# Patient Record
Sex: Female | Born: 1990 | Hispanic: No | Marital: Single | Smoking: Never smoker
Health system: Southern US, Community
[De-identification: ages and names within clinical notes are randomized; demographics above are authoritative.]

## PROBLEM LIST (undated history)

## (undated) ENCOUNTER — Inpatient Hospital Stay (HOSPITAL_COMMUNITY): Payer: Self-pay

## (undated) DIAGNOSIS — R112 Nausea with vomiting, unspecified: Secondary | ICD-10-CM

## (undated) DIAGNOSIS — J45909 Unspecified asthma, uncomplicated: Secondary | ICD-10-CM

## (undated) DIAGNOSIS — D649 Anemia, unspecified: Secondary | ICD-10-CM

## (undated) DIAGNOSIS — D6800 Von Willebrand disease, unspecified: Secondary | ICD-10-CM

## (undated) DIAGNOSIS — T8859XA Other complications of anesthesia, initial encounter: Secondary | ICD-10-CM

## (undated) DIAGNOSIS — F419 Anxiety disorder, unspecified: Secondary | ICD-10-CM

## (undated) DIAGNOSIS — S0990XA Unspecified injury of head, initial encounter: Secondary | ICD-10-CM

## (undated) DIAGNOSIS — Z9889 Other specified postprocedural states: Secondary | ICD-10-CM

## (undated) DIAGNOSIS — T4145XA Adverse effect of unspecified anesthetic, initial encounter: Secondary | ICD-10-CM

## (undated) DIAGNOSIS — Z8619 Personal history of other infectious and parasitic diseases: Secondary | ICD-10-CM

## (undated) HISTORY — DX: Von Willebrand's disease: D68.0

## (undated) HISTORY — DX: Von Willebrand disease, unspecified: D68.00

## (undated) HISTORY — DX: Personal history of other infectious and parasitic diseases: Z86.19

---
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
## 2013-11-12 ENCOUNTER — Emergency Department (HOSPITAL_COMMUNITY)
Admission: EM | Admit: 2013-11-12 | Discharge: 2013-11-13 | Disposition: A | Discharge status: Home / Self Care | Payer: BC Managed Care – PPO | Attending: Emergency Medicine | Admitting: Emergency Medicine

## 2013-11-12 ENCOUNTER — Encounter (HOSPITAL_COMMUNITY): Payer: Self-pay | Admitting: Emergency Medicine

## 2013-11-12 ENCOUNTER — Emergency Department (HOSPITAL_COMMUNITY): Payer: BC Managed Care – PPO

## 2013-11-12 DIAGNOSIS — J069 Acute upper respiratory infection, unspecified: Secondary | ICD-10-CM | POA: Insufficient documentation

## 2013-11-12 DIAGNOSIS — R5383 Other fatigue: Secondary | ICD-10-CM

## 2013-11-12 DIAGNOSIS — Z3202 Encounter for pregnancy test, result negative: Secondary | ICD-10-CM | POA: Insufficient documentation

## 2013-11-12 DIAGNOSIS — R000 Tachycardia, unspecified: Secondary | ICD-10-CM | POA: Insufficient documentation

## 2013-11-12 DIAGNOSIS — J45909 Unspecified asthma, uncomplicated: Secondary | ICD-10-CM | POA: Insufficient documentation

## 2013-11-12 DIAGNOSIS — Z79899 Other long term (current) drug therapy: Secondary | ICD-10-CM | POA: Insufficient documentation

## 2013-11-12 DIAGNOSIS — R0789 Other chest pain: Secondary | ICD-10-CM

## 2013-11-12 DIAGNOSIS — R071 Chest pain on breathing: Secondary | ICD-10-CM | POA: Insufficient documentation

## 2013-11-12 DIAGNOSIS — R5381 Other malaise: Secondary | ICD-10-CM | POA: Insufficient documentation

## 2013-11-12 HISTORY — DX: Unspecified asthma, uncomplicated: J45.909

## 2013-11-12 LAB — CBC
HEMATOCRIT: 35 % — AB (ref 36.0–46.0)
HEMOGLOBIN: 11.9 g/dL — AB (ref 12.0–15.0)
MCH: 29.8 pg (ref 26.0–34.0)
MCHC: 34 g/dL (ref 30.0–36.0)
MCV: 87.5 fL (ref 78.0–100.0)
Platelets: 297 10*3/uL (ref 150–400)
RBC: 4 MIL/uL (ref 3.87–5.11)
RDW: 12.3 % (ref 11.5–15.5)
WBC: 14.4 10*3/uL — ABNORMAL HIGH (ref 4.0–10.5)

## 2013-11-12 LAB — BASIC METABOLIC PANEL
BUN: 11 mg/dL (ref 6–23)
CO2: 19 meq/L (ref 19–32)
Calcium: 8.6 mg/dL (ref 8.4–10.5)
Chloride: 103 mEq/L (ref 96–112)
Creatinine, Ser: 0.66 mg/dL (ref 0.50–1.10)
GFR calc Af Amer: >90 mL/min (ref >90)
GLUCOSE: 97 mg/dL (ref 70–99)
POTASSIUM: 3.3 meq/L — AB (ref 3.7–5.3)
Sodium: 137 mEq/L (ref 137–147)

## 2013-11-12 LAB — POCT I-STAT TROPONIN I: TROPONIN I, POC: 0.01 ng/mL (ref 0.00–0.08)

## 2013-11-12 LAB — D-DIMER, QUANTITATIVE: D-Dimer, Quant: 0.43 ug/mL-FEU (ref 0.00–0.48)

## 2013-11-12 LAB — POCT PREGNANCY, URINE: Preg Test, Ur: NEGATIVE

## 2013-11-12 MED ORDER — IBUPROFEN 400 MG PO TABS
600.0000 mg | ORAL_TABLET | Freq: Once | ORAL | Status: AC
  Administered 2013-11-12: 600 mg via ORAL
  Filled 2013-11-12 (×2): qty 1

## 2013-11-12 MED ORDER — DEXAMETHASONE 6 MG PO TABS
10.0000 mg | ORAL_TABLET | ORAL | Status: AC
  Administered 2013-11-13: 10 mg via ORAL
  Filled 2013-11-12: qty 1

## 2013-11-12 NOTE — ED Notes (Signed)
Patient returned from XRay

## 2013-11-12 NOTE — ED Notes (Signed)
Pt. reports mid chest pain onset today worse with deep inspiration , denies SOB /nausea or diaphoresis .

## 2013-11-12 NOTE — ED Notes (Signed)
MD at bedside. (ER Physician).  MD updated the patient on the plan of care.

## 2013-11-12 NOTE — Discharge Instructions (Signed)
Take 4 puffs of albuterol every 4 hours for the next 24 hours. If you have worsening symptoms return to the ED. You can take Motrin for pain.  Asthma, Adult Asthma is a recurring condition in which the airways tighten and narrow. Asthma can make it difficult to breathe. It can cause coughing, wheezing, and shortness of breath. Asthma episodes (also called asthma attacks) range from minor to life-threatening. Asthma cannot be cured, but medicines and lifestyle changes can help control it. CAUSES Asthma is believed to be caused by inherited (genetic) and environmental factors, but its exact cause is unknown. Asthma may be triggered by allergens, lung infections, or irritants in the air. Asthma triggers are different for each person. Common triggers include:   Animal dander.  Dust mites.  Cockroaches.  Pollen from trees or grass.  Mold.  Smoke.  Air pollutants such as dust, household cleaners, hair sprays, aerosol sprays, paint fumes, strong chemicals, or strong odors.  Cold air, weather changes, and winds (which increase molds and pollens in the air).  Strong emotional expressions such as crying or laughing hard.  Stress.  Certain medicines (such as aspirin) or types of drugs (such as beta-blockers).  Sulfites in foods and drinks. Foods and drinks that may contain sulfites include dried fruit, potato chips, and sparkling grape juice.  Infections or inflammatory conditions such as the flu, a cold, or an inflammation of the nasal membranes (rhinitis).  Gastroesophageal reflux disease (GERD).  Exercise or strenuous activity. SYMPTOMS Symptoms may occur immediately after asthma is triggered or many hours later. Symptoms include:  Wheezing.  Excessive nighttime or early morning coughing.  Frequent or severe coughing with a common cold.  Chest tightness.  Shortness of breath. DIAGNOSIS  The diagnosis of asthma is made by a review of your medical history and a physical exam.  Tests may also be performed. These may include:  Lung function studies. These tests show how much air you breath in and out.  Allergy tests.  Imaging tests such as X-rays. TREATMENT  Asthma cannot be cured, but it can usually be controlled. Treatment involves identifying and avoiding your asthma triggers. It also involves medicines. There are 2 classes of medicine used for asthma treatment:   Controller medicines. These prevent asthma symptoms from occurring. They are usually taken every day.  Reliever or rescue medicines. These quickly relieve asthma symptoms. They are used as needed and provide short-term relief. Your health care provider will help you create an asthma action plan. An asthma action plan is a written plan for managing and treating your asthma attacks. It includes a list of your asthma triggers and how they may be avoided. It also includes information on when medicines should be taken and when their dosage should be changed. An action plan may also involve the use of a device called a peak flow meter. A peak flow meter measures how well the lungs are working. It helps you monitor your condition. HOME CARE INSTRUCTIONS   Take medicine as directed by your health care provider. Speak with your health care provider if you have questions about how or when to take the medicines.  Use a peak flow meter as directed by your health care provider. Record and keep track of readings.  Understand and use the action plan to help minimize or stop an asthma attack without needing to seek medical care.  Control your home environment in the following ways to help prevent asthma attacks:  Do not smoke. Avoid being exposed to  secondhand smoke.  Change your heating and air conditioning filter regularly.  Limit your use of fireplaces and wood stoves.  Get rid of pests (such as roaches and mice) and their droppings.  Throw away plants if you see mold on them.  Clean your floors and dust  regularly. Use unscented cleaning products.  Try to have someone else vacuum for you regularly. Stay out of rooms while they are being vacuumed and for a short while afterward. If you vacuum, use a dust mask from a hardware store, a double-layered or microfilter vacuum cleaner bag, or a vacuum cleaner with a HEPA filter.  Replace carpet with wood, tile, or vinyl flooring. Carpet can trap dander and dust.  Use allergy-proof pillows, mattress covers, and box spring covers.  Wash bed sheets and blankets every week in hot water and dry them in a dryer.  Use blankets that are made of polyester or cotton.  Clean bathrooms and kitchens with bleach. If possible, have someone repaint the walls in these rooms with mold-resistant paint. Keep out of the rooms that are being cleaned and painted.  Wash hands frequently. SEEK MEDICAL CARE IF:   You have wheezing, shortness of breath, or a cough even if taking medicine to prevent attacks.  The colored mucus you cough up (sputum) is thicker than usual.  Your sputum changes from clear or white to yellow, green, gray, or bloody.  You have any problems that may be related to the medicines you are taking (such as a rash, itching, swelling, or trouble breathing).  You are using a reliever medicine more than 2 3 times per week.  Your peak flow is still at 50 79% of you personal best after following your action plan for 1 hour. SEEK IMMEDIATE MEDICAL CARE IF:   You seem to be getting worse and are unresponsive to treatment during an asthma attack.  You are short of breath even at rest.  You get short of breath when doing very little physical activity.  You have difficulty eating, drinking, or talking due to asthma symptoms.  You develop chest pain.  You develop a fast heartbeat.  You have a bluish color to your lips or fingernails.  You are lightheaded, dizzy, or faint.  Your peak flow is less than 50% of your personal best.  You have a fever  or persistent symptoms for more than 2 3 days.  You have a fever and symptoms suddenly get worse. MAKE SURE YOU:   Understand these instructions.  Will watch your condition.  Will get help right away if you are not doing well or get worse. Document Released: 09/26/2005 Document Revised: 05/29/2013 Document Reviewed: 04/25/2013 [facility] Patient Information 2014 [place], [place].

## 2013-11-12 NOTE — ED Notes (Signed)
Patient transported to X-ray 

## 2013-11-12 NOTE — ED Provider Notes (Signed)
CSN: [id number]     Arrival date & time 11/12/13  2054 History   First MD Initiated Contact with Patient 11/12/13 2107     Chief Complaint  Patient presents with  . Chest Pain    HPI: Ms. Weston SettleWise is a 23 yo F with history of asthma who presents with cough, chest pain and sore throat. Symptoms started this morning when she woke up with a sore throat. She started to cough shortly after this. Her cough has been non-productive. After coughing for a few hours she began to have chest pain that she described as tightness, non-radiating, intermittent, worse with deep inspiration and coughing. No relieving factors. Pain is currently 4/10, she did not take anything at home for the pain. She has been eating, drinking fluids and urinating normally. She denies any SOB, her chest pain is not exertional in nature. No sick contacts. She has no history of VTE, does take OCP's but no leg swelling. No family history of early cardiac death.    Past Medical History  Diagnosis Date  . Asthma    Past Surgical History  Procedure Laterality Date  . Hand surgery     No family history on file. History  Substance Use Topics  . Smoking status: Never Smoker   . Smokeless tobacco: Not on file  . Alcohol Use: No   OB History   Grav Para Term Preterm Abortions TAB SAB Ect Mult Living                 Review of Systems  Constitutional: Positive for fatigue. Negative for fever, chills and appetite change.  HENT: Positive for congestion and sore throat.   Eyes: Negative for photophobia and visual disturbance.  Respiratory: Positive for cough. Negative for shortness of breath.   Cardiovascular: Positive for chest pain. Negative for leg swelling.  Gastrointestinal: Negative for nausea, vomiting, abdominal pain, diarrhea and constipation.  Genitourinary: Negative for dysuria, frequency and decreased urine volume.  Musculoskeletal: Negative for arthralgias, back pain, gait problem and myalgias.  Skin: Negative for color  change and wound.  Neurological: Negative for dizziness, syncope, light-headedness and headaches.  Psychiatric/Behavioral: Negative for confusion and agitation.  All other systems reviewed and are negative.    Allergies  Review of patient's allergies indicates no known allergies.  Home Medications   Current Outpatient Rx  Name  Route  Sig  Dispense  Refill  . albuterol (PROVENTIL HFA;VENTOLIN HFA) 108 (90 BASE) MCG/ACT inhaler   Inhalation   Inhale 2 puffs into the lungs every 6 (six) hours as needed for wheezing or shortness of breath.         . norgestimate-ethinyl estradiol (ORTHO-CYCLEN,SPRINTEC,PREVIFEM) 0.25-35 MG-MCG tablet   Oral   Take 1 tablet by mouth daily.          BP 116/68  Pulse 93  Temp(Src) 99.4 F (37.4 C) (Oral)  Resp 16  SpO2 100%  LMP 11/12/2013 Physical Exam  Nursing note and vitals reviewed. Constitutional: She is oriented to person, place, and time. She appears well-developed and well-nourished. No distress.  HENT:  Head: Normocephalic and atraumatic.  Mouth/Throat: Oropharynx is clear and moist.  Eyes: Conjunctivae and EOM are normal. Pupils are equal, round, and reactive to light.  Neck: Normal range of motion. Neck supple.  Cardiovascular: Regular rhythm, normal heart sounds and intact distal pulses.  Tachycardia present.   Pulmonary/Chest: Effort normal and breath sounds normal. No respiratory distress. She exhibits bony tenderness (over anterior chest).  Abdominal: Soft. Bowel  sounds are normal. There is no tenderness. There is no rebound and no guarding.  Musculoskeletal: Normal range of motion. She exhibits no edema and no tenderness.  Neurological: She is alert and oriented to person, place, and time. No cranial nerve deficit. Coordination normal.  Skin: Skin is warm and dry. No rash noted.  Psychiatric: She has a normal mood and affect. Her behavior is normal.    ED Course  Procedures (including critical care time) Labs  Review Labs Reviewed  CBC - Abnormal; Notable for the following:    WBC 14.4 (*)    Hemoglobin 11.9 (*)    HCT 35.0 (*)    All other components within normal limits  BASIC METABOLIC PANEL - Abnormal; Notable for the following:    Potassium 3.3 (*)    All other components within normal limits  D-DIMER, QUANTITATIVE  POCT PREGNANCY, URINE  POCT I-STAT TROPONIN I   Imaging Review Dg Chest 2 View  11/12/2013   CLINICAL DATA:  Chest pain.  EXAM: CHEST  2 VIEW  COMPARISON:  PA and lateral chest 08/18/2010.  FINDINGS: Heart size and mediastinal contours are within normal limits. Both lungs are clear. Visualized skeletal structures are unremarkable.  IMPRESSION: Negative exam.   Electronically Signed   By: Drusilla Kanner M.D.   On: 11/12/2013 22:00    EKG Interpretation   Date and Time: 10/12/2013 2136 Vent rate: 96 PRI: 140 QRS: 90 QT: 336 QTc: 425 R: 54 Interpretation: SR, no significant ST or T wave abnormalities.        MDM   23 yo F with history of asthma presents with URI symptoms and chest wall pain. Considered PE given chest pain and tachycardia on arrival. She is low risk by [employer] (1.5 points) so obtained d-dimer which was negative, no CTA indicated. CXR without consolidation, PTX or effusion. ECG without ischemic changes, doubt ACS. Mild leukocytosis, WBC 14.4, likely secondary to viral URI. Hgb 11.9, K 3.3, other lytes normal. UPT negative. Treated with Motrin, with improvement of her symptoms. Patient likely has a viral URI. She was given dose of decadron, given history of asthma. Advised to use albuterol 4 puffs every 4 hours for the next 24 hours. Advised PCP f/u in two days if not better. Return precautions given. She was in agreement with plan.   Reviewed imaging, ECG, labs and previous medical records, utilized in MDM  Discussed case with Dr. Patria Mane.   Clinical Impression 1. Upper respiratory infection 2. Chest wall pain    Margie Billet, MD 11/13/13 [phone]

## 2013-11-12 NOTE — ED Notes (Signed)
Repeat EKG shown to Dr. Freida BusmanAllen.

## 2013-11-14 NOTE — ED Provider Notes (Signed)
I saw and evaluated the patient, reviewed the resident's note and I agree with the findings and plan.  I personally evaluated the ECG and agree with the interpretation of the resident  Feeling better at this time. D dimer neg. Doubt ACS  Dg Chest 2 View  11/12/2013   CLINICAL DATA:  Chest pain.  EXAM: CHEST  2 VIEW  COMPARISON:  PA and lateral chest 08/18/2010.  FINDINGS: Heart size and mediastinal contours are within normal limits. Both lungs are clear. Visualized skeletal structures are unremarkable.  IMPRESSION: Negative exam.   Electronically Signed   By: Drusilla Kannerhomas  Dalessio M.D.   On: 11/12/2013 22:00  I personally reviewed the imaging tests through PACS system I reviewed available ER/hospitalization records through the EMR Results for orders placed during the hospital encounter of 11/12/13  CBC      Result Value Range   WBC 14.4 (*) 4.0 - 10.5 K/uL   RBC 4.00  3.87 - 5.11 MIL/uL   Hemoglobin 11.9 (*) 12.0 - 15.0 g/dL   HCT 40.935.0 (*) 81.136.0 - 91.446.0 %   MCV 87.5  78.0 - 100.0 fL   MCH 29.8  26.0 - 34.0 pg   MCHC 34.0  30.0 - 36.0 g/dL   RDW 78.212.3  95.611.5 - 21.315.5 %   Platelets 297  150 - 400 K/uL  BASIC METABOLIC PANEL      Result Value Range   Sodium 137  137 - 147 mEq/L   Potassium 3.3 (*) 3.7 - 5.3 mEq/L   Chloride 103  96 - 112 mEq/L   CO2 19  19 - 32 mEq/L   Glucose, Bld 97  70 - 99 mg/dL   BUN 11  6 - 23 mg/dL   Creatinine, Ser 0.860.66  0.50 - 1.10 mg/dL   Calcium 8.6  8.4 - 57.810.5 mg/dL   GFR calc non Af Amer >90  >90 mL/min   GFR calc Af Amer >90  >90 mL/min  D-DIMER, QUANTITATIVE      Result Value Range   D-Dimer, Quant 0.43  0.00 - 0.48 ug/mL-FEU  POCT PREGNANCY, URINE      Result Value Range   Preg Test, Ur NEGATIVE  NEGATIVE  POCT I-STAT TROPONIN I      Result Value Range   Troponin i, poc 0.01  0.00 - 0.08 ng/mL   Comment 3                    Lyanne CoKevin M Tymel Conely, MD 11/14/13 [phone]

## 2015-03-16 ENCOUNTER — Encounter (HOSPITAL_COMMUNITY): Payer: Self-pay | Admitting: Emergency Medicine

## 2015-03-16 ENCOUNTER — Emergency Department (INDEPENDENT_AMBULATORY_CARE_PROVIDER_SITE_OTHER)
Admission: EM | Admit: 2015-03-16 | Discharge: 2015-03-16 | Disposition: A | Discharge status: Home / Self Care | Payer: No Typology Code available for payment source | Source: Home / Self Care | Attending: Family Medicine | Admitting: Family Medicine

## 2015-03-16 DIAGNOSIS — L02416 Cutaneous abscess of left lower limb: Secondary | ICD-10-CM

## 2015-03-16 MED ORDER — SULFAMETHOXAZOLE-TRIMETHOPRIM 800-160 MG PO TABS: 1.0000 | ORAL_TABLET | Freq: Two times a day (BID) | ORAL | Status: AC

## 2015-03-16 NOTE — Discharge Instructions (Signed)
Abscess READ THIS Warm Compresses frequently Septra as directed  An abscess (boil or furuncle) is an infected area on or under the skin. This area is filled with yellowish-white fluid (pus) and other material (debris). HOME CARE   Only take medicines as told by your doctor.  If you were given antibiotic medicine, take it as directed. Finish the medicine even if you start to feel better.  If gauze is used, follow your doctor's directions for changing the gauze.  To avoid spreading the infection:  Keep your abscess covered with a bandage.  Wash your hands well.  Do not share personal care items, towels, or whirlpools with others.  Avoid skin contact with others.  Keep your skin and clothes clean around the abscess.  Keep all doctor visits as told. GET HELP RIGHT AWAY IF:   You have more pain, puffiness (swelling), or redness in the wound site.  You have more fluid or blood coming from the wound site.  You have muscle aches, chills, or you feel sick.  You have a fever. MAKE SURE YOU:   Understand these instructions.  Will watch your condition.  Will get help right away if you are not doing well or get worse. Document Released: 03/14/2008 Document Revised: 03/27/2012 Document Reviewed: 12/09/2011 [facility] Patient Information 2015 [place], [place]. This information is not intended to replace advice given to you by your health care provider. Make sure you discuss any questions you have with your health care provider.  MRSA Infection MRSA stands for methicillin-resistant Staphylococcus aureus. This type of infection is caused by Staphylococcus aureus bacteria that are no longer affected by the medicines used to kill them (drug resistant). Staphylococcus (staph) bacteria are normally found on the skin or in the nose of healthy people. In most cases, these bacteria do not cause infection. But if these resistant bacteria enter your body through a cut or sore, they can cause a  serious infection on your skin or in other parts of your body. There is a slight chance that the staph on your skin or in your nose is MRSA. There are two types of MRSA infections:  Hospital-acquired MRSA is bacteria that you get in the hospital.  Community-acquired MRSA is bacteria that you get somewhere other than in a hospital. RISK FACTORS Hospital-acquired MRSA is more common. You could be at risk for this infection if you are in the hospital and you:  Have surgery or a procedure.  Have an IV access or a catheter tube placed in your body.  Have weak resistance to germs (weakened immune system).  Are elderly.  Are on kidney dialysis. You could be at risk for community-acquired MRSA if you have a break in your skin and come into contact with MRSA. This may happen if you:  Play sports where there is skin-to-skin contact.  Live in a crowded setting, like a dormitory or a [employer] barracks.  Share towels, razors, or sports equipment with other people. SYMPTOMS  Symptoms of hospital-acquired MRSA depend on where MRSA has spread. Symptoms may include:  Wound infection.  Skin infection.  Rash.  Pneumonia.  Fever and chills.  Difficulty breathing.  Chest pain. Community-acquired MRSA is most likely to start as a scratch or cut that becomes infected. Symptoms may include:  A pus-filled pimple.  A boil on your skin.  Pus draining from your skin.  A sore (abscess) under your skin or somewhere in your body.  Fever with or without chills. DIAGNOSIS  The diagnosis of  MRSA is made by taking a sample from an infected area and sending it to a lab for testing. A lab technician can grow (culture) MRSA and check it under a microscope. The cultured MRSA can be tested to see which type of antibiotic medicine will work to treat it. Newer tests can identify MRSA more quickly by testing bacteria samples for MRSA genes. Your health care provider can diagnose MRSA using samples from:     Cuts or wounds in infected areas.  Nasal swabs.  Saliva or cough specimens from deep in the lungs (sputum).  Urine.  Blood. You may also have:  Imaging studies (such as X-ray or MRI) to check if the infection has spread to the lungs, bones, or joints.  A culture and sensitivity test of blood or fluids from inside the joints. TREATMENT  Treatment depends on how severe, deep, or extensive the infection is. Very bad infections may require a hospital stay.  Some skin infections, such as a small boil or sore (abscess), may be treated by draining pus from the site of the infection.  More extensive surgery to drain pus may be necessary for deeper or more widespread soft tissue infections.  You may then have to take antibiotic medicine given by mouth or through a vein. You may start antibiotic treatment right away or after testing can be done to see what antibiotic medicine should be used. HOME CARE INSTRUCTIONS   Take your antibiotics as directed by your health care provider. Take the medicine as prescribed until it is finished.  Avoid close contact with those around you as much as possible. Do not use towels, razors, toothbrushes, bedding, or other items that will be used by others.  Wash your hands frequently for 15 seconds with soap and water. Dry your hands with a clean or disposable towel.  When you are not able to wash your hands, use hand sanitizer that is more than 60 percent alcohol.  Wash towels, sheets, or clothes in the washing machine with detergent and hot water. Dry them in a hot dryer.  Follow your health care provider's instructions for wound care. Wash your hands before and after changing your bandages.  Always shower after exercising.  Keep all cuts and scrapes clean and covered with a bandage.  Be sure to tell all your health care providers that you have MRSA so they are aware of your infection. SEEK MEDICAL CARE IF:  You have a cut, scrape, pimple, or boil  that becomes red, swollen, or painful or has pus in it.  You have pus draining from your skin.  You have an abscess under your skin or somewhere in your body. SEEK IMMEDIATE MEDICAL CARE IF:   You have symptoms of a skin infection with a fever or chills.  You have trouble breathing.  You have chest pain.  You have a skin wound and you become nauseous or start vomiting. MAKE SURE YOU:  Understand these instructions.  Will watch your condition.  Will get help right away if you are not doing well or get worse. Document Released: 09/26/2005 Document Revised: 10/01/2013 Document Reviewed: 07/19/2013 [facility] Patient Information 2015 [employer], [place]. This information is not intended to replace advice given to you by your health care provider. Make sure you discuss any questions you have with your health care provider.  Incision and Drainage Incision and drainage is a procedure in which a sac-like structure (cystic structure) is opened and drained. The area to be drained usually contains material  such as pus, fluid, or blood.  LET YOUR CAREGIVER KNOW ABOUT:   Allergies to medicine.  Medicines taken, including vitamins, herbs, eyedrops, over-the-counter medicines, and creams.  Use of steroids (by mouth or creams).  Previous problems with anesthetics or numbing medicines.  History of bleeding problems or blood clots.  Previous surgery.  Other health problems, including diabetes and kidney problems.  Possibility of pregnancy, if this applies. RISKS AND COMPLICATIONS  Pain.  Bleeding.  Scarring.  Infection. BEFORE THE PROCEDURE  You may need to have an ultrasound or other imaging tests to see how large or deep your cystic structure is. Blood tests may also be used to determine if you have an infection or how severe the infection is. You may need to have a tetanus shot. PROCEDURE  The affected area is cleaned with a cleaning fluid. The cyst area will then be numbed with a  medicine (local anesthetic). A small incision will be made in the cystic structure. A syringe or catheter may be used to drain the contents of the cystic structure, or the contents may be squeezed out. The area will then be flushed with a cleansing solution. After cleansing the area, it is often gently packed with a gauze or another wound dressing. Once it is packed, it will be covered with gauze and tape or some other type of wound dressing. AFTER THE PROCEDURE   Often, you will be allowed to go home right after the procedure.  You may be given antibiotic medicine to prevent or heal an infection.  If the area was packed with gauze or some other wound dressing, you will likely need to come back in 1 to 2 days to get it removed.  The area should heal in about 14 days. Document Released: 03/22/2001 Document Revised: 03/27/2012 Document Reviewed: 11/21/2011 [facility] Patient Information 2015 [place], [place]. This information is not intended to replace advice given to you by your health care provider. Make sure you discuss any questions you have with your health care provider.

## 2015-03-16 NOTE — ED Notes (Signed)
C/o abscess for two weeks on left thigh States area opened on Friday and drained Area is still sore and red Home care and warm compresses done as tx

## 2015-03-16 NOTE — ED Provider Notes (Signed)
CSN: [id number]     Arrival date & time 03/16/15  1700 History   First MD Initiated Contact with Patient 03/16/15 1801     Chief Complaint  Patient presents with  . Abscess   (Consider location/radiation/quality/duration/timing/severity/associated sxs/prior Treatment) HPI Comments: 24 year old female presents with an open sore to the left thigh. She states it began as small pimple then developed into a larger red painful area. She says that became much larger within the past 3-4 days. 2 days ago it spontaneously ruptured and drained pus. The area of redness is much smaller now and the drainage has slowed down but continues. She states this is the fifth occurrence of abscess-like structures in the past few months.   Past Medical History  Diagnosis Date  . Asthma    Past Surgical History  Procedure Laterality Date  . Hand surgery     History reviewed. No pertinent family history. History  Substance Use Topics  . Smoking status: Never Smoker   . Smokeless tobacco: Not on file  . Alcohol Use: No   OB History    No data available     Review of Systems  Constitutional: Negative.   Musculoskeletal: Negative.   Skin:       As per history of present illness  All other systems reviewed and are negative.   Allergies  Review of patient's allergies indicates no known allergies.  Home Medications   Prior to Admission medications   Medication Sig Start Date End Date Taking? Authorizing Provider  albuterol (PROVENTIL HFA;VENTOLIN HFA) 108 (90 BASE) MCG/ACT inhaler Inhale 2 puffs into the lungs every 6 (six) hours as needed for wheezing or shortness of breath.    Historical Provider, MD  norgestimate-ethinyl estradiol (ORTHO-CYCLEN,SPRINTEC,PREVIFEM) 0.25-35 MG-MCG tablet Take 1 tablet by mouth daily.    Historical Provider, MD  sulfamethoxazole-trimethoprim (BACTRIM DS,SEPTRA DS) 800-160 MG per tablet Take 1 tablet by mouth 2 (two) times daily. 03/16/15 03/23/15  Hayden Rasmussenavid Shail Urbas, NP   BP  124/82 mmHg  Pulse 69  Temp(Src) 98 F (36.7 C) (Oral)  Resp 16  SpO2 99%  LMP 03/03/2015 Physical Exam  Constitutional: She is oriented to person, place, and time. She appears well-developed and well-nourished.  Neck: Normal range of motion. Neck supple.  Pulmonary/Chest: No respiratory distress.  Musculoskeletal: Normal range of motion. She exhibits no edema.  Neurological: She is alert and oriented to person, place, and time.  Skin: Skin is warm and dry.  Proximate 2 .5 cm open lesion to the left medial thigh. It is currently draining a small amount of thick pus. There is approximately 4 cm of mild erythema and superficial induration surrounding this wound. No lymphangitis.  Psychiatric: She has a normal mood and affect.  Nursing note and vitals reviewed.   ED Course  Procedures (including critical care time) Labs Review Labs Reviewed - No data to display  Imaging Review No results found.   MDM   1. Abscess of left thigh    Septra ds as directed Warm compreses Culture pending MRSA info     Hayden Rasmussenavid Tanika Bracco, NP 03/16/15 1900

## 2015-03-19 LAB — CULTURE, ROUTINE-ABSCESS

## 2015-03-19 NOTE — ED Notes (Signed)
Patient called to inquire about her lab findings, and after verifying ID, discussed positive staph findings. Advised this Rx should work well with the organism isolated on culture, and she should continue the Rx until completed, and continue the warm compresses

## 2015-08-10 ENCOUNTER — Emergency Department (INDEPENDENT_AMBULATORY_CARE_PROVIDER_SITE_OTHER)
Admission: EM | Admit: 2015-08-10 | Discharge: 2015-08-10 | Disposition: A | Discharge status: Home / Self Care | Payer: No Typology Code available for payment source | Source: Home / Self Care | Attending: Family Medicine | Admitting: Family Medicine

## 2015-08-10 ENCOUNTER — Encounter (HOSPITAL_COMMUNITY): Payer: Self-pay | Admitting: Emergency Medicine

## 2015-08-10 ENCOUNTER — Emergency Department (INDEPENDENT_AMBULATORY_CARE_PROVIDER_SITE_OTHER): Payer: No Typology Code available for payment source

## 2015-08-10 DIAGNOSIS — S96912A Strain of unspecified muscle and tendon at ankle and foot level, left foot, initial encounter: Secondary | ICD-10-CM

## 2015-08-10 DIAGNOSIS — S63602A Unspecified sprain of left thumb, initial encounter: Secondary | ICD-10-CM

## 2015-08-10 DIAGNOSIS — S96911A Strain of unspecified muscle and tendon at ankle and foot level, right foot, initial encounter: Secondary | ICD-10-CM

## 2015-08-10 NOTE — ED Provider Notes (Signed)
CSN: [id number]     Arrival date & time 08/10/15  1303 History   First MD Initiated Contact with Patient 08/10/15 1332     Chief Complaint  Patient presents with  . Hand Pain  . Foot Pain   (Consider location/radiation/quality/duration/timing/severity/associated sxs/prior Treatment) HPI Comments: 24 year old female was playing football yesterday and somehow injured her left thumb. She is on aware of the exact mechanism of injury. She is complaining of pain at the base of thumb, over the MCP and thenar eminence. She demonstrates good range of motion. There is no deformity.  Second complaint is that of bilateral feet pain. This is been going on for at least 2 weeks. A couple weeks prior to the onset of pain she started a new job where she is on her feet 8 hours a day. The pain is primarily to the dorsum of the feet just distal to the malleolus. Denies any known injury. Denies pain to the plantar aspect. Denies pain to the ankles. No known injury.   Past Medical History  Diagnosis Date  . Asthma    Past Surgical History  Procedure Laterality Date  . Hand surgery     No family history on file. Social History  Substance Use Topics  . Smoking status: Never Smoker   . Smokeless tobacco: None  . Alcohol Use: No   OB History    No data available     Review of Systems  Constitutional: Negative.  Negative for fever, chills and activity change.  HENT: Negative.   Musculoskeletal: Negative for joint swelling.       As per HPI  Skin: Negative for color change, pallor and rash.  Neurological: Negative.     Allergies  Review of patient's allergies indicates no known allergies.  Home Medications   Prior to Admission medications   Medication Sig Start Date End Date Taking? Authorizing Provider  albuterol (PROVENTIL HFA;VENTOLIN HFA) 108 (90 BASE) MCG/ACT inhaler Inhale 2 puffs into the lungs every 6 (six) hours as needed for wheezing or shortness of breath.    Historical Provider, MD   norgestimate-ethinyl estradiol (ORTHO-CYCLEN,SPRINTEC,PREVIFEM) 0.25-35 MG-MCG tablet Take 1 tablet by mouth daily.    Historical Provider, MD   Meds Ordered and Administered this Visit  Medications - No data to display  BP 92/61 mmHg  Pulse 84  Temp(Src) 98.4 F (36.9 C) (Oral)  Resp 16  SpO2 100%  LMP 07/19/2015 No data found.   Physical Exam  Constitutional: She is oriented to person, place, and time. She appears well-developed and well-nourished. No distress.  HENT:  Head: Normocephalic and atraumatic.  Eyes: EOM are normal.  Neck: Normal range of motion. Neck supple.  Cardiovascular: Normal rate.   Pulmonary/Chest: Effort normal. No respiratory distress.  Musculoskeletal:  Left hand with minimal swelling to the MCP P joint of the thumb. Opposition is complete. Full range of motion. Tenderness directly over the MCP. There is a scar overlying this area for which she had previous surgery. Tenderness over the thenar eminence. Distal neurovascular and motor sensory is intact.  Bilateral feet with minor puffiness to the dorsum of the feet just distal to the lateral malleolus. No deformity. Positive tenderness with compression over the metatarsals and tarsals. No deformity. No discoloration.   Neurological: She is alert and oriented to person, place, and time. No cranial nerve deficit.  Skin: Skin is warm and dry.  Nursing note and vitals reviewed.   ED Course  Procedures (including critical care time)  Labs  Review Labs Reviewed - No data to display  Imaging Review Dg Hand Complete Left  08/10/2015  CLINICAL DATA:  Injury. EXAM: LEFT HAND - COMPLETE 3+ VIEW COMPARISON:  None. FINDINGS: There is no evidence of fracture or dislocation. There is no evidence of arthropathy or other focal bone abnormality. Soft tissues are unremarkable. IMPRESSION: No acute abnormality identified. No evidence of fracture or dislocation. Electronically Signed   By: Maisie Fus  Register   On: 08/10/2015  13:58     Visual Acuity Review  Right Eye Distance:   Left Eye Distance:   Bilateral Distance:    Right Eye Near:   Left Eye Near:    Bilateral Near:         MDM   1. Thumb sprain, left, initial encounter   2. Right foot strain, initial encounter   3. Strain of foot, left, initial encounter    splint left thumb and position of function, ice to the thumb. Ice to feet as needed. Wear comfortable fitting shoes. Where an elastic foot brace to both feet for the next several days as needed.   It is noted that despite having pain in both feet for over 2 weeks that she was able to run and play football yesterday with minimal discomfort.    Hayden Rasmussen, NP 08/10/15 1425

## 2015-08-10 NOTE — Discharge Instructions (Signed)
Thumb Sprain A thumb sprain is an injury to one of the strong bands of tissue (ligaments) that connect the bones in your thumb. The ligament can be stretched too much or it can tear. A tear can be either partial or complete. The severity of the sprain depends on how much of the ligament was damaged or torn. CAUSES A thumb sprain is often caused by a fall or an accident. If you extend your hands to catch an object or to protect yourself, the force of the impact can cause your ligament to stretch too much. This excess tension can also cause your ligament to tear. RISK FACTORS This injury is more likely to occur in people who play:  Sports that involve a greater risk of falling, such as skiing.  Sports that involve catching an object, such as basketball. SYMPTOMS Symptoms of this condition include:  Loss of motion in your thumb.  Bruising.  Tenderness.  Swelling. DIAGNOSIS This condition is diagnosed with a medical history and physical exam. You may also have an X-ray of your thumb. TREATMENT Treatment varies depending on the severity of your sprain. If your ligament is overstretched or partially torn, treatment usually involves keeping your thumb in a fixed position (immobilization) for a period of time. To help you do this, your health care provider will apply a bandage, cast, or splint to keep your thumb from moving until it heals. If your ligament is fully torn, you may need surgery to reconnect the ligament to the bone. After surgery, a cast or splint will be applied and will need to stay on your thumb while it heals. Your health care provider may also suggest exercises or physical therapy to strengthen your thumb. HOME CARE INSTRUCTIONS If You Have a Cast:  Do not stick anything inside the cast to scratch your skin. Doing that increases your risk of infection.  Check the skin around the cast every day. Report any concerns to your health care provider. You may put lotion on dry  skin around the edges of the cast. Do not apply lotion to the skin underneath the cast.  Keep the cast clean and dry. If You Have a Splint:  Wear it as directed by your health care provider. Remove it only as directed by your health care provider.  Loosen the splint if your fingers become numb and tingle, or if they turn cold and blue.  Keep the splint clean and dry. Bathing  Cover the bandage, cast, or splint with a watertight plastic bag to protect it from water while you take a bath or a shower. Do not let the bandage, cast, or splint get wet. Managing Pain, Stiffness, and Swelling   If directed, apply ice to the injured area (unless you have a cast):  Put ice in a plastic bag.  Place a towel between your skin and the bag.  Leave the ice on for 20 minutes, 2-3 times per day.  Move your fingers often to avoid stiffness and to lessen swelling.  Raise (elevate) the injured area above the level of your heart while you are sitting or lying down. Driving  Do not drive or operate heavy machinery while taking pain medicine.  Do not drive while wearing a cast or splint on a hand that you use for driving. General Instructions  Do not put pressure on any part of your cast or splint until it is fully hardened. This may take several hours.  Take medicines only as directed by your health  care provider. These include over-the-counter medicines and prescription medicines.  Keep all follow-up visits as directed by your health care provider. This is important.  Do any exercise or physical therapy as directed by your health care provider.  Do not wear rings on your injured thumb. SEEK MEDICAL CARE IF:  Your pain is not controlled with medicine.  Your bruising or swelling gets worse.  Your cast or splint is damaged. SEEK IMMEDIATE MEDICAL CARE IF:  Your thumb is numb or blue.  Your thumb feels colder than normal.   This information is not intended to replace advice given to you  by your health care provider. Make sure you discuss any questions you have with your health care provider.   Document Released: 11/03/2004 Document Revised: 02/10/2015 Document Reviewed: 07/08/2014 Elsevier Interactive Patient Education 2016 [employer].  For foot pain recommend using ice off and particularly after work. Wear comfortable well fitting shoes. Decrease amount of standing time if possible. Advil or Aleve for pain.

## 2015-08-10 NOTE — ED Notes (Signed)
Left thumb pain that occurred sometime yesterday while playing flag football.  Patient has injured this area of her hand in the past.  Patient reports she had surgery on this hand.   Bilateral foot soreness.  The foot soreness has been going on for 2 months.  No specific injury.

## 2015-12-06 ENCOUNTER — Encounter (HOSPITAL_COMMUNITY): Payer: Self-pay | Admitting: Emergency Medicine

## 2015-12-06 ENCOUNTER — Emergency Department (INDEPENDENT_AMBULATORY_CARE_PROVIDER_SITE_OTHER)
Admission: EM | Admit: 2015-12-06 | Discharge: 2015-12-06 | Disposition: A | Discharge status: Home / Self Care | Payer: Self-pay | Source: Home / Self Care | Attending: Emergency Medicine | Admitting: Emergency Medicine

## 2015-12-06 DIAGNOSIS — S61309A Unspecified open wound of unspecified finger with damage to nail, initial encounter: Secondary | ICD-10-CM

## 2015-12-06 MED ORDER — ALBUTEROL SULFATE HFA 108 (90 BASE) MCG/ACT IN AERS: 2.0000 | INHALATION_SPRAY | RESPIRATORY_TRACT | Status: DC | PRN

## 2015-12-06 MED ORDER — TRAMADOL HCL 50 MG PO TABS: 50.0000 mg | ORAL_TABLET | Freq: Four times a day (QID) | ORAL | Status: DC | PRN

## 2015-12-06 MED ORDER — LIDOCAINE HCL (PF) 2 % IJ SOLN
INTRAMUSCULAR | Status: AC
  Filled 2015-12-06: qty 2

## 2015-12-06 NOTE — ED Notes (Signed)
Applied 2x2 and 1 in coban to 5th digit

## 2015-12-06 NOTE — ED Notes (Signed)
Pt reports she inj her left 5th digit/fingernail today while playing flag football States Acrylic fingernail was pulled back... Bleeding around nailbed A&O x4... No acute distress.

## 2015-12-06 NOTE — Discharge Instructions (Signed)
The outside part of the nail has been loosened from the nail bed. Most of the nail is still intact. Please cut the nail short so it doesn't get caught on anything else. Do Epsom salts soaks twice a day and apply a small amount of Neosporin to prevent infection. Take Tylenol or ibuprofen as needed for pain. Use the tramadol as needed for severe pain. Over the next 6 weeks, the nail will grow out, just keep it trimmed short.

## 2015-12-06 NOTE — ED Provider Notes (Signed)
CSN: [id number]     Arrival date & time 12/06/15  1846 History   First MD Initiated Contact with Patient 12/06/15 2009     Chief Complaint  Patient presents with  . Finger Injury   (Consider location/radiation/quality/duration/timing/severity/associated sxs/prior Treatment) HPI She is a 25 year old woman here for evaluation of left little finger nail injury. She is playing flag football this afternoon when her fingernail got caught on something. She states she feels like the nail is loose. It is quite painful. She is wearing acrylic nails.  Past Medical History  Diagnosis Date  . Asthma    Past Surgical History  Procedure Laterality Date  . Hand surgery     No family history on file. Social History  Substance Use Topics  . Smoking status: Never Smoker   . Smokeless tobacco: None  . Alcohol Use: No   OB History    No data available     Review of Systems As in history of present illness Allergies  Review of patient's allergies indicates no known allergies.  Home Medications   Prior to Admission medications   Medication Sig Start Date End Date Taking? Authorizing Provider  albuterol (PROVENTIL HFA;VENTOLIN HFA) 108 (90 Base) MCG/ACT inhaler Inhale 2 puffs into the lungs every 4 (four) hours as needed for wheezing or shortness of breath. 12/06/15   Charm Rings, MD  norgestimate-ethinyl estradiol (ORTHO-CYCLEN,SPRINTEC,PREVIFEM) 0.25-35 MG-MCG tablet Take 1 tablet by mouth daily.    Historical Provider, MD  traMADol (ULTRAM) 50 MG tablet Take 1 tablet (50 mg total) by mouth every 6 (six) hours as needed. 12/06/15   Charm Rings, MD   Meds Ordered and Administered this Visit  Medications - No data to display  BP 118/77 mmHg  Pulse 77  Temp(Src) 98 F (36.7 C) (Oral)  Resp 17  SpO2 100%  LMP 11/26/2015 No data found.   Physical Exam  Constitutional: She is oriented to person, place, and time. She appears well-developed and well-nourished. No distress.   Cardiovascular: Normal rate.   Pulmonary/Chest: Effort normal.  Musculoskeletal:  Left little finger: No erythema or edema. There is some dried blood along the lateral nail edge. Unable to visualize nail itself given acrylic nails. After doing a digital block with 2% lidocaine, the lateral nail edge is loose, but the nail seems to be firmly attached.  Neurological: She is alert and oriented to person, place, and time.    ED Course  Procedures (including critical care time)  Labs Review Labs Reviewed - No data to display  Imaging Review No results found.    MDM   1. Nail avulsion, finger, initial encounter    Partial nail avulsion. Recommended letting this grow out on its own. Recommended keeping nail trimmed short and minimize traction on the nail. Recommended Epsom salts soaks and Neosporin to prevent infection. Tylenol or ibuprofen for pain. Perception for tramadol given to use for severe pain. Follow-up as needed.    Charm Rings, MD 12/06/15 2045

## 2016-01-03 ENCOUNTER — Emergency Department (HOSPITAL_COMMUNITY): Payer: Self-pay

## 2016-01-03 ENCOUNTER — Emergency Department (HOSPITAL_COMMUNITY)
Admission: EM | Admit: 2016-01-03 | Discharge: 2016-01-03 | Disposition: A | Discharge status: Home / Self Care | Payer: Self-pay | Attending: Emergency Medicine | Admitting: Emergency Medicine

## 2016-01-03 ENCOUNTER — Encounter (HOSPITAL_COMMUNITY): Payer: Self-pay

## 2016-01-03 DIAGNOSIS — Z793 Long term (current) use of hormonal contraceptives: Secondary | ICD-10-CM | POA: Insufficient documentation

## 2016-01-03 DIAGNOSIS — Z79899 Other long term (current) drug therapy: Secondary | ICD-10-CM | POA: Insufficient documentation

## 2016-01-03 DIAGNOSIS — X58XXXA Exposure to other specified factors, initial encounter: Secondary | ICD-10-CM | POA: Insufficient documentation

## 2016-01-03 DIAGNOSIS — M79645 Pain in left finger(s): Secondary | ICD-10-CM

## 2016-01-03 DIAGNOSIS — S62617A Displaced fracture of proximal phalanx of left little finger, initial encounter for closed fracture: Secondary | ICD-10-CM | POA: Insufficient documentation

## 2016-01-03 DIAGNOSIS — J45909 Unspecified asthma, uncomplicated: Secondary | ICD-10-CM | POA: Insufficient documentation

## 2016-01-03 DIAGNOSIS — Z8659 Personal history of other mental and behavioral disorders: Secondary | ICD-10-CM | POA: Insufficient documentation

## 2016-01-03 DIAGNOSIS — Y92321 Football field as the place of occurrence of the external cause: Secondary | ICD-10-CM | POA: Insufficient documentation

## 2016-01-03 DIAGNOSIS — Y998 Other external cause status: Secondary | ICD-10-CM | POA: Insufficient documentation

## 2016-01-03 DIAGNOSIS — Y9361 Activity, american tackle football: Secondary | ICD-10-CM | POA: Insufficient documentation

## 2016-01-03 MED ORDER — HYDROCODONE-ACETAMINOPHEN 5-325 MG PO TABS
2.0000 | ORAL_TABLET | Freq: Once | ORAL | Status: AC
  Administered 2016-01-03: 2 via ORAL
  Filled 2016-01-03: qty 2

## 2016-01-03 MED ORDER — IBUPROFEN 800 MG PO TABS: 800.0000 mg | ORAL_TABLET | Freq: Three times a day (TID) | ORAL | Status: DC

## 2016-01-03 MED ORDER — HYDROCODONE-ACETAMINOPHEN 5-325 MG PO TABS: 1.0000 | ORAL_TABLET | Freq: Four times a day (QID) | ORAL | Status: DC | PRN

## 2016-01-03 NOTE — ED Provider Notes (Signed)
CSN: [id number]     Arrival date & time 01/03/16  2122 History   By signing my name below, I, Evon Slack, attest that this documentation has been prepared under the direction and in the presence of Danelle Berry, PA-C. Electronically Signed: Evon Slack, ED Scribe. 01/03/2016. 10:54 PM.      Chief Complaint  Patient presents with  . Hand Injury   Patient is a 25 y.o. female presenting with hand injury. The history is provided by the patient. No language interpreter was used.  Hand Injury  HPI Comments: [name] is a 25 y.o. female who presents to the Emergency Department complaining of left pinky finger injury onset tonight PTA. Pt rates the severity of her pain 7/10. Pt states she is unsure how she injured the finger, but it happened while she was playing flag football. Pt states she has tried ibuprofen with some relief of pan. Pt denies numbness or tingling. Pt does report Hx of nail injury to the same finger several weeks prior. She rates pain  8/10, described as constant and throbbing.  Past Medical History  Diagnosis Date  . Asthma   . Anxiety   . Head injury, closed     dizzy for a few minutes  . Headache 2012    - ? 1 TIME WAS EVALUTED by an MD,  HAD NUMBNESS LEFT SIDE , VISIUAL AND SPEECH DISTRUBANCE   Past Surgical History  Procedure Laterality Date  . Hand surgery Left   . Open reduction internal fixation (orif) proximal phalanx Left 01/06/2016    Procedure: OPEN REDUCTION INTERNAL FIXATION (ORIF) LEFT SMALL FINGER;  Surgeon: Bradly Bienenstock, MD;  Location: [facility] OR;  Service: Orthopedics;  Laterality: Left;   History reviewed. No pertinent family history. Social History  Substance Use Topics  . Smoking status: Never Smoker   . Smokeless tobacco: Never Used  . Alcohol Use: Yes     Comment: on special ocassions, could be a lot but not always   OB History    No data available      Review of Systems  Musculoskeletal: Positive for arthralgias.  Neurological:  Negative for numbness.  All other systems reviewed and are negative.    Allergies  Bee venom and Adhesive  Home Medications   Prior to Admission medications   Medication Sig Start Date End Date Taking? Authorizing Provider  acetaminophen (TYLENOL) 500 MG tablet Take 500 mg by mouth every 6 (six) hours as needed for moderate pain.    Historical Provider, MD  albuterol (PROVENTIL HFA;VENTOLIN HFA) 108 (90 Base) MCG/ACT inhaler Inhale 2 puffs into the lungs every 4 (four) hours as needed for wheezing or shortness of breath. 12/06/15   Charm Rings, MD  HYDROcodone-acetaminophen (NORCO/VICODIN) 5-325 MG tablet Take 1-2 tablets by mouth every 6 (six) hours as needed for severe pain. 01/03/16   Danelle Berry, PA-C  ibuprofen (ADVIL,MOTRIN) 800 MG tablet Take 1 tablet (800 mg total) by mouth 3 (three) times daily. 01/03/16   Danelle Berry, PA-C  norgestimate-ethinyl estradiol (ORTHO-CYCLEN,SPRINTEC,PREVIFEM) 0.25-35 MG-MCG tablet Take 1 tablet by mouth daily.    Historical Provider, MD  oxyCODONE-acetaminophen (ROXICET) 5-325 MG tablet Take 1 tablet by mouth every 4 (four) hours as needed for severe pain. 01/06/16   Bradly Bienenstock, MD   BP 101/67 mmHg  Pulse 67  Temp(Src) 98 F (36.7 C) (Oral)  Resp 20  Ht  (1.6 m)  Wt 74.526 kg  BMI 29.11 kg/m2  SpO2 95%  LMP 12/24/2015  Physical Exam  Constitutional: She is oriented to person, place, and time. She appears well-developed and well-nourished. No distress.  HENT:  Head: Normocephalic and atraumatic.  Right Ear: External ear normal.  Left Ear: External ear normal.  Nose: Nose normal.  Mouth/Throat: Oropharynx is clear and moist. No oropharyngeal exudate.  Eyes: Conjunctivae and EOM are normal. Pupils are equal, round, and reactive to light. Right eye exhibits no discharge. Left eye exhibits no discharge. No scleral icterus.  Neck: Normal range of motion. Neck supple. No JVD present. No tracheal deviation present.  Cardiovascular: Normal  rate and regular rhythm.   Pulmonary/Chest: Effort normal and breath sounds normal. No stridor. No respiratory distress.  Musculoskeletal: She exhibits edema and tenderness.       Left hand: She exhibits decreased range of motion, tenderness, bony tenderness and swelling. She exhibits normal two-point discrimination, normal capillary refill and no laceration.       Hands: Left 5th finger, nailbed pink, normal sensation to light touch, good perfusion, moderate diffuse swelling to 5th finger, with ttp and limited ROM.  Lymphadenopathy:    She has no cervical adenopathy.  Neurological: She is alert and oriented to person, place, and time. She exhibits normal muscle tone. Coordination normal.  Skin: Skin is warm and dry. No rash noted. She is not diaphoretic. No erythema. No pallor.  Psychiatric: She has a normal mood and affect. Her behavior is normal. Judgment and thought content normal.  Nursing note and vitals reviewed.   ED Course  Procedures (including critical care time) DIAGNOSTIC STUDIES: Oxygen Saturation is 95% on RA, adequate by my interpretation.    COORDINATION OF CARE: 10:52 PM-Discussed treatment plan with pt at bedside and pt agreed to plan.     Labs Review Labs Reviewed - No data to display  Imaging Review Dg Finger Little Left  01/03/2016  CLINICAL DATA:  Injury to the left fifth finger while playing flag football, with pain and limited range of motion. Initial encounter. EXAM: LEFT LITTLE FINGER 2+V COMPARISON:  Left hand radiographs performed 08/10/2015 FINDINGS: There is a comminuted fracture involving the distal aspect of the fifth proximal phalanx, with ulnar and dorsal displacement of the largest fragment, and associated ulnar and dorsal displacement of the middle phalanx. Surrounding soft tissue swelling is noted. The fracture extends to the joint space. No additional fractures are seen. IMPRESSION: Comminuted fracture of the distal aspect of the fifth proximal  phalanx, with ulnar and dorsal displacement of the largest fragment, and associated ulnar and dorsal displacement of the middle phalanx. Intra-articular extension noted. Electronically Signed   By: Roanna Raider M.D.   On: 01/03/2016 22:31   I have personally reviewed and evaluated these images as part of my medical decision-making.   EKG Interpretation None      MDM   Pt with left pinky finger injury, Xray significant for comminuted fracture of distal aspect of proximal phalanx.  Pt nailbed pink, normal sensation, good perfusion, moderate swelling, with ttp and limited ROM.  Hand Surgery was consulted, I spoke with Dr. Melvyn Novas who reviewed xrays and instructed pt should be placed in finger splint and f/up in his office on Tuesday.  Pt was placed in static finger splint, given pain medication was advised about follow up with Dr. Melvyn Novas.    Final diagnoses:  Closed fracture of proximal phalanx of fifth finger of left hand, initial encounter    I personally performed the services described in this documentation, which was scribed in my presence.  The recorded information has been reviewed and is accurate.       Danelle BerryLeisa Anetta Olvera, PA-C 01/07/16 1946  Rolland PorterMark James, MD 01/14/16 2300

## 2016-01-03 NOTE — Progress Notes (Signed)
Orthopedic Tech Progress Note Patient Details:  Victoria CulverRachael Nguyen 11/29/1990 [id number]  Ortho Devices Type of Ortho Device: Finger splint Ortho Device/Splint Location: static finger splint lue Ortho Device/Splint Interventions: Ordered, Application   Trinna PostMartinez, Asir Bingley J 01/03/2016, 11:20 PM

## 2016-01-03 NOTE — ED Notes (Signed)
Pt playing flag football, looked down and left little finger was bent out to side, pt wrapped and splinted finger.

## 2016-01-03 NOTE — Discharge Instructions (Signed)
Finger Fracture  Fractures of fingers are breaks in the bones of the fingers. There are many types of fractures. There are different ways of treating these fractures. Your health care provider will discuss the best way to treat your fracture.  CAUSES  Traumatic injury is the main cause of broken fingers. These include:  · Injuries while playing sports.  · Workplace injuries.  · Falls.  RISK FACTORS  Activities that can increase your risk of finger fractures include:  · Sports.  · Workplace activities that involve machinery.  · A condition called osteoporosis, which can make your bones less dense and cause them to fracture more easily.  SIGNS AND SYMPTOMS  The main symptoms of a broken finger are pain and swelling within 15 minutes after the injury. Other symptoms include:  · Bruising of your finger.  · Stiffness of your finger.  · Numbness of your finger.  · Exposed bones (compound fracture) if the fracture is severe.  DIAGNOSIS   The best way to diagnose a broken bone is with X-ray imaging. Additionally, your health care provider will use this X-ray image to evaluate the position of the broken finger bones.   TREATMENT   Finger fractures can be treated with:   · Nonreduction--This means the bones are in place. The finger is splinted without changing the positions of the bone pieces. The splint is usually left on for about a week to 10 days. This will depend on your fracture and what your health care provider thinks.  · Closed reduction--The bones are put back into position without using surgery. The finger is then splinted.  · Open reduction and internal fixation--The fracture site is opened. Then the bone pieces are fixed into place with pins or some type of hardware. This is seldom required. It depends on the severity of the fracture.  HOME CARE INSTRUCTIONS   · Follow your health care provider's instructions regarding activities, exercises, and physical therapy.  · Only take over-the-counter or prescription  medicines for pain, discomfort, or fever as directed by your health care provider.  SEEK MEDICAL CARE IF:  You have pain or swelling that limits the motion or use of your fingers.  SEEK IMMEDIATE MEDICAL CARE IF:   Your finger becomes numb.  MAKE SURE YOU:   · Understand these instructions.  · Will watch your condition.  · Will get help right away if you are not doing well or get worse.     This information is not intended to replace advice given to you by your health care provider. Make sure you discuss any questions you have with your health care provider.     Document Released: 01/08/2001 Document Revised: 07/17/2013 Document Reviewed: 05/08/2013  Elsevier Interactive Patient Education ©2016 [employer]

## 2016-01-03 NOTE — ED Notes (Signed)
Paged ortho tech 

## 2016-01-03 NOTE — ED Notes (Signed)
See EDP assessment 

## 2016-01-05 ENCOUNTER — Encounter (HOSPITAL_COMMUNITY): Payer: Self-pay | Admitting: *Deleted

## 2016-01-05 NOTE — Progress Notes (Signed)
Ms [name] reports being seen in [facility] 11/2015 with complaints of chest pain.  Patient noted the chest pain while she was working out at [employer].  Patient was ruled out for Pulmonary Embolus  or MI. Patient was diagnosed and treated with URI.  Ms [name] reports an episode in 08/2011, she was at a play in [place].  Patient had visual changes in the left eye, left side became numb, unable to stand, speech was slurred and she had a headache.  Patient was in a group of students from [employer] and a Dr was accompanying them.  The Dr evaluated patient and felt that she had a migraine caused by the bright lights from the stage and patient thinks she was given Ibuprofen maybe 800mg .   Symptoms began to go away and patient went to room and went to bed and has not had these  symptoms since.  Patient has informed MDs of episode and it was thought that it may have been more than a migraine headache but not testing was ordered.  Patient does not have a PCP curremtly

## 2016-01-05 NOTE — H&P (Signed)
[name] is an 25 y.o. female.   Chief Complaint: left small finger displaced proximal phalanx fracture HPI: Pt sustained left small finger displaced proximal phalanx fracture Pt here for surgery No prior surgery to left small finger Has had two prior surgeries to left hand  Past Medical History  Diagnosis Date  . Asthma     Past Surgical History  Procedure Laterality Date  . Hand surgery      No family history on file. Social History:  reports that she has never smoked. She does not have any smokeless tobacco history on file. She reports that she does not drink alcohol or use illicit drugs.  Allergies: No Known Allergies  No prescriptions prior to admission    No results found for this or any previous visit (from the past 48 hour(s)). Dg Finger Little Left  01/03/2016  CLINICAL DATA:  Injury to the left fifth finger while playing flag football, with pain and limited range of motion. Initial encounter. EXAM: LEFT LITTLE FINGER 2+V COMPARISON:  Left hand radiographs performed 08/10/2015 FINDINGS: There is a comminuted fracture involving the distal aspect of the fifth proximal phalanx, with ulnar and dorsal displacement of the largest fragment, and associated ulnar and dorsal displacement of the middle phalanx. Surrounding soft tissue swelling is noted. The fracture extends to the joint space. No additional fractures are seen. IMPRESSION: Comminuted fracture of the distal aspect of the fifth proximal phalanx, with ulnar and dorsal displacement of the largest fragment, and associated ulnar and dorsal displacement of the middle phalanx. Intra-articular extension noted. Electronically Signed   By: Roanna RaiderJeffery  Chang M.D.   On: 01/03/2016 22:31    ROSNO RECENT ILLNESSES OR HOSPITALIZATIONS  Last menstrual period 12/24/2015. Physical Exam  General Appearance:  Alert, cooperative, no distress, appears stated age  Head:  Normocephalic, without obvious abnormality, atraumatic  Eyes:  Pupils  equal, conjunctiva/corneas clear,         Throat: Lips, mucosa, and tongue normal; teeth and gums normal  Neck: No visible masses     Lungs:   respirations unlabored  Chest Wall:  No tenderness or deformity  Heart:  Regular rate and rhythm,  Abdomen:   Soft, non-tender,         Extremities: LEFT HAND: SMALL FINGER IN SPLINT FINGER WARM WELL PERFUSED GOOD MOBILITY TO LONG/RING/INDEX GOOD WRIST AND FOREARM MOBILITY  Pulses: 2+ and symmetric  Skin: Skin color, texture, turgor normal, no rashes or lesions     Neurologic: Normal    Assessment/Plan LEFT SMALL FINGER DISPLACED CONDYLAR FRACTURE INVOLVING IP JOINT  LEFT SMALL FINGER OPEN REDUCTION AND INTERNAL FIXATION AND REPAIR AS INDICATED  R/B/A DISCUSSED WITH PT IN OFFICE.  PT VOICED UNDERSTANDING OF PLAN CONSENT SIGNED DAY OF SURGERY PT SEEN AND EXAMINED PRIOR TO OPERATIVE PROCEDURE/DAY OF SURGERY SITE MARKED. QUESTIONS ANSWERED WILL GO HOME FOLLOWING SURGERY  WE ARE PLANNING SURGERY FOR YOUR UPPER EXTREMITY. THE RISKS AND BENEFITS OF SURGERY INCLUDE BUT NOT LIMITED TO BLEEDING INFECTION, DAMAGE TO NEARBY NERVES ARTERIES TENDONS, FAILURE OF SURGERY TO ACCOMPLISH ITS INTENDED GOALS, PERSISTENT SYMPTOMS AND NEED FOR FURTHER SURGICAL INTERVENTION. WITH THIS IN MIND WE WILL PROCEED. I HAVE DISCUSSED WITH THE PATIENT THE PRE AND POSTOPERATIVE REGIMEN AND THE DOS AND DON'TS. PT VOICED UNDERSTANDING AND INFORMED CONSENT SIGNED.  [name],Samayra Hebel W 01/06/2016 @ 1551

## 2016-01-05 NOTE — Brief Op Note (Signed)
01/06/2016  3:52 PM  PATIENT:  Montel Culverachael Villaflor  25 y.o. female  PRE-OPERATIVE DIAGNOSIS:  LEFT SMALL FINGER PROXIMAL PHALANX FRACTURE, DISPLACED INTRA-ARTICULAR  POST-OPERATIVE DIAGNOSIS:  * No post-op diagnosis entered *  PROCEDURE:  Procedure(s): OPEN REDUCTION INTERNAL FIXATION (ORIF) LEFT SMALL FINGER (Left)  SURGEON:  Surgeon(s) and Role:    * Bradly BienenstockFred Sinai Illingworth, MD - Primary  PHYSICIAN ASSISTANT:   ASSISTANTS: none   ANESTHESIA:   general  EBL:     BLOOD ADMINISTERED:none  DRAINS: none   LOCAL MEDICATIONS USED:  MARCAINE     SPECIMEN:  No Specimen  DISPOSITION OF SPECIMEN:  N/A  COUNTS:  YES  TOURNIQUET:  * No tourniquets in log *  DICTATION: .Other Dictation: Dictation Number [id number]  PLAN OF CARE: Discharge to home after PACU  PATIENT DISPOSITION:  PACU - hemodynamically stable.   Delay start of Pharmacological VTE agent (>24hrs) due to surgical blood loss or risk of bleeding: not applicable

## 2016-01-06 ENCOUNTER — Encounter (HOSPITAL_COMMUNITY)
Admission: RE | Disposition: A | Discharge status: Home / Self Care | Payer: Self-pay | Source: Ambulatory Visit | Attending: Orthopedic Surgery

## 2016-01-06 ENCOUNTER — Encounter (HOSPITAL_COMMUNITY): Payer: Self-pay | Admitting: *Deleted

## 2016-01-06 ENCOUNTER — Ambulatory Visit (HOSPITAL_COMMUNITY): Payer: PRIVATE HEALTH INSURANCE | Admitting: Anesthesiology

## 2016-01-06 ENCOUNTER — Ambulatory Visit (HOSPITAL_COMMUNITY)
Admission: RE | Admit: 2016-01-06 | Discharge: 2016-01-06 | Disposition: A | Discharge status: Home / Self Care | Payer: PRIVATE HEALTH INSURANCE | Source: Ambulatory Visit | Attending: Orthopedic Surgery | Admitting: Orthopedic Surgery

## 2016-01-06 DIAGNOSIS — S62617A Displaced fracture of proximal phalanx of left little finger, initial encounter for closed fracture: Secondary | ICD-10-CM | POA: Diagnosis present

## 2016-01-06 HISTORY — DX: Unspecified injury of head, initial encounter: S09.90XA

## 2016-01-06 HISTORY — DX: Anxiety disorder, unspecified: F41.9

## 2016-01-06 HISTORY — PX: OPEN REDUCTION INTERNAL FIXATION (ORIF) PROXIMAL PHALANX: SHX6235

## 2016-01-06 LAB — CBC
HCT: 39.7 % (ref 36.0–46.0)
HEMOGLOBIN: 12.7 g/dL (ref 12.0–15.0)
MCH: 28.5 pg (ref 26.0–34.0)
MCHC: 32 g/dL (ref 30.0–36.0)
MCV: 89 fL (ref 78.0–100.0)
PLATELETS: 383 10*3/uL (ref 150–400)
RBC: 4.46 MIL/uL (ref 3.87–5.11)
RDW: 12.9 % (ref 11.5–15.5)
WBC: 9.4 10*3/uL (ref 4.0–10.5)

## 2016-01-06 LAB — HCG, SERUM, QUALITATIVE: Preg, Serum: NEGATIVE

## 2016-01-06 SURGERY — OPEN REDUCTION INTERNAL FIXATION (ORIF) PROXIMAL PHALANX
Anesthesia: General | Site: Finger | Laterality: Left

## 2016-01-06 MED ORDER — OXYCODONE-ACETAMINOPHEN 5-325 MG PO TABS
ORAL_TABLET | ORAL | Status: AC
  Administered 2016-01-06: 1
  Filled 2016-01-06: qty 1

## 2016-01-06 MED ORDER — CEFAZOLIN SODIUM-DEXTROSE 2-4 GM/100ML-% IV SOLN
2.0000 g | INTRAVENOUS | Status: AC
  Administered 2016-01-06: 2 g via INTRAVENOUS
  Filled 2016-01-06: qty 100

## 2016-01-06 MED ORDER — LACTATED RINGERS IV SOLN
INTRAVENOUS | Status: DC
  Administered 2016-01-06: 15:00:00 via INTRAVENOUS

## 2016-01-06 MED ORDER — 0.9 % SODIUM CHLORIDE (POUR BTL) OPTIME
TOPICAL | Status: DC | PRN
  Administered 2016-01-06: 1000 mL

## 2016-01-06 MED ORDER — OXYCODONE-ACETAMINOPHEN 5-325 MG PO TABS
1.0000 | ORAL_TABLET | ORAL | Status: DC | PRN
Start: 2016-01-06 — End: 2016-04-04

## 2016-01-06 MED ORDER — HYDROMORPHONE HCL 1 MG/ML IJ SOLN
0.2500 mg | INTRAMUSCULAR | Status: DC | PRN
  Administered 2016-01-06 (×2): 0.5 mg via INTRAVENOUS

## 2016-01-06 MED ORDER — BUPIVACAINE HCL (PF) 0.25 % IJ SOLN
INTRAMUSCULAR | Status: DC | PRN
  Administered 2016-01-06: 7 mL

## 2016-01-06 MED ORDER — LACTATED RINGERS IV SOLN
INTRAVENOUS | Status: DC | PRN
  Administered 2016-01-06: 16:00:00 via INTRAVENOUS

## 2016-01-06 MED ORDER — MIDAZOLAM HCL 5 MG/5ML IJ SOLN
INTRAMUSCULAR | Status: DC | PRN
  Administered 2016-01-06: 2 mg via INTRAVENOUS

## 2016-01-06 MED ORDER — HYDROMORPHONE HCL 1 MG/ML IJ SOLN
INTRAMUSCULAR | Status: AC
  Administered 2016-01-06: 0.5 mg via INTRAVENOUS
  Filled 2016-01-06: qty 1

## 2016-01-06 MED ORDER — ONDANSETRON HCL 4 MG/2ML IJ SOLN
INTRAMUSCULAR | Status: DC | PRN
  Administered 2016-01-06: 4 mg via INTRAVENOUS

## 2016-01-06 MED ORDER — FENTANYL CITRATE (PF) 100 MCG/2ML IJ SOLN
INTRAMUSCULAR | Status: DC | PRN
  Administered 2016-01-06 (×5): 50 ug via INTRAVENOUS

## 2016-01-06 MED ORDER — BUPIVACAINE HCL (PF) 0.25 % IJ SOLN
INTRAMUSCULAR | Status: AC
  Filled 2016-01-06: qty 30

## 2016-01-06 MED ORDER — ONDANSETRON HCL 4 MG/2ML IJ SOLN
INTRAMUSCULAR | Status: AC
  Filled 2016-01-06: qty 2

## 2016-01-06 MED ORDER — CHLORHEXIDINE GLUCONATE 4 % EX LIQD: 60.0000 mL | Freq: Once | CUTANEOUS | Status: DC

## 2016-01-06 MED ORDER — MIDAZOLAM HCL 2 MG/2ML IJ SOLN
INTRAMUSCULAR | Status: AC
  Filled 2016-01-06: qty 2

## 2016-01-06 MED ORDER — FENTANYL CITRATE (PF) 250 MCG/5ML IJ SOLN
INTRAMUSCULAR | Status: AC
  Filled 2016-01-06: qty 5

## 2016-01-06 SURGICAL SUPPLY — 58 items
0.028" X 102MM KWIRE ×6 IMPLANT
BANDAGE ELASTIC 3 VELCRO ST LF (GAUZE/BANDAGES/DRESSINGS) IMPLANT
BANDAGE ELASTIC 4 VELCRO ST LF (GAUZE/BANDAGES/DRESSINGS) IMPLANT
BNDG COHESIVE 1X5 TAN STRL LF (GAUZE/BANDAGES/DRESSINGS) IMPLANT
BNDG CONFORM 2 STRL LF (GAUZE/BANDAGES/DRESSINGS) ×3 IMPLANT
BNDG ELASTIC 2 VLCR STRL LF (GAUZE/BANDAGES/DRESSINGS) ×3 IMPLANT
BNDG ELASTIC 2X5.8 VLCR STR LF (GAUZE/BANDAGES/DRESSINGS) ×3 IMPLANT
BNDG ESMARK 4X9 LF (GAUZE/BANDAGES/DRESSINGS) ×3 IMPLANT
BNDG GAUZE ELAST 4 BULKY (GAUZE/BANDAGES/DRESSINGS) ×3 IMPLANT
CAP PIN ORTHO PINK (CAP) IMPLANT
CAP PIN PROTECTOR ORTHO WHT (CAP) IMPLANT
CORDS BIPOLAR (ELECTRODE) ×3 IMPLANT
COVER SURGICAL LIGHT HANDLE (MISCELLANEOUS) ×3 IMPLANT
CUFF TOURNIQUET SINGLE 18IN (TOURNIQUET CUFF) ×3 IMPLANT
CUFF TOURNIQUET SINGLE 24IN (TOURNIQUET CUFF) IMPLANT
DRAPE OEC MINIVIEW 54X84 (DRAPES) IMPLANT
DRAPE SURG 17X23 STRL (DRAPES) ×3 IMPLANT
DRSG ADAPTIC 3X8 NADH LF (GAUZE/BANDAGES/DRESSINGS) IMPLANT
DRSG EMULSION OIL 3X3 NADH (GAUZE/BANDAGES/DRESSINGS) ×3 IMPLANT
GAUZE SPONGE 2X2 8PLY STRL LF (GAUZE/BANDAGES/DRESSINGS) IMPLANT
GAUZE SPONGE 4X4 12PLY STRL (GAUZE/BANDAGES/DRESSINGS) IMPLANT
GLOVE BIOGEL PI IND STRL 8.5 (GLOVE) ×1 IMPLANT
GLOVE BIOGEL PI INDICATOR 8.5 (GLOVE) ×2
GLOVE SURG ORTHO 8.0 STRL STRW (GLOVE) ×3 IMPLANT
GOWN STRL REUS W/ TWL LRG LVL3 (GOWN DISPOSABLE) ×2 IMPLANT
GOWN STRL REUS W/ TWL XL LVL3 (GOWN DISPOSABLE) ×1 IMPLANT
GOWN STRL REUS W/TWL LRG LVL3 (GOWN DISPOSABLE) ×4
GOWN STRL REUS W/TWL XL LVL3 (GOWN DISPOSABLE) ×2
K-WIRE SMTH SNGL TROCAR .028X4 (WIRE) ×3
KIT BASIN OR (CUSTOM PROCEDURE TRAY) ×3 IMPLANT
KIT ROOM TURNOVER OR (KITS) ×3 IMPLANT
KWIRE SMTH SNGL TROCAR .028X4 (WIRE) ×1 IMPLANT
MANIFOLD NEPTUNE II (INSTRUMENTS) ×3 IMPLANT
NEEDLE HYPO 25GX1X1/2 BEV (NEEDLE) IMPLANT
NS IRRIG 1000ML POUR BTL (IV SOLUTION) ×3 IMPLANT
PACK ORTHO EXTREMITY (CUSTOM PROCEDURE TRAY) ×3 IMPLANT
PAD ARMBOARD 7.5X6 YLW CONV (MISCELLANEOUS) ×6 IMPLANT
PAD CAST 4YDX4 CTTN HI CHSV (CAST SUPPLIES) IMPLANT
PADDING CAST COTTON 4X4 STRL (CAST SUPPLIES)
PADDING UNDERCAST 2  STERILE (CAST SUPPLIES) ×3 IMPLANT
SOAP 2 % CHG 4 OZ (WOUND CARE) ×3 IMPLANT
SPONGE GAUZE 2X2 STER 10/PKG (GAUZE/BANDAGES/DRESSINGS)
SPONGE GAUZE 4X4 12PLY STER LF (GAUZE/BANDAGES/DRESSINGS) ×3 IMPLANT
SUCTION FRAZIER HANDLE 10FR (MISCELLANEOUS) ×2
SUCTION TUBE FRAZIER 10FR DISP (MISCELLANEOUS) ×1 IMPLANT
SUT ETHIBOND 4 0 TF (SUTURE) ×3 IMPLANT
SUT MERSILENE 4 0 P 3 (SUTURE) IMPLANT
SUT PROLENE 4 0 PS 2 18 (SUTURE) IMPLANT
SUT VIC AB 4-0 P-3 18X BRD (SUTURE) ×1 IMPLANT
SUT VIC AB 4-0 P3 18 (SUTURE) ×2
SYR CONTROL 10ML LL (SYRINGE) ×3 IMPLANT
TOWEL OR 17X24 6PK STRL BLUE (TOWEL DISPOSABLE) ×3 IMPLANT
TOWEL OR 17X26 10 PK STRL BLUE (TOWEL DISPOSABLE) ×3 IMPLANT
TUBE CONNECTING 12'X1/4 (SUCTIONS) ×1
TUBE CONNECTING 12X1/4 (SUCTIONS) ×2 IMPLANT
UNDERPAD 30X30 INCONTINENT (UNDERPADS AND DIAPERS) ×3 IMPLANT
WATER STERILE IRR 1000ML POUR (IV SOLUTION) IMPLANT
WIRE K .035MM 164206035 (MISCELLANEOUS) ×3 IMPLANT

## 2016-01-06 NOTE — Anesthesia Preprocedure Evaluation (Addendum)
Anesthesia Evaluation  Patient identified by MRN, date of birth, ID band Patient awake    Reviewed: Allergy & Precautions, NPO status   Airway Mallampati: I  TM Distance: >3 FB Neck ROM: Full    Dental   Pulmonary asthma ,    breath sounds clear to auscultation       Cardiovascular negative cardio ROS   Rhythm:Regular Rate:Normal     Neuro/Psych    GI/Hepatic negative GI ROS, Neg liver ROS,   Endo/Other  negative endocrine ROS  Renal/GU negative Renal ROS     Musculoskeletal   Abdominal   Peds  Hematology   Anesthesia Other Findings   Reproductive/Obstetrics                            Anesthesia Physical Anesthesia Plan  ASA: III  Anesthesia Plan: General   Post-op Pain Management:    Induction: Intravenous  Airway Management Planned: LMA  Additional Equipment:   Intra-op Plan:   Post-operative Plan: Extubation in OR  Informed Consent: I have reviewed the patients History and Physical, chart, labs and discussed the procedure including the risks, benefits and alternatives for the proposed anesthesia with the patient or authorized representative who has indicated his/her understanding and acceptance.   Dental advisory given  Plan Discussed with: CRNA and Anesthesiologist  Anesthesia Plan Comments:         Anesthesia Quick Evaluation

## 2016-01-06 NOTE — Anesthesia Postprocedure Evaluation (Signed)
Anesthesia Post Note  Patient: Victoria CulverRachael Nguyen  Procedure(s) Performed: Procedure(s) (LRB): OPEN REDUCTION INTERNAL FIXATION (ORIF) LEFT SMALL FINGER (Left)  Patient location during evaluation: PACU Anesthesia Type: General Level of consciousness: awake and alert Pain management: pain level controlled Vital Signs Assessment: post-procedure vital signs reviewed and stable Respiratory status: spontaneous breathing, nonlabored ventilation, respiratory function stable and patient connected to nasal cannula oxygen Cardiovascular status: blood pressure returned to baseline and stable Postop Assessment: no signs of nausea or vomiting Anesthetic complications: no    Last Vitals:  Filed Vitals:   01/06/16 1945 01/06/16 2000  BP: 104/69 107/66  Pulse: 61 55  Temp:  36.5 C  Resp: 18 13    Last Pain:  Filed Vitals:   01/06/16 2007  PainSc: 5                  Reino KentJudd, [name]

## 2016-01-06 NOTE — Discharge Instructions (Signed)
KEEP BANDAGE CLEAN AND DRY CALL OFFICE FOR F/U APPT 545-5000 in 12 days KEEP HAND ELEVATED ABOVE HEART OK TO APPLY ICE TO OPERATIVE AREA CONTACT OFFICE IF ANY WORSENING PAIN OR CONCERNS. 

## 2016-01-06 NOTE — Anesthesia Procedure Notes (Signed)
Procedure Name: LMA Insertion Date/Time: 01/06/2016 4:20 PM Performed by: Gavin PoundLOWDER, Ryota Treece J Pre-anesthesia Checklist: Patient identified, Timeout performed, Emergency Drugs available, Suction available and Patient being monitored Patient Re-evaluated:Patient Re-evaluated prior to inductionOxygen Delivery Method: Circle system utilized Preoxygenation: Pre-oxygenation with 100% oxygen Intubation Type: IV induction Ventilation: Mask ventilation without difficulty LMA: LMA inserted LMA Size: 4.0 Number of attempts: 1 Placement Confirmation: positive ETCO2 and breath sounds checked- equal and bilateral Tube secured with: Tape Dental Injury: Teeth and Oropharynx as per pre-operative assessment

## 2016-01-06 NOTE — Transfer of Care (Signed)
Immediate Anesthesia Transfer of Care Note  Patient: Victoria CulverRachael Nguyen  Procedure(s) Performed: Procedure(s): OPEN REDUCTION INTERNAL FIXATION (ORIF) LEFT SMALL FINGER (Left)  Patient Location: PACU  Anesthesia Type:General  Level of Consciousness: awake  Airway & Oxygen Therapy: Patient Spontanous Breathing and Patient connected to nasal cannula oxygen  Post-op Assessment: Report given to RN and Post -op Vital signs reviewed and stable  Post vital signs: Reviewed and stable  Last Vitals:  Filed Vitals:   01/06/16 1414 01/06/16 1808  BP: 130/75 114/73  Pulse: 68 82  Temp: 37.1 C 36.6 C  Resp: 20 18    Complications: No apparent anesthesia complications

## 2016-01-07 ENCOUNTER — Encounter (HOSPITAL_COMMUNITY): Payer: Self-pay | Admitting: Orthopedic Surgery

## 2016-01-07 NOTE — Anesthesia Postprocedure Evaluation (Signed)
Anesthesia Post Note  Patient: [name]  Procedure(s) Performed: Procedure(s) (LRB): OPEN REDUCTION INTERNAL FIXATION (ORIF) LEFT SMALL FINGER (Left)  Vital Signs Assessment: post-procedure vital signs reviewed and stable    Last Vitals:  Filed Vitals:   01/06/16 1945 01/06/16 2000  BP: 104/69 107/66  Pulse: 61 55  Temp:  36.5 C  Resp: 18 13    Last Pain:  Filed Vitals:   01/06/16 2029  PainSc: 5                  EDWARDS,Manasvini Whatley

## 2016-01-07 NOTE — Op Note (Signed)
[name], [name]                ACCOUNT NO.:  [id number]  MEDICAL RECORD NO.:  [id number]  LOCATION:  [facility]                         FACILITY:  [facility]  PHYSICIAN:  Sharma Covert IV, M.D.DATE OF BIRTH:  [birth year]  DATE OF PROCEDURE:  01/06/2016 DATE OF DISCHARGE:  01/06/2016                              OPERATIVE REPORT   PREOPERATIVE DIAGNOSIS:  Left small finger proximal phalanx fracture involving articular surface of the PIP joint.  POSTOPERATIVE DIAGNOSIS:  Left small finger proximal phalanx fracture involving articular surface of the PIP joint.  ATTENDING SURGEON:  Sharma Covert, M.D., who scrubbed and present for the entire procedure.  ASSISTANT SURGEON:  None.  ANESTHESIA:  General via LMA.  PROCEDURE: 1. Open treatment of left small finger proximal phalanx fracture     involving articular surface of the interphalangeal joint requiring     internal fixation. 2. Radiographs 2 views, left small finger.  RADIOGRAPHIC INTERPRETATION:  Three views of the finger; AP, lateral, oblique views do show the K-wire fixation placed in good position.  SURGICAL IMPLANTS:  Three 0.028 K-wires.  SURGICAL INDICATIONS:  [name] is a right-hand-dominant female, who was playing football sustained a comminuted intra-articular small finger proximal phalanx fracture.  The patient was seen and evaluated and recommended to undergo the above procedure.  Risks, benefits, and alternatives were discussed in detail with the patient.  Signed informed consent was obtained.  Risks include, but not limited to bleeding; infection; damage to nearby nerves, arteries, or tendons; loss of motion of wrist and digits; incomplete relief of symptoms; and need for further surgical intervention.  DESCRIPTION OF PROCEDURE:  The patient was properly identified in the preoperative holding area and marked with a permanent marker made on left small finger to indicate the correct operative site.  The  patient was brought back to the operating room, placed supine on anesthesia table, where general anesthesia was administered.  The patient tolerated this well.  A well-padded tourniquet placed on left brachium and sealed with 1000 drape.  Left upper extremity was then prepped and draped in normal sterile fashion.  Time-out was called, correct side was identified, and procedure then begun.  Attention then turned to the small finger.  Longitudinal incision made directly over the small finger, extensor mechanism.  Dissection was carried down through the skin and subcutaneous tissue, down the extensor mechanism.  The extensor mechanism was split longitudinally exposing the fracture site.  This was an intra-articular fracture with several fragments fairly highly comminuted fracture.  Following this, there may be amenable to plating or screw fixation, but given the comminution and very distal, almost transcondylar with a condylar split that was not amenable to screw fixation or plate fixation.  The wound was irrigated.  Careful meticulous reduction was then performed and held in place with 2 cross K- wires, 0.028 K-wires.  These were confirmed using the mini C-arm. Additional K-wire was then placed to stabilize the joint, 0.028 K-wire was placed to transfix the joint.  K-wires were then bent and cut and left underneath the skin.  The wound was irrigated.  Final radiographs were obtained.  This extensor mechanism closed with Ethibond suture, and the  skin closed with 4-0 Vicryl Rapide.  Adaptic dressing and sterile compressive bandage were applied.  The patient placed in a well-padded ulnar gutter splint, extubated, and taken to recovery room in good condition.  POSTPROCEDURAL PLAN:  The patient will be discharged home, seen back in the office in 2 weeks.  X-rays, out of the splint, and place her back into an ulnar gutter cast.  X-rays at the 4-week mark, and I will plan for deep implant  removal likely in the office.  Radiographs at each visit.     [name], M.D.     FWO/MEDQ  D:  01/06/2016  T:  01/07/2016  Job:  [id number]

## 2016-02-09 ENCOUNTER — Encounter (HOSPITAL_COMMUNITY): Payer: Self-pay | Admitting: *Deleted

## 2016-02-10 ENCOUNTER — Encounter (HOSPITAL_COMMUNITY): Payer: Self-pay | Admitting: *Deleted

## 2016-02-10 ENCOUNTER — Ambulatory Visit (HOSPITAL_COMMUNITY): Payer: Self-pay | Admitting: Certified Registered Nurse Anesthetist

## 2016-02-10 ENCOUNTER — Ambulatory Visit (HOSPITAL_COMMUNITY)
Admission: RE | Admit: 2016-02-10 | Discharge: 2016-02-10 | Disposition: A | Discharge status: Home / Self Care | Payer: Self-pay | Source: Ambulatory Visit | Attending: Orthopedic Surgery | Admitting: Orthopedic Surgery

## 2016-02-10 ENCOUNTER — Encounter (HOSPITAL_COMMUNITY)
Admission: RE | Disposition: A | Discharge status: Home / Self Care | Payer: Self-pay | Source: Ambulatory Visit | Attending: Orthopedic Surgery

## 2016-02-10 DIAGNOSIS — Z472 Encounter for removal of internal fixation device: Secondary | ICD-10-CM | POA: Insufficient documentation

## 2016-02-10 HISTORY — DX: Other specified postprocedural states: Z98.890

## 2016-02-10 HISTORY — DX: Nausea with vomiting, unspecified: R11.2

## 2016-02-10 HISTORY — DX: Adverse effect of unspecified anesthetic, initial encounter: T41.45XA

## 2016-02-10 HISTORY — PX: HARDWARE REMOVAL: SHX979

## 2016-02-10 HISTORY — DX: Other complications of anesthesia, initial encounter: T88.59XA

## 2016-02-10 LAB — HCG, SERUM, QUALITATIVE: Preg, Serum: NEGATIVE

## 2016-02-10 LAB — HEMOGLOBIN: HEMOGLOBIN: 12.7 g/dL (ref 12.0–15.0)

## 2016-02-10 SURGERY — REMOVAL, HARDWARE
Anesthesia: Monitor Anesthesia Care | Laterality: Left

## 2016-02-10 MED ORDER — CEFAZOLIN SODIUM-DEXTROSE 2-4 GM/100ML-% IV SOLN
INTRAVENOUS | Status: AC
  Filled 2016-02-10: qty 100

## 2016-02-10 MED ORDER — LACTATED RINGERS IV SOLN
INTRAVENOUS | Status: DC
  Administered 2016-02-10 (×2): via INTRAVENOUS

## 2016-02-10 MED ORDER — MIDAZOLAM HCL 2 MG/2ML IJ SOLN
INTRAMUSCULAR | Status: AC
  Filled 2016-02-10: qty 2

## 2016-02-10 MED ORDER — BUPIVACAINE HCL (PF) 0.25 % IJ SOLN
INTRAMUSCULAR | Status: AC
  Filled 2016-02-10: qty 30

## 2016-02-10 MED ORDER — PROPOFOL 500 MG/50ML IV EMUL
INTRAVENOUS | Status: DC | PRN
  Administered 2016-02-10: 120 ug/kg/min via INTRAVENOUS

## 2016-02-10 MED ORDER — FENTANYL CITRATE (PF) 250 MCG/5ML IJ SOLN
INTRAMUSCULAR | Status: AC
  Filled 2016-02-10: qty 5

## 2016-02-10 MED ORDER — PROPOFOL 10 MG/ML IV BOLUS
INTRAVENOUS | Status: AC
  Filled 2016-02-10: qty 20

## 2016-02-10 MED ORDER — ONDANSETRON HCL 4 MG/2ML IJ SOLN
INTRAMUSCULAR | Status: DC | PRN
  Administered 2016-02-10: 4 mg via INTRAVENOUS

## 2016-02-10 MED ORDER — FENTANYL CITRATE (PF) 100 MCG/2ML IJ SOLN
INTRAMUSCULAR | Status: DC | PRN
  Administered 2016-02-10: 100 ug via INTRAVENOUS

## 2016-02-10 MED ORDER — CHLORHEXIDINE GLUCONATE 4 % EX LIQD: 60.0000 mL | Freq: Once | CUTANEOUS | Status: DC

## 2016-02-10 MED ORDER — ONDANSETRON HCL 4 MG/2ML IJ SOLN
INTRAMUSCULAR | Status: AC
  Filled 2016-02-10: qty 2

## 2016-02-10 MED ORDER — BUPIVACAINE HCL (PF) 0.25 % IJ SOLN
INTRAMUSCULAR | Status: DC | PRN
  Administered 2016-02-10: 10 mL

## 2016-02-10 MED ORDER — PROPOFOL 10 MG/ML IV BOLUS
INTRAVENOUS | Status: DC | PRN
  Administered 2016-02-10: 20 mg via INTRAVENOUS
  Administered 2016-02-10: 30 mg via INTRAVENOUS

## 2016-02-10 MED ORDER — FENTANYL CITRATE (PF) 100 MCG/2ML IJ SOLN: 25.0000 ug | INTRAMUSCULAR | Status: DC | PRN

## 2016-02-10 MED ORDER — LIDOCAINE HCL (PF) 1 % IJ SOLN
INTRAMUSCULAR | Status: AC
  Filled 2016-02-10: qty 30

## 2016-02-10 MED ORDER — CEFAZOLIN SODIUM-DEXTROSE 2-4 GM/100ML-% IV SOLN
2.0000 g | INTRAVENOUS | Status: AC
  Administered 2016-02-10: 2 g via INTRAVENOUS

## 2016-02-10 MED ORDER — MIDAZOLAM HCL 5 MG/5ML IJ SOLN
INTRAMUSCULAR | Status: DC | PRN
  Administered 2016-02-10: 2 mg via INTRAVENOUS

## 2016-02-10 SURGICAL SUPPLY — 50 items
BANDAGE ELASTIC 3 VELCRO ST LF (GAUZE/BANDAGES/DRESSINGS) IMPLANT
BANDAGE ELASTIC 4 VELCRO ST LF (GAUZE/BANDAGES/DRESSINGS) IMPLANT
BLADE SURG ROTATE 9660 (MISCELLANEOUS) IMPLANT
BNDG ESMARK 4X9 LF (GAUZE/BANDAGES/DRESSINGS) ×3 IMPLANT
BNDG GAUZE ELAST 4 BULKY (GAUZE/BANDAGES/DRESSINGS) IMPLANT
CLOSURE WOUND 1/2 X4 (GAUZE/BANDAGES/DRESSINGS)
CORDS BIPOLAR (ELECTRODE) ×3 IMPLANT
COVER SURGICAL LIGHT HANDLE (MISCELLANEOUS) ×3 IMPLANT
CUFF TOURNIQUET SINGLE 18IN (TOURNIQUET CUFF) ×3 IMPLANT
CUFF TOURNIQUET SINGLE 24IN (TOURNIQUET CUFF) IMPLANT
DRAIN TLS ROUND 10FR (DRAIN) IMPLANT
DRAPE OEC MINIVIEW 54X84 (DRAPES) IMPLANT
DRAPE SURG 17X11 SM STRL (DRAPES) ×3 IMPLANT
DRSG ADAPTIC 3X8 NADH LF (GAUZE/BANDAGES/DRESSINGS) ×3 IMPLANT
ELECT REM PT RETURN 9FT ADLT (ELECTROSURGICAL)
ELECTRODE REM PT RTRN 9FT ADLT (ELECTROSURGICAL) IMPLANT
GAUZE SPONGE 4X4 12PLY STRL (GAUZE/BANDAGES/DRESSINGS) ×3 IMPLANT
GAUZE SPONGE 4X4 16PLY XRAY LF (GAUZE/BANDAGES/DRESSINGS) IMPLANT
GLOVE BIOGEL PI IND STRL 8.5 (GLOVE) ×1 IMPLANT
GLOVE BIOGEL PI INDICATOR 8.5 (GLOVE) ×2
GLOVE SURG ORTHO 8.0 STRL STRW (GLOVE) ×3 IMPLANT
GOWN STRL REUS W/ TWL LRG LVL3 (GOWN DISPOSABLE) ×3 IMPLANT
GOWN STRL REUS W/ TWL XL LVL3 (GOWN DISPOSABLE) ×1 IMPLANT
GOWN STRL REUS W/TWL LRG LVL3 (GOWN DISPOSABLE) ×6
GOWN STRL REUS W/TWL XL LVL3 (GOWN DISPOSABLE) ×2
KIT BASIN OR (CUSTOM PROCEDURE TRAY) ×3 IMPLANT
KIT ROOM TURNOVER OR (KITS) ×3 IMPLANT
MANIFOLD NEPTUNE II (INSTRUMENTS) ×3 IMPLANT
NEEDLE 22X1 1/2 (OR ONLY) (NEEDLE) IMPLANT
NS IRRIG 1000ML POUR BTL (IV SOLUTION) ×3 IMPLANT
PACK ORTHO EXTREMITY (CUSTOM PROCEDURE TRAY) ×3 IMPLANT
PAD ARMBOARD 7.5X6 YLW CONV (MISCELLANEOUS) ×6 IMPLANT
PAD CAST 4YDX4 CTTN HI CHSV (CAST SUPPLIES) ×1 IMPLANT
PADDING CAST COTTON 4X4 STRL (CAST SUPPLIES) ×2
SOAP 2 % CHG 4 OZ (WOUND CARE) ×3 IMPLANT
SPONGE LAP 4X18 X RAY DECT (DISPOSABLE) IMPLANT
STRIP CLOSURE SKIN 1/2X4 (GAUZE/BANDAGES/DRESSINGS) IMPLANT
SUCTION FRAZIER HANDLE 10FR (MISCELLANEOUS) ×2
SUCTION TUBE FRAZIER 10FR DISP (MISCELLANEOUS) ×1 IMPLANT
SUT ETHILON 4 0 PS 2 18 (SUTURE) IMPLANT
SUT MNCRL AB 4-0 PS2 18 (SUTURE) IMPLANT
SUT VIC AB 3-0 FS2 27 (SUTURE) IMPLANT
SYR CONTROL 10ML LL (SYRINGE) IMPLANT
SYSTEM CHEST DRAIN TLS 7FR (DRAIN) IMPLANT
TOWEL OR 17X24 6PK STRL BLUE (TOWEL DISPOSABLE) IMPLANT
TOWEL OR 17X26 10 PK STRL BLUE (TOWEL DISPOSABLE) ×3 IMPLANT
TUBE CONNECTING 12'X1/4 (SUCTIONS) ×1
TUBE CONNECTING 12X1/4 (SUCTIONS) ×2 IMPLANT
WATER STERILE IRR 1000ML POUR (IV SOLUTION) IMPLANT
YANKAUER SUCT BULB TIP NO VENT (SUCTIONS) IMPLANT

## 2016-02-10 NOTE — Transfer of Care (Signed)
Immediate Anesthesia Transfer of Care Note  Patient: Victoria CulverRachael Nguyen  Procedure(s) Performed: Procedure(s): LEFT SMALL FINGER DEEP IMPLANT REMOVAL (Left)  Patient Location: PACU  Anesthesia Type:MAC  Level of Consciousness: awake, alert  and oriented  Airway & Oxygen Therapy: Patient Spontanous Breathing  Post-op Assessment: Report given to RN, Post -op Vital signs reviewed and stable and Patient moving all extremities  Post vital signs: Reviewed and stable  Last Vitals:  Filed Vitals:   02/10/16 1340 02/10/16 1605  BP: 111/70   Pulse: 82   Temp: 36.8 C 36.6 C  Resp: 18     Last Pain: There were no vitals filed for this visit.       Complications: No apparent anesthesia complications

## 2016-02-10 NOTE — Discharge Instructions (Signed)
KEEP BANDAGE CLEAN AND DRY CALL OFFICE FOR F/U APPT 545-5000 in 6 days KEEP HAND ELEVATED ABOVE HEART OK TO APPLY ICE TO OPERATIVE AREA CONTACT OFFICE IF ANY WORSENING PAIN OR CONCERNS.  

## 2016-02-10 NOTE — Anesthesia Preprocedure Evaluation (Addendum)
Anesthesia Evaluation  Patient identified by MRN, date of birth, ID band Patient awake    Reviewed: Allergy & Precautions, NPO status , Patient's Chart, lab work & pertinent test results  History of Anesthesia Complications (+) PONV and history of anesthetic complications  Airway Mallampati: II  TM Distance: >3 FB Neck ROM: Full    Dental  (+) Teeth Intact, Dental Advisory Given, Caps,    Pulmonary asthma ,    Pulmonary exam normal breath sounds clear to auscultation       Cardiovascular Exercise Tolerance: Good negative cardio ROS Normal cardiovascular exam Rhythm:Regular Rate:Normal     Neuro/Psych  Headaches, PSYCHIATRIC DISORDERS Anxiety    GI/Hepatic negative GI ROS, Neg liver ROS,   Endo/Other  negative endocrine ROS  Renal/GU negative Renal ROS     Musculoskeletal negative musculoskeletal ROS (+)   Abdominal   Peds  Hematology negative hematology ROS (+)   Anesthesia Other Findings Day of surgery medications reviewed with the patient.  Reproductive/Obstetrics negative OB ROS                            Anesthesia Physical Anesthesia Plan  ASA: II  Anesthesia Plan: MAC   Post-op Pain Management:    Induction: Intravenous  Airway Management Planned: Nasal Cannula  Additional Equipment:   Intra-op Plan:   Post-operative Plan:   Informed Consent: I have reviewed the patients History and Physical, chart, labs and discussed the procedure including the risks, benefits and alternatives for the proposed anesthesia with the patient or authorized representative who has indicated his/her understanding and acceptance.   Dental advisory given  Plan Discussed with: CRNA and Anesthesiologist  Anesthesia Plan Comments: (Discussed risks/benefits/alternatives to MAC sedation including need for ventilatory support, hypotension, need for conversion to general anesthesia.  All patient  questions answered.  Patient/guardian wishes to proceed.)        Anesthesia Quick Evaluation

## 2016-02-10 NOTE — H&P (Cosign Needed)
[name] is an 25 y.o. female.   Chief Complaint: left small finger retained deep implants  HPI: Pt had surgery on 01/06/2016 Pt here for scheduled surgery to remove wires Pt has been in splint and cast since surgery  Past Medical History  Diagnosis Date  . Anxiety   . Head injury, closed     dizzy for a few minutes  . Complication of anesthesia   . PONV (postoperative nausea and vomiting)   . Asthma     exercise induced  . Headache 2012    - ? 1 TIME WAS EVALUTED by an MD,  HAD NUMBNESS LEFT SIDE , VISIUAL AND SPEECH DISTRUBANCE    Past Surgical History  Procedure Laterality Date  . Hand surgery Left 2009    thumb , fracture  . Open reduction internal fixation (orif) proximal phalanx Left 01/06/2016    Procedure: OPEN REDUCTION INTERNAL FIXATION (ORIF) LEFT SMALL FINGER;  Surgeon: Bradly Bienenstock, MD;  Location: [facility] OR;  Service: Orthopedics;  Laterality: Left;  . Hand surgery Left 2006    Pins placed    History reviewed. No pertinent family history. Social History:  reports that she has never smoked. She has never used smokeless tobacco. She reports that she drinks alcohol. She reports that she does not use illicit drugs.  Allergies:  Allergies  Allergen Reactions  . Bee Venom Anaphylaxis  . Adhesive [Tape] Itching and Rash    Medications Prior to Admission  Medication Sig Dispense Refill  . acetaminophen (TYLENOL) 500 MG tablet Take 500 mg by mouth every 6 (six) hours as needed for moderate pain.    . metroNIDAZOLE (FLAGYL) 500 MG tablet Take 500 mg by mouth 2 (two) times daily.    Marland Kitchen oxyCODONE-acetaminophen (ROXICET) 5-325 MG tablet Take 1 tablet by mouth every 4 (four) hours as needed for severe pain. 35 tablet 0  . albuterol (PROVENTIL HFA;VENTOLIN HFA) 108 (90 Base) MCG/ACT inhaler Inhale 2 puffs into the lungs every 4 (four) hours as needed for wheezing or shortness of breath. 1 Inhaler 2  . ibuprofen (ADVIL,MOTRIN) 800 MG tablet Take 1 tablet (800 mg total) by mouth  3 (three) times daily. 21 tablet 0    No results found for this or any previous visit (from the past 48 hour(s)). No results found.  ROS NO RECENT ILLNESSES OR HOSPITALIZATIONS  Blood pressure 111/70, pulse 82, temperature 98.3 F (36.8 C), temperature source Oral, resp. rate 18, height  (1.6 m), weight 76.204 kg (168 lb), last menstrual period 02/08/2016, SpO2 97 %. Physical Exam  General Appearance:  Alert, cooperative, no distress, appears stated age  Head:  Normocephalic, without obvious abnormality, atraumatic  Eyes:  Pupils equal, conjunctiva/corneas clear,         Throat: Lips, mucosa, and tongue normal; teeth and gums normal  Neck: No visible masses     Lungs:   respirations unlabored  Chest Wall:  No tenderness or deformity  Heart:  Regular rate and rhythm,  Abdomen:   Soft, non-tender,         Extremities: LEFT SMALL FINGER: FINGER WARM WELL PERFUSED LIMITED DIGITAL MOBILITY GOOD MOBILITY TO RING,INDEX AND THUMB, AND LONG FINGERS  Pulses: 2+ and symmetric  Skin: Skin color, texture, turgor normal, no rashes or lesions     Neurologic: Normal   Assessment/Plan LEFT SMALL FINGER RETAINED DEEP IMPLANT, BURIED K WIRES  LEFT SMALL FINGER DEEP IMPLANT REMOVAL  R/B/A DISCUSSED WITH PT IN OFFICE.  PT VOICED UNDERSTANDING OF PLAN  CONSENT SIGNED DAY OF SURGERY PT SEEN AND EXAMINED PRIOR TO OPERATIVE PROCEDURE/DAY OF SURGERY SITE MARKED. QUESTIONS ANSWERED WILL GO HOME FOLLOWING SURGERY  WE ARE PLANNING SURGERY FOR YOUR UPPER EXTREMITY. THE RISKS AND BENEFITS OF SURGERY INCLUDE BUT NOT LIMITED TO BLEEDING INFECTION, DAMAGE TO NEARBY NERVES ARTERIES TENDONS, FAILURE OF SURGERY TO ACCOMPLISH ITS INTENDED GOALS, PERSISTENT SYMPTOMS AND NEED FOR FURTHER SURGICAL INTERVENTION. WITH THIS IN MIND WE WILL PROCEED. I HAVE DISCUSSED WITH THE PATIENT THE PRE AND POSTOPERATIVE REGIMEN AND THE DOS AND DON'TS. PT VOICED UNDERSTANDING AND INFORMED CONSENT SIGNED.  Sharma CovertTMANN,Czar Ysaguirre  W 02/10/2016, 2:07 PM

## 2016-02-10 NOTE — Brief Op Note (Signed)
02/10/2016  2:09 PM  PATIENT:  Victoria Culverachael Nguyen  25 y.o. female  PRE-OPERATIVE DIAGNOSIS:  left small finger deep implant retention  POST-OPERATIVE DIAGNOSIS:  * No post-op diagnosis entered *  PROCEDURE:  Procedure(s): LEFT SMALL FINGER DEEP IMPLANT REMOVAL (Left)  SURGEON:  Surgeon(s) and Role:    * Bradly BienenstockFred Marielle Mantione, MD - Primary  PHYSICIAN ASSISTANT:   ASSISTANTS: none   ANESTHESIA:   general  EBL:     BLOOD ADMINISTERED:none  DRAINS: none   LOCAL MEDICATIONS USED:  NONE  SPECIMEN:  No Specimen  DISPOSITION OF SPECIMEN:  N/A  COUNTS:  YES  TOURNIQUET:    DICTATION: .Other Dictation: Dictation Number [id number]  PLAN OF CARE: Discharge to home after PACU  PATIENT DISPOSITION:  PACU - hemodynamically stable.   Delay start of Pharmacological VTE agent (>24hrs) due to surgical blood loss or risk of bleeding: not applicable

## 2016-02-10 NOTE — Transfer of Care (Deleted)
Immediate Anesthesia Transfer of Care Note  Patient: Victoria CulverRachael Liscano  Procedure(s) Performed: Procedure(s): LEFT SMALL FINGER DEEP IMPLANT REMOVAL (Left)  Patient Location: PACU  Anesthesia Type:General  Level of Consciousness: awake, alert  and oriented  Airway & Oxygen Therapy: Patient Spontanous Breathing  Post-op Assessment: Report given to RN and Post -op Vital signs reviewed and stable  Post vital signs: Reviewed and stable  Last Vitals:  Filed Vitals:   02/10/16 1340  BP: 111/70  Pulse: 82  Temp: 36.8 C  Resp: 18    Last Pain: There were no vitals filed for this visit.       Complications: No apparent anesthesia complications spinal level assessed L2

## 2016-02-11 ENCOUNTER — Encounter (HOSPITAL_COMMUNITY): Payer: Self-pay | Admitting: Orthopedic Surgery

## 2016-02-11 NOTE — Op Note (Signed)
[name], [name]                ACCOUNT NO.:  [id number]  MEDICAL RECORD NO.:  [id number]  LOCATION:  [facility]                         FACILITY:  [facility]  PHYSICIAN:  Sharma CovertFred W. Asencion Guisinger IV, M.D.DATE OF BIRTH:  14-Aug-1991  DATE OF PROCEDURE:  02/10/2016 DATE OF DISCHARGE:  02/10/2016                              OPERATIVE REPORT   PREOPERATIVE DIAGNOSIS:  Left small finger with deep implants, retained hardware and buried K-wires.  POSTOPERATIVE DIAGNOSIS:  Left small finger with deep implants, retained hardware and buried K-wires.  ATTENDING PHYSICIAN:  Sharma CovertFred W. Mikhael Hendriks IV, MD, who was scrubbed and present for the entire procedure.  ASSISTANT SURGEON:  None.  ANESTHESIA:  1% Xylocaine and 0.25% Marcaine, local block with IV sedation.  SURGICAL PROCEDURES: 1. Removal of deep buried implants, left small finger. 2. Radiographs, 3 views, left small finger.  SURGICAL INDICATION:  [name] is a right-hand-dominant female who sustained previously underwent open reduction and internal fixation. The patient is scheduled for removal of the deep buried implants. Risks, benefits and alternatives were discussed in detail with the patient and signed informed consent was obtained.  Risks include, but not limited to bleeding, infection, damage to nearby nerves, arteries, or tendons; loss of motion of the wrist and digits, incomplete relief of symptoms, and need for further surgical intervention.  DESCRIPTION OF PROCEDURE:  The patient was properly identified in the preoperative holding area, marked with a permanent marker made on the left small finger to indicate the correct operative site.  The patient was then brought back to the operating room and placed supine on the anesthesia table where the IV sedation was administered.  Local anesthetic was administered.  A well-padded tourniquet was placed on the left brachium and sealed with 1000-drape.  Left upper extremity was then prepped and  draped in normal sterile fashion.  Time-out was called, correct side was identified and procedure was then began.  Attention was then turned to the small finger.  After appropriate prepping and drape, the K-wire was then identified and deep dissection was carried down to remove the three buried K-wires.  These were then done, small stab incisions were made.  The wound was irrigated, K-wires were removed. Final radiographs of the finger were then taken.  Adaptic dressing, sterile compressive bandage were then applied.  The patient tolerated the procedure well.  INTRAOPERATIVE RADIOGRAPHS:  AP and lateral views of the finger did show the removal of the cross K-wires and the comminuted and proximal phalanx fracture.  PLAN:  The patient will be discharged to home, seen back in the office in approximately 7 days for wound check and then begin an outpatient therapy regimen.  We will write a prescription for the [facility] therapist to begin some active range of motion and removing quickly.  Radiographs at each visit.     Madelynn DoneFred W. Ammon Muscatello IV, M.D.     FWO/MEDQ  D:  02/10/2016  T:  02/11/2016  Job:  [id number]

## 2016-02-12 NOTE — Anesthesia Postprocedure Evaluation (Signed)
Anesthesia Post Note  Patient: Victoria CulverRachael Platas  Procedure(s) Performed: Procedure(s) (LRB): LEFT SMALL FINGER DEEP IMPLANT REMOVAL (Left)  Patient location during evaluation: PACU Anesthesia Type: MAC Level of consciousness: awake Pain management: pain level controlled Vital Signs Assessment: post-procedure vital signs reviewed and stable Respiratory status: spontaneous breathing Cardiovascular status: stable Postop Assessment: no signs of nausea or vomiting Anesthetic complications: no    Last Vitals:  Filed Vitals:   02/10/16 1730 02/10/16 1738  BP: 100/64   Pulse: 56   Temp:  36.6 C  Resp: 18     Last Pain:  Filed Vitals:   02/11/16 1423  PainSc: 4                  Garrick Midgley

## 2016-02-25 ENCOUNTER — Ambulatory Visit: Payer: Medicaid Other | Attending: Orthopedic Surgery | Admitting: Occupational Therapy

## 2016-02-25 ENCOUNTER — Encounter: Payer: Self-pay | Admitting: Occupational Therapy

## 2016-02-25 DIAGNOSIS — M25642 Stiffness of left hand, not elsewhere classified: Secondary | ICD-10-CM | POA: Insufficient documentation

## 2016-02-25 DIAGNOSIS — M6281 Muscle weakness (generalized): Secondary | ICD-10-CM | POA: Insufficient documentation

## 2016-02-25 DIAGNOSIS — R278 Other lack of coordination: Secondary | ICD-10-CM

## 2016-02-25 DIAGNOSIS — M25542 Pain in joints of left hand: Secondary | ICD-10-CM

## 2016-02-25 NOTE — Patient Instructions (Addendum)
DIP Flexion (Active Blocked)    Hold ______ finger firmly at the middle so that only the tip joint can bend. Hold ____ seconds. Repeat ____ times. Do ____ sessions per day.  Copyright  VHI. All rights reserved.  PIP Flexion (Active Blocked)    Hold large knuckle straight using other hand. Bend middle joint of ______ finger as far as possible. Hold ____ seconds. Repeat ____ times. Do ____ sessions per day. Activity: Curl fingers around a jar cap.*  Copyright  VHI. All rights reserved.  MP Flexion (Active Blocked)    Using other hand to brace base of small finger, bend as far as possible with tip joint held straight. Repeat ____ times. Do ____ sessions per day.  Copyright  VHI. All rights reserved.  Flexor Tendon Gliding (Active Hook Fist)    With fingers and knuckles straight, bend middle and tip joints. Do not bend large knuckles. Repeat ____ times. Do ____ sessions per day.  Copyright  VHI. All rights reserved.  Flexor Tendon Gliding (Active Full Fist)    Straighten all fingers, then make a fist, bending all joints. Repeat ____ times. Do ____ sessions per day.  Copyright  VHI. All rights reserved.

## 2016-02-25 NOTE — Therapy (Signed)
[facility] [address] [place], [place], [address] Phone: [phone]   Fax:  [phone]  Occupational Therapy Evaluation  Patient Details  Name: [name] MRN: [id number] Date of Birth: [birth year] Referring Provider: Dr Melvyn Novas  Encounter Date: 02/25/2016      OT End of Session - 02/25/16 1211    Visit Number 1   Number of Visits --  Eval + 3    Authorization Type Medicaid awaiting approval/authorization   OT Start Time 1009   OT Stop Time 1056   OT Time Calculation (min) 47 min   Equipment Utilized During Treatment Foam roll (red and brown)   Activity Tolerance Patient tolerated treatment well   Behavior During Therapy [facility] for tasks assessed/performed      Past Medical History  Diagnosis Date  . Anxiety   . Head injury, closed     dizzy for a few minutes  . Complication of anesthesia   . PONV (postoperative nausea and vomiting)   . Asthma     exercise induced  . Headache 2012    - ? 1 TIME WAS EVALUTED by an MD,  HAD NUMBNESS LEFT SIDE , VISIUAL AND SPEECH DISTRUBANCE    Past Surgical History  Procedure Laterality Date  . Hand surgery Left 2009    thumb , fracture  . Open reduction internal fixation (orif) proximal phalanx Left 01/06/2016    Procedure: OPEN REDUCTION INTERNAL FIXATION (ORIF) LEFT SMALL FINGER;  Surgeon: Bradly Bienenstock, MD;  Location: [facility] OR;  Service: Orthopedics;  Laterality: Left;  . Hand surgery Left 2006    Pins placed  . Hardware removal Left 02/10/2016    Procedure: LEFT SMALL FINGER DEEP IMPLANT REMOVAL;  Surgeon: Bradly Bienenstock, MD;  Location: [facility] OR;  Service: Orthopedics;  Laterality: Left;    There were no vitals filed for this visit.      Subjective Assessment - 02/25/16 1013    Subjective  Pt is a R HD female whom presents following removal of hardware from Left small finger PIPJ on 02/10/16. She originally fraceured her PIP of L small when playing flag football on  01/03/16. She had cross K-wires implanted to her communitued and proximal phalanx fracture on 01/06/16. Pt presents today for out-pt therapy for AROM and AAROM left small finger.     Patient Stated Goals Increased ROM and strength left hand.   Currently in Pain? No/denies   Pain Score 0-No pain           [facility] OT Assessment - 02/25/16 0001    Assessment   Diagnosis L small finger condylar fracture s/p K-wire removal on 02/10/16 (DOI: 01/03/16, original placement of ORIF 01/06/16).    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   ROM / Strength   AROM / PROM / Strength AROM;Strength   AROM   Overall AROM  Deficits   AROM Assessment Site Finger;Other (comment)  Distance to [facility] = 5.5cm   Right/Left Finger Left   Left Composite Finger Extension 75%   Left Composite Finger Flexion 25%   Strength   Overall Strength Deficits   Overall Strength Comments Pt is currently 5.5 cm from [facility] and unable to make full composite fist. Therefore, grip is impaired.   Strength Assessment Site Hand   Right/Left hand Left;Right   Right Hand Grip (lbs) 65   Left Hand Grip (lbs) 25   Left Hand AROM   L Little  MCP 0-90 62 Degrees   L Little PIP 0-100 26 Degrees   L Little DIP 0-70 36 Degrees  Active DIP extension = -20-36*; Passive -12-36*   Hand Function   Right Hand Gross Grasp Impaired   Right Hand Grip (lbs) --  See measurements above   Right Hand Lateral Pinch --  NT    Right Hand 3 Point Pinch --  NT   Left Hand Gross Grasp Impaired   Left Hand Grip (lbs) 25   Left Hand Lateral Pinch --  NT   Left 3 point pinch --  NT                  OT Treatments/Exercises (OP) - 02/25/16 0001    Exercises   Exercises Hand   Hand Exercises   Tendon Glides Left small finger tendon gliding, composite flexion w/ foam roll; active blocked flexion ex's and reverse blocking exercises. All ex's were performed in clinic today after instruction, demonstration and vc's left small finger. Handout provided.   Other Hand Exercises Coban wrap to left small finger for edema control. Pt was educated in [place] use, care and precautions.               OT Education - 02/25/16 1210    Education provided Yes   Education Details Blocked flexion and reverse blocking ex's; composite flexion w/ foam roll/washcloth, tendon gliding ex's left small finger; coban for edema management   Person(s) Educated Patient   Methods Explanation;Demonstration;Verbal cues;Handout   Comprehension Verbalized understanding;Returned demonstration;Need further instruction          OT Short Term Goals - 02/25/16 1226    OT SHORT TERM GOAL #1   Title Pt will be I HEP   Baseline VC's and TC's required   Time 3   Period Weeks   Status New   OT SHORT TERM GOAL #2   Title Pt will be I scar management    Baseline Dependent; Verbal and tactile cues, pt will be educated in this.   Time 3   Period Weeks   Status New   OT SHORT TERM GOAL #3   Title Pt will be Mod I edema management L hand   Baseline vc's and Min A coban use left small finger   Time 3   Period Weeks   Status New           OT Long Term Goals - 02/25/16 1235    OT LONG TERM GOAL #4   Title Pt will demonstrate improved grip strength left hand by 10# or greater for daily activities/homemaking   Baseline 25# left hand at Eval vs 65# right hand at eval   Time 8   Period Weeks   Status New                Plan - 02/25/16 1215    Clinical Impression Statement Pt is a 25 y/o female whom presents s/p left small finger  deep implant removal following K-wires after comminuted condylar fracture proximal phalanx ICD10 M25.642; R27.8; R60.1; [id number]; M25.542 (DOI: 3/36/17; DOS initial surgery 01/06/16 with removal of hardware 02/10/16). She presents with significant impairement and limitation in AROM left small finger impacting her ability to make a fist, grasp or use left hand for functonal activites  She wil lbenefit from out-pt OT to assist in maximizing independence wit hAROM, edema control, strengthening etc to return to functional use left non-dominant hand..   Rehab Potential Good   OT Frequency 2x / week   OT Duration 8 weeks  Recommend above, however will most likely be approved for only 3 visits over 8 weeks due to insurance limitations.   OT Treatment/Interventions Self-care/ADL training;Therapeutic exercise;Patient/family education;Splinting;Ultrasound;Therapeutic exercises;Therapeutic activities;Fluidtherapy;Scar mobilization;Passive range of motion;Moist Heat;Parrafin   Plan Parrafin, Review home program, upgrade ex's for increased tendon gliding, scar mobilization, edema control left small finger.   Consulted and Agree with Plan of Care Patient      Patient will benefit from skilled therapeutic intervention in order to improve the following deficits and impairments:  Decreased coordination, Decreased range of motion, Impaired flexibility, Increased edema, Pain, Impaired UE functional use, Decreased scar mobility, Decreased strength  Visit Diagnosis: Stiffness of left hand, not elsewhere classified - Plan: Ot plan of care cert/re-cert  Muscle weakness (generalized) - Plan: Ot plan of care cert/re-cert  Other lack of coordination - Plan: Ot plan of care cert/re-cert  Pain in joint of left hand - Plan: Ot plan of care cert/re-cert    Problem List There are no active problems to display for  this patient.   Mariam DollarBarnhill, Amy Beth Dixon, OTR/L 02/25/2016, 12:41 PM  [facility] [address] [place], [place], [address] Phone: [phone]   Fax:  [phone]  Name: Victoria CulverRachael Nguyen MRN: [id number] Date of Birth: 05/27/1991

## 2016-03-05 ENCOUNTER — Emergency Department (HOSPITAL_COMMUNITY)
Admission: EM | Admit: 2016-03-05 | Discharge: 2016-03-05 | Disposition: A | Discharge status: Home / Self Care | Payer: Medicaid Other | Attending: Emergency Medicine | Admitting: Emergency Medicine

## 2016-03-05 ENCOUNTER — Encounter (HOSPITAL_COMMUNITY): Payer: Self-pay

## 2016-03-05 DIAGNOSIS — N926 Irregular menstruation, unspecified: Secondary | ICD-10-CM

## 2016-03-05 DIAGNOSIS — J45909 Unspecified asthma, uncomplicated: Secondary | ICD-10-CM | POA: Insufficient documentation

## 2016-03-05 DIAGNOSIS — Z79899 Other long term (current) drug therapy: Secondary | ICD-10-CM | POA: Insufficient documentation

## 2016-03-05 LAB — POC URINE PREG, ED: Preg Test, Ur: NEGATIVE

## 2016-03-05 NOTE — ED Provider Notes (Signed)
CSN: [id number]     Arrival date & time 03/05/16  1232 History   First MD Initiated Contact with Patient 03/05/16 1305     Chief Complaint  Patient presents with  . request pregnancy test      (Consider location/radiation/quality/duration/timing/severity/associated sxs/prior Treatment) HPI   Judieth Mckown is a 25 year old female with no significant past medical history who presents to the ED today requesting a pregnancy test. Patient states that last month she had consistent unprotected sex while she was ovulating. Patient states she had a very light period which is unusual for her. She states that she took a home test that was negative but wanted to have a confirmation test here in the ED. Patient is not on birth control.  Past Medical History  Diagnosis Date  . Anxiety   . Head injury, closed     dizzy for a few minutes  . Complication of anesthesia   . PONV (postoperative nausea and vomiting)   . Asthma     exercise induced  . Headache 2012    - ? 1 TIME WAS EVALUTED by an MD,  HAD NUMBNESS LEFT SIDE , VISIUAL AND SPEECH DISTRUBANCE   Past Surgical History  Procedure Laterality Date  . Hand surgery Left 2009    thumb , fracture  . Open reduction internal fixation (orif) proximal phalanx Left 01/06/2016    Procedure: OPEN REDUCTION INTERNAL FIXATION (ORIF) LEFT SMALL FINGER;  Surgeon: Bradly Bienenstock, MD;  Location: [facility];  Service: Orthopedics;  Laterality: Left;  . Hand surgery Left 2006    Pins placed  . Hardware removal Left 02/10/2016    Procedure: LEFT SMALL FINGER DEEP IMPLANT REMOVAL;  Surgeon: Bradly Bienenstock, MD;  Location: [facility] OR;  Service: Orthopedics;  Laterality: Left;   No family history on file. Social History  Substance Use Topics  . Smoking status: Never Smoker   . Smokeless tobacco: Never Used  . Alcohol Use: Yes     Comment: on special ocassions, could be a lot but not always   OB History    No data available     Review of Systems  All other systems  reviewed and are negative.     Allergies  Bee venom and Adhesive  Home Medications   Prior to Admission medications   Medication Sig Start Date End Date Taking? Authorizing Provider  acetaminophen (TYLENOL) 500 MG tablet Take 500 mg by mouth every 6 (six) hours as needed for moderate pain.    Historical Provider, MD  albuterol (PROVENTIL HFA;VENTOLIN HFA) 108 (90 Base) MCG/ACT inhaler Inhale 2 puffs into the lungs every 4 (four) hours as needed for wheezing or shortness of breath. 12/06/15   Charm Rings, MD  ibuprofen (ADVIL,MOTRIN) 800 MG tablet Take 1 tablet (800 mg total) by mouth 3 (three) times daily. 01/03/16   Danelle Berry, PA-C  metroNIDAZOLE (FLAGYL) 500 MG tablet Take 500 mg by mouth 2 (two) times daily. Reported on 02/25/2016    Historical Provider, MD  oxyCODONE-acetaminophen (ROXICET) 5-325 MG tablet Take 1 tablet by mouth every 4 (four) hours as needed for severe pain. 01/06/16   Bradly Bienenstock, MD   BP 118/67 mmHg  Pulse 83  Temp(Src) 98.3 F (36.8 C) (Oral)  Resp 18  Ht  (1.6 m)  Wt 77.565 kg  BMI 30.30 kg/m2  SpO2 98%  LMP 02/08/2016 Physical Exam  Constitutional: She is oriented to person, place, and time. She appears well-developed and well-nourished. No distress.  HENT:  Head: Normocephalic and atraumatic.  Eyes: Conjunctivae are normal. Right eye exhibits no discharge. Left eye exhibits no discharge. No scleral icterus.  Cardiovascular: Normal rate.   Pulmonary/Chest: Effort normal.  Abdominal: Soft. There is no tenderness.  Neurological: She is alert and oriented to person, place, and time. Coordination normal.  Skin: Skin is warm and dry. No rash noted. She is not diaphoretic. No erythema. No pallor.  Psychiatric: She has a normal mood and affect. Her behavior is normal.  Nursing note and vitals reviewed.   ED Course  Procedures (including critical care time) Labs Review Labs Reviewed  POC URINE PREG, ED    Imaging Review No results found. I  have personally reviewed and evaluated these images and lab results as part of my medical decision-making.   EKG Interpretation None      MDM   Final diagnoses:  Menstrual periods irregular   Pregnancy test is negative. Patient is otherwise asymptomatic and appears well in ED. No vaginal discharge. No abdominal tenderness. Follow-up with OB/GYN. Encouraged safe sex practices.    Lester KinsmanSamantha Tripp [place], PA-C 03/05/16 1426  Glynn OctaveStephen Rancour, MD 03/05/16 1700

## 2016-03-05 NOTE — Discharge Instructions (Signed)
Abnormal Uterine Bleeding Abnormal uterine bleeding can affect women at various stages in life, including teenagers, women in their reproductive years, pregnant women, and women who have reached menopause. Several kinds of uterine bleeding are considered abnormal, including:  Bleeding or spotting between periods.   Bleeding after sexual intercourse.   Bleeding that is heavier or more than normal.   Periods that last longer than usual.  Bleeding after menopause.  Many cases of abnormal uterine bleeding are minor and simple to treat, while others are more serious. Any type of abnormal bleeding should be evaluated by your health care provider. Treatment will depend on the cause of the bleeding. HOME CARE INSTRUCTIONS Monitor your condition for any changes. The following actions may help to alleviate any discomfort you are experiencing:  Avoid the use of tampons and douches as directed by your health care provider.  Change your pads frequently. You should get regular pelvic exams and Pap tests. Keep all follow-up appointments for diagnostic tests as directed by your health care provider.  SEEK MEDICAL CARE IF:   Your bleeding lasts more than 1 week.   You feel dizzy at times.  SEEK IMMEDIATE MEDICAL CARE IF:   You pass out.   You are changing pads every 15 to 30 minutes.   You have abdominal pain.  You have a fever.   You become sweaty or weak.   You are passing large blood clots from the vagina.   You start to feel nauseous and vomit. MAKE SURE YOU:   Understand these instructions.  Will watch your condition.  Will get help right away if you are not doing well or get worse.   This information is not intended to replace advice given to you by your health care provider. Make sure you discuss any questions you have with your health care provider.   Your pregnancy test today was negative. Follow up with women's health for further evaluation. Return to the ER if  you experience severe worsening of her symptoms, abdominal pain, fevers, chills, vaginal discharge.

## 2016-03-05 NOTE — ED Notes (Signed)
Declined W/C at D/C and was escorted to lobby by RN. 

## 2016-03-05 NOTE — ED Notes (Signed)
Patient here requesting pregnancy test for abdominal cramping and very light period this past month. No other associated symptoms. Negative home test

## 2016-03-19 ENCOUNTER — Emergency Department (HOSPITAL_COMMUNITY): Payer: Medicaid Other

## 2016-03-19 ENCOUNTER — Encounter (HOSPITAL_COMMUNITY): Payer: Self-pay | Admitting: Emergency Medicine

## 2016-03-19 ENCOUNTER — Emergency Department (HOSPITAL_COMMUNITY)
Admission: EM | Admit: 2016-03-19 | Discharge: 2016-03-19 | Disposition: A | Discharge status: Home / Self Care | Payer: Medicaid Other | Attending: Emergency Medicine | Admitting: Emergency Medicine

## 2016-03-19 DIAGNOSIS — S63289A Dislocation of proximal interphalangeal joint of unspecified finger, initial encounter: Secondary | ICD-10-CM

## 2016-03-19 DIAGNOSIS — Z79899 Other long term (current) drug therapy: Secondary | ICD-10-CM | POA: Insufficient documentation

## 2016-03-19 DIAGNOSIS — Y9361 Activity, american tackle football: Secondary | ICD-10-CM | POA: Insufficient documentation

## 2016-03-19 DIAGNOSIS — W2101XA Struck by football, initial encounter: Secondary | ICD-10-CM | POA: Insufficient documentation

## 2016-03-19 DIAGNOSIS — Y999 Unspecified external cause status: Secondary | ICD-10-CM | POA: Insufficient documentation

## 2016-03-19 DIAGNOSIS — J45909 Unspecified asthma, uncomplicated: Secondary | ICD-10-CM | POA: Insufficient documentation

## 2016-03-19 DIAGNOSIS — Y929 Unspecified place or not applicable: Secondary | ICD-10-CM | POA: Insufficient documentation

## 2016-03-19 DIAGNOSIS — S63286A Dislocation of proximal interphalangeal joint of right little finger, initial encounter: Secondary | ICD-10-CM | POA: Insufficient documentation

## 2016-03-19 DIAGNOSIS — S6991XA Unspecified injury of right wrist, hand and finger(s), initial encounter: Secondary | ICD-10-CM

## 2016-03-19 MED ORDER — OXYCODONE-ACETAMINOPHEN 5-325 MG PO TABS
1.0000 | ORAL_TABLET | Freq: Once | ORAL | Status: AC
  Administered 2016-03-19: 1 via ORAL
  Filled 2016-03-19: qty 1

## 2016-03-19 MED ORDER — OXYCODONE HCL 5 MG PO TABS: 5.0000 mg | ORAL_TABLET | ORAL | Status: DC | PRN

## 2016-03-19 MED ORDER — LIDOCAINE HCL (PF) 1 % IJ SOLN
5.0000 mL | Freq: Once | INTRAMUSCULAR | Status: AC
  Administered 2016-03-19: 5 mL

## 2016-03-19 MED ORDER — IBUPROFEN 600 MG PO TABS: 600.0000 mg | ORAL_TABLET | Freq: Four times a day (QID) | ORAL | Status: DC | PRN

## 2016-03-19 NOTE — ED Provider Notes (Signed)
History  By signing my name below, I, [name], attest that this documentation has been prepared under the direction and in the presence of Melburn HakeNicole Filiberto Wamble, [place]. Electronically Signed: [name], ED Scribe. 03/19/2016. 10:48 PM.  Chief Complaint  Patient presents with  . Finger Injury   The history is provided by the patient and medical records. No language interpreter was used.    HPI Comments:  [name] is a 25 y.o. female who presents to the Emergency Department complaining of an injury to the right fifth digit that she sustained about one hour ago. Pt states she was playing football, tried to catch the ball, causing it to jam. She reports associated swelling and bruising of the right 5th digit. She has not taken anything for pain but an ice pack has been applied in triage. Touching or attempting to move the right fifth digit increases the pain. She denies alleviating factors. She denies wounds, numbness, tingling or weakness of the right fifth digit or right hand. She reports having a hand surgeon, Dr. Melvyn Novasrtmann secondary to fracturing the left fifth digit about three months ago.  Past Medical History  Diagnosis Date  . Anxiety   . Head injury, closed     dizzy for a few minutes  . Complication of anesthesia   . PONV (postoperative nausea and vomiting)   . Asthma     exercise induced  . Headache 2012    - ? 1 TIME WAS EVALUTED by an MD,  HAD NUMBNESS LEFT SIDE , VISIUAL AND SPEECH DISTRUBANCE   Past Surgical History  Procedure Laterality Date  . Hand surgery Left 2009    thumb , fracture  . Open reduction internal fixation (orif) proximal phalanx Left 01/06/2016    Procedure: OPEN REDUCTION INTERNAL FIXATION (ORIF) LEFT SMALL FINGER;  Surgeon: Bradly BienenstockFred Ortmann, MD;  Location: [facility] OR;  Service: Orthopedics;  Laterality: Left;  . Hand surgery Left 2006    Pins placed  . Hardware removal Left 02/10/2016    Procedure: LEFT SMALL FINGER DEEP IMPLANT REMOVAL;  Surgeon: Bradly BienenstockFred  Ortmann, MD;  Location: [facility] OR;  Service: Orthopedics;  Laterality: Left;   History reviewed. No pertinent family history. Social History  Substance Use Topics  . Smoking status: Never Smoker   . Smokeless tobacco: Never Used  . Alcohol Use: Yes     Comment: on special ocassions, could be a lot but not always   OB History    No data available     Review of Systems  Musculoskeletal: Positive for joint swelling and arthralgias.  Skin: Positive for color change. Negative for wound.  Neurological: Negative for weakness and numbness.    Allergies  Bee venom and Adhesive  Home Medications   Prior to Admission medications   Medication Sig Start Date End Date Taking? Authorizing Provider  acetaminophen (TYLENOL) 500 MG tablet Take 500 mg by mouth every 6 (six) hours as needed for moderate pain.    Historical Provider, MD  albuterol (PROVENTIL HFA;VENTOLIN HFA) 108 (90 Base) MCG/ACT inhaler Inhale 2 puffs into the lungs every 4 (four) hours as needed for wheezing or shortness of breath. 12/06/15   Charm RingsErin J Honig, MD  ibuprofen (ADVIL,MOTRIN) 800 MG tablet Take 1 tablet (800 mg total) by mouth 3 (three) times daily. 01/03/16   Danelle BerryLeisa Tapia, PA-C  metroNIDAZOLE (FLAGYL) 500 MG tablet Take 500 mg by mouth 2 (two) times daily. Reported on 02/25/2016    Historical Provider, MD  oxyCODONE-acetaminophen (ROXICET) 5-325 MG tablet  Take 1 tablet by mouth every 4 (four) hours as needed for severe pain. 01/06/16   Bradly Bienenstock, MD   Triage Vitals: BP 126/76 mmHg  Pulse 110  Temp(Src) 99 F (37.2 C) (Oral)  Resp 16  SpO2 100% Physical Exam  Constitutional: She is oriented to person, place, and time. She appears well-developed and well-nourished.  HENT:  Head: Normocephalic and atraumatic.  Eyes: Conjunctivae and EOM are normal. Right eye exhibits no discharge. Left eye exhibits no discharge. No scleral icterus.  Cardiovascular:  Cap refill less than 2 seconds. Right radial pulse 2+.  Pulmonary/Chest:  Effort normal.  Musculoskeletal: She exhibits tenderness.  Right pinky finger with deformity noted to proximal phalanx. Tenderness to palpation at right fifth proximal and middle phalanx. Decreased ROM of right fifth digit. Full ROM of right fifth MCP. Full ROM of right hand, wrist and elbow. Mild swelling and ecchymosis noted to right pinky finger. No abrasions or lacerations noted.  Neurological: She is alert and oriented to person, place, and time.  Sensations grossly intact.  Nursing note and vitals reviewed.   ED Course  Reduction of dislocation Date/Time: 03/19/2016 9:42 PM Performed by: Barrett Henle Authorized by: Barrett Henle Consent: Verbal consent obtained. Risks and benefits: risks, benefits and alternatives were discussed Consent given by: patient Patient understanding: patient states understanding of the procedure being performed Patient identity confirmed: verbally with patient Local anesthesia used: yes Anesthesia: digital block Local anesthetic: lidocaine 1% without epinephrine Anesthetic total: 3 ml Patient tolerance: Patient tolerated the procedure well with no immediate complications Comments: Right 5th PIP joint was reduced without any complications. Repeat xray ordered to confirm reduction.    (including critical care time) DIAGNOSTIC STUDIES: Oxygen Saturation is 100% on RA, normal by my interpretation.   COORDINATION OF CARE: 8:06 PM- Will order pain medication and X-Ray of right fifth digit. Pt verbalizes understanding and agrees to plan.  10:47 PM- Spoke with on call [facility] Orthopedic specialist and per his instructions, pt will be placed in splint and instructed to follow up with Dr. Melvyn Novas in the next few days.   Medications  oxyCODONE-acetaminophen (PERCOCET/ROXICET) 5-325 MG per tablet 1 tablet (1 tablet Oral Given 03/19/16 2017)  lidocaine (PF) (XYLOCAINE) 1 % injection 5 mL (5 mLs Infiltration Given 03/19/16 2121)     Labs Review Labs Reviewed - No data to display  Imaging Review Dg Finger Little Right  03/19/2016  CLINICAL DATA:  25 year old female status post reduction of right fifth digit. EXAM: RIGHT LITTLE FINGER 2+V COMPARISON:  Earlier radiograph dated 03/19/2016 FINDINGS: There has been interval reduction of the previously seen dislocated PIP of the fifth digit, now in anatomic alignment. No fracture or dislocation identified on this radiograph. The bones are well mineralized. The soft tissue swelling of the fifth digit. IMPRESSION: Interval reduction of the previously seen dislocated PIP of the fifth digit. Electronically Signed   By: Elgie Collard M.D.   On: 03/19/2016 22:02   Dg Finger Little Right  03/19/2016  CLINICAL DATA:  Playing football, ball hit tip of little finger. Right little finger deformity EXAM: RIGHT LITTLE FINGER 2+V COMPARISON:  None. FINDINGS: Soft tissue swelling. Middle phalanx completely dislocated dorsally on proximal phalanx with significant ulnar displacement as well. There is fracture Involving the base of the middle phalanx. There is also a fracture fragment now located anterior to the head of the proximal phalanx. IMPRESSION: Fracture/ dislocation at the PIP joint Electronically Signed   By: Edgar Frisk.D.  On: 03/19/2016 20:38   I have personally reviewed and evaluated these images and lab results as part of my medical decision-making.   EKG Interpretation None      MDM   Final diagnoses:  Injury of right little finger   Patient presents with right pinky finger pain and deformity after getting hit in the hand while playing football around 7 PM. VSS. Exam revealed deformity to right pinky finger with tenderness over proximal and middle phalanx, decreased range of motion due to reported pain, mild swelling and ecchymoses noted. Right upper extremity neurovascular intact. No other signs of injury or trauma. Patient given pain meds in the ED. Right little  finger x-ray revealed middle phalanx completely dislocated dorsally on proximal phalanx with significant ulnar displacement, fracture involving base of middle phalanx. Digital block performed prior to joint reduction. PIP joint was reduced without any complications, right UE neurovascularly intact s/p reduction. Dorsal finger splint applied with buddy tape. Repeat x-ray status post reduction showed reduction of previously seen dislocation without any fracture. Consulted hand surgery on call for Dr. Bari Edward group. Recommend following up with Dr. Orlan Leavens earlier this week and placing patient in a dorsal finger splint. Discussed results of plan for discharge with patient. Plan to discharge patient home with pain meds, symptomatic treatment and RICE protocol. Discussed return precautions with patient.  I personally performed the services described in this documentation, which was scribed in my presence. The recorded information has been reviewed and is accurate.     Satira Sark [place], [place] 03/19/16 Ouida Sills  Loren Racer, MD 03/20/16 1335

## 2016-03-19 NOTE — ED Notes (Signed)
Patient transported to x-ray. ?

## 2016-03-19 NOTE — ED Notes (Signed)
Pt arrives by POV with right 5th finger injury, obvious deformity, swelling and bruising present. Pt was playing football and finger was hit with ball.

## 2016-03-19 NOTE — Discharge Instructions (Signed)
Take your medications as prescribed as needed for pain relief. I also recommend continuing to rest, elevate and ice affected hand for 15-20 minutes 3-4 times daily to help with pain and swelling. Keep the finger splint and buddy tape on your fingers until you follow up with orthopedics. Follow-up with Dr. Orlan Leavensrtman within the next 2-3 days. Return to the emergency department if symptoms worsen or new onset of fever, swelling, redness, numbness, tingling, weakness.

## 2016-04-04 ENCOUNTER — Inpatient Hospital Stay (HOSPITAL_COMMUNITY)
Admission: AD | Admit: 2016-04-04 | Discharge: 2016-04-04 | Disposition: A | Discharge status: Home / Self Care | Payer: PRIVATE HEALTH INSURANCE | Source: Ambulatory Visit | Attending: Obstetrics and Gynecology | Admitting: Obstetrics and Gynecology

## 2016-04-04 ENCOUNTER — Encounter (HOSPITAL_COMMUNITY): Payer: Self-pay | Admitting: *Deleted

## 2016-04-04 DIAGNOSIS — O039 Complete or unspecified spontaneous abortion without complication: Secondary | ICD-10-CM | POA: Diagnosis not present

## 2016-04-04 DIAGNOSIS — A499 Bacterial infection, unspecified: Secondary | ICD-10-CM

## 2016-04-04 DIAGNOSIS — N760 Acute vaginitis: Secondary | ICD-10-CM | POA: Diagnosis not present

## 2016-04-04 DIAGNOSIS — B9689 Other specified bacterial agents as the cause of diseases classified elsewhere: Secondary | ICD-10-CM

## 2016-04-04 DIAGNOSIS — J45909 Unspecified asthma, uncomplicated: Secondary | ICD-10-CM | POA: Insufficient documentation

## 2016-04-04 DIAGNOSIS — F419 Anxiety disorder, unspecified: Secondary | ICD-10-CM | POA: Insufficient documentation

## 2016-04-04 DIAGNOSIS — Z3A01 Less than 8 weeks gestation of pregnancy: Secondary | ICD-10-CM | POA: Insufficient documentation

## 2016-04-04 HISTORY — DX: Anemia, unspecified: D64.9

## 2016-04-04 LAB — HCG, QUANTITATIVE, PREGNANCY: HCG, BETA CHAIN, QUANT, S: 8 m[IU]/mL — AB (ref <5)

## 2016-04-04 LAB — URINALYSIS, ROUTINE W REFLEX MICROSCOPIC
Bilirubin Urine: NEGATIVE
Glucose, UA: NEGATIVE mg/dL
Ketones, ur: NEGATIVE mg/dL
LEUKOCYTES UA: NEGATIVE
NITRITE: NEGATIVE
Protein, ur: NEGATIVE mg/dL
pH: 6 (ref 5.0–8.0)

## 2016-04-04 LAB — POCT PREGNANCY, URINE: PREG TEST UR: NEGATIVE

## 2016-04-04 LAB — ABO/RH: ABO/RH(D): B POS

## 2016-04-04 LAB — URINE MICROSCOPIC-ADD ON

## 2016-04-04 LAB — WET PREP, GENITAL
Sperm: NONE SEEN
Trich, Wet Prep: NONE SEEN
YEAST WET PREP: NONE SEEN

## 2016-04-04 MED ORDER — METRONIDAZOLE 500 MG PO TABS: 500.0000 mg | ORAL_TABLET | Freq: Two times a day (BID) | ORAL | Status: DC

## 2016-04-04 MED ORDER — IBUPROFEN 600 MG PO TABS: 600.0000 mg | ORAL_TABLET | Freq: Four times a day (QID) | ORAL | Status: DC | PRN

## 2016-04-04 NOTE — MAU Provider Note (Signed)
History     CSN: [id number]  Arrival date and time: 04/04/16 1046   First Provider Initiated Contact with Patient 04/04/16 1121      Chief Complaint  Patient presents with  . Vaginal Bleeding   HPI   [name] is a 25 y.o. female G1P0 at 6816w5d Here with vaginal bleeding. She had a positive home pregnancy test last Wednesday. She started having vaginal bleeding yesterday morning at 0400. The bleeding is light, heavier than spotting. Everytime she uses the bathroom she passes a blood clot.   LMP was 5/1 and then started having bleeding again on 5/24. 5/24 would have been the start of her normal menstrual cycle. She denies pain at this time.     OB History    No data available      Past Medical History  Diagnosis Date  . Anxiety   . Head injury, closed     dizzy for a few minutes  . Complication of anesthesia   . PONV (postoperative nausea and vomiting)   . Asthma     exercise induced  . Anemia     Past Surgical History  Procedure Laterality Date  . Hand surgery Left 2009    thumb , fracture  . Open reduction internal fixation (orif) proximal phalanx Left 01/06/2016    Procedure: OPEN REDUCTION INTERNAL FIXATION (ORIF) LEFT SMALL FINGER;  Surgeon: Bradly BienenstockFred Ortmann, MD;  Location: [facility] OR;  Service: Orthopedics;  Laterality: Left;  . Hand surgery Left 2006    Pins placed  . Hardware removal Left 02/10/2016    Procedure: LEFT SMALL FINGER DEEP IMPLANT REMOVAL;  Surgeon: Bradly BienenstockFred Ortmann, MD;  Location: [facility] OR;  Service: Orthopedics;  Laterality: Left;    History reviewed. No pertinent family history.  Social History  Substance Use Topics  . Smoking status: Never Smoker   . Smokeless tobacco: Never Used  . Alcohol Use: Yes     Comment: on special ocassions, could be a lot but not always    Allergies:  Allergies  Allergen Reactions  . Bee Venom Anaphylaxis  . Adhesive [Tape] Itching and Rash    Prescriptions prior to admission  Medication Sig Dispense Refill Last  Dose  . acetaminophen (TYLENOL) 500 MG tablet Take 500 mg by mouth every 6 (six) hours as needed for moderate pain.   Past Month at Unknown time  . albuterol (PROVENTIL HFA;VENTOLIN HFA) 108 (90 Base) MCG/ACT inhaler Inhale 2 puffs into the lungs every 4 (four) hours as needed for wheezing or shortness of breath. 1 Inhaler 2 More than a month at Unknown time  . ibuprofen (ADVIL,MOTRIN) 600 MG tablet Take 1 tablet (600 mg total) by mouth every 6 (six) hours as needed. 30 tablet 0   . metroNIDAZOLE (FLAGYL) 500 MG tablet Take 500 mg by mouth 2 (two) times daily. Reported on 02/25/2016   Not Taking  . oxyCODONE (ROXICODONE) 5 MG immediate release tablet Take 1 tablet (5 mg total) by mouth every 4 (four) hours as needed for severe pain. 15 tablet 0   . oxyCODONE-acetaminophen (ROXICET) 5-325 MG tablet Take 1 tablet by mouth every 4 (four) hours as needed for severe pain. 35 tablet 0 Past Week at Unknown time   Results for orders placed or performed during the hospital encounter of 04/04/16 (from the past 48 hour(s))  Urinalysis, Routine w reflex microscopic (not at [facility])     Status: Abnormal   Collection Time: 04/04/16 10:57 AM  Result Value Ref Range  Color, Urine YELLOW YELLOW   APPearance CLEAR CLEAR   Specific Gravity, Urine <1.005 (L) 1.005 - 1.030   pH 6.0 5.0 - 8.0   Glucose, UA NEGATIVE NEGATIVE mg/dL   Hgb urine dipstick LARGE (A) NEGATIVE   Bilirubin Urine NEGATIVE NEGATIVE   Ketones, ur NEGATIVE NEGATIVE mg/dL   Protein, ur NEGATIVE NEGATIVE mg/dL   Nitrite NEGATIVE NEGATIVE   Leukocytes, UA NEGATIVE NEGATIVE  Urine microscopic-add on     Status: Abnormal   Collection Time: 04/04/16 10:57 AM  Result Value Ref Range   Squamous Epithelial / LPF 0-5 (A) NONE SEEN   WBC, UA 0-5 0 - 5 WBC/hpf   RBC / HPF 0-5 0 - 5 RBC/hpf   Bacteria, UA RARE (A) NONE SEEN  Pregnancy, urine POC     Status: None   Collection Time: 04/04/16 11:04 AM  Result Value Ref Range   Preg Test, Ur NEGATIVE  NEGATIVE    Comment:        THE SENSITIVITY OF THIS METHODOLOGY IS >24 mIU/mL   ABO/Rh     Status: None   Collection Time: 04/04/16 11:28 AM  Result Value Ref Range   ABO/RH(D) B POS   hCG, quantitative, pregnancy     Status: Abnormal   Collection Time: 04/04/16 11:29 AM  Result Value Ref Range   hCG, Beta Chain, Quant, S 8 (H) <5 mIU/mL    Comment:          GEST. AGE      CONC.  (mIU/mL)   <=1 WEEK        5 - 50     2 WEEKS       50 - 500     3 WEEKS       100 - 10,000     4 WEEKS     1,000 - 30,000     5 WEEKS     3,500 - 115,000   6-8 WEEKS     12,000 - 270,000    12 WEEKS     15,000 - 220,000        FEMALE AND NON-PREGNANT FEMALE:     LESS THAN 5 mIU/mL   Wet prep, genital     Status: Abnormal   Collection Time: 04/04/16 11:30 AM  Result Value Ref Range   Yeast Wet Prep HPF POC NONE SEEN NONE SEEN   Trich, Wet Prep NONE SEEN NONE SEEN   Clue Cells Wet Prep HPF POC PRESENT (A) NONE SEEN   WBC, Wet Prep HPF POC FEW (A) NONE SEEN    Comment: FEW BACTERIA SEEN   Sperm NONE SEEN     Review of Systems  Gastrointestinal: Negative for nausea, vomiting and abdominal pain.  Neurological: Negative for dizziness.   Physical Exam   Blood pressure 112/62, pulse 70, temperature 98.2 F (36.8 C), temperature source Oral, resp. rate 18, height 5\' 3"  (1.6 m), weight 172 lb (78.019 kg), last menstrual period 03/02/2016.  Physical Exam  Constitutional: She is oriented to person, place, and time. She appears well-developed and well-nourished. No distress.  HENT:  Head: Normocephalic.  GI: Soft. She exhibits no distension. There is no tenderness. There is no rebound and no guarding.  Genitourinary:  Speculum exam: Vagina - Small amount of dark red blood, +odor Cervix - +active bleeding, small amount  Bimanual exam: Cervix closed Uterus non tender, normal size Adnexa non tender, no masses bilaterally GC/Chlam, wet prep done Chaperone present for exam.  Musculoskeletal: Normal  range of motion.  Neurological: She is alert and oriented to person, place, and time.  Skin: Skin is warm. She is not diaphoretic.  Psychiatric: Her behavior is normal.    MAU Course  Procedures  none  MDM  B positive blood type  Beta hcg   Assessment and Plan   A:  1. BV (bacterial vaginosis)   2. SAB (spontaneous abortion)     P:  Discharge home in stable condition Rx: Flagyl- no alcohol Follow up in the woc in 1-2 weeks, message sent to clinic Bleeding precautions Pelvic rest Support given.    Duane Lope, NP 04/04/2016 11:29 AM

## 2016-04-04 NOTE — Discharge Instructions (Signed)
Antibiotic Medicine °Antibiotic medicines are used to treat infections caused by bacteria. They work by injuring or killing the bacteria that is making you sick. °HOW IS AN ANTIBIOTIC CHOSEN? °An antibiotic is chosen based on many factors. To help your health care provider choose one for you, tell your health care provider if: °· You have any allergies. °· You are pregnant or plan to get pregnant. °· You are breastfeeding. °· You are taking any medicines. These include over-the-counter medicines, prescription medicines, and herbal remedies. °· You have a medical condition or problem you have not already discussed. °Your health care provider will also consider: °· How often the medicine has to be taken. °· Common side effects of the medicine. °· The cost of the medicine. °· The taste of the medicine. °If you have questions about why an antibiotic was chosen, make sure to ask. °FOR HOW LONG SHOULD I TAKE MY ANTIBIOTIC? °Continue to take your antibiotic for as long as told by your health care provider. Do not stop taking it when you feel better. If you stop taking it too soon: °· You may start to feel sick again. °· Your infection may become harder to treat. °· Complications may develop. °WHAT IF I MISS A DOSE? °Try not to miss any doses of medicine. If you miss a dose, take it as soon as possible. However, if it is almost time for the next dose: °· If you are taking 2 doses per day, take the missed dose and the next dose 5 to 6 hours apart. °· If you are taking 3 or more doses per day, take the missed dose and the next dose 2 to 4 hours apart, then go back to the normal schedule. °If you cannot make up a missed dose, take the next scheduled dose on time. Then take the missed dose after you have taken all the doses as recommended by your health care provider, as if you had one more dose left. °DO ANTIBIOTICS AFFECT BIRTH CONTROL? °Birth control pills may not work while you are on antibiotics. If you are taking birth  control pills, continue taking them as usual and use a second form of birth control, such as a condom, to avoid unwanted pregnancy. Continue using the second form of birth control until you are finished with your current 1 month cycle of birth control pills. °OTHER INFORMATION °· If there is any medicine left over, throw it away. °· Never take someone else's antibiotics. °· Never take leftover antibiotics. °SEEK MEDICAL CARE IF: °· You get worse. °· You do not feel better within a few days of starting the antibiotic medicine. °· You vomit. °· White patches appear in your mouth. °· You have new joint pain that begins after starting the antibiotic. °· You have new muscle aches that begin after starting the antibiotic. °· You had a fever before starting the antibiotic and it returns. °· You have any symptoms of an allergic reaction, such as an itchy rash. If this happens, stop taking the antibiotic. °SEEK IMMEDIATE MEDICAL CARE IF: °· Your urine turns dark or becomes blood-colored. °· Your skin turns yellow. °· You bruise or bleed easily. °· You have severe diarrhea and abdominal cramps. °· You have a severe headache. °· You have signs of a severe allergic reaction, such as: °¨ Trouble breathing. °¨ Wheezing. °¨ Swelling of the lips, tongue, or face. °¨ Fainting. °¨ Blisters on the skin or in the mouth. °If you have signs of a severe allergic   reaction, stop taking the antibiotic right away.   This information is not intended to replace advice given to you by your health care provider. Make sure you discuss any questions you have with your health care provider.   Document Released: 06/08/2004 Document Revised: 06/17/2015 Document Reviewed: 02/11/2015 Elsevier Interactive Patient Education [employer]2016 [employer] Inc. Miscarriage A miscarriage is the sudden loss of an unborn baby (fetus) before the 20th week of pregnancy. Most miscarriages happen in the first 3 months of pregnancy. Sometimes, it happens before a woman even  knows she is pregnant. A miscarriage is also called a "spontaneous miscarriage" or "early pregnancy loss." Having a miscarriage can be an emotional experience. Talk with your caregiver about any questions you may have about miscarrying, the grieving process, and your future pregnancy plans. CAUSES   Problems with the fetal chromosomes that make it impossible for the baby to develop normally. Problems with the baby's genes or chromosomes are most often the result of errors that occur, by chance, as the embryo divides and grows. The problems are not inherited from the parents.  Infection of the cervix or uterus.   Hormone problems.   Problems with the cervix, such as having an incompetent cervix. This is when the tissue in the cervix is not strong enough to hold the pregnancy.   Problems with the uterus, such as an abnormally shaped uterus, uterine fibroids, or congenital abnormalities.   Certain medical conditions.   Smoking, drinking alcohol, or taking illegal drugs.   Trauma.  Often, the cause of a miscarriage is unknown.  SYMPTOMS   Vaginal bleeding or spotting, with or without cramps or pain.  Pain or cramping in the abdomen or lower back.  Passing fluid, tissue, or blood clots from the vagina. DIAGNOSIS  Your caregiver will perform a physical exam. You may also have an ultrasound to confirm the miscarriage. Blood or urine tests may also be ordered. TREATMENT   Sometimes, treatment is not necessary if you naturally pass all the fetal tissue that was in the uterus. If some of the fetus or placenta remains in the body (incomplete miscarriage), tissue left behind may become infected and must be removed. Usually, a dilation and curettage (D and C) procedure is performed. During a D and C procedure, the cervix is widened (dilated) and any remaining fetal or placental tissue is gently removed from the uterus.  Antibiotic medicines are prescribed if there is an infection. Other  medicines may be given to reduce the size of the uterus (contract) if there is a lot of bleeding.  If you have Rh negative blood and your baby was Rh positive, you will need a Rh immunoglobulin shot. This shot will protect any future baby from having Rh blood problems in future pregnancies. HOME CARE INSTRUCTIONS   Your caregiver may order bed rest or may allow you to continue light activity. Resume activity as directed by your caregiver.  Have someone help with home and family responsibilities during this time.   Keep track of the number of sanitary pads you use each day and how soaked (saturated) they are. Write down this information.   Do not use tampons. Do not douche or have sexual intercourse until approved by your caregiver.   Only take over-the-counter or prescription medicines for pain or discomfort as directed by your caregiver.   Do not take aspirin. Aspirin can cause bleeding.   Keep all follow-up appointments with your caregiver.   If you or your partner have problems with  grieving, talk to your caregiver or seek counseling to help cope with the pregnancy loss. Allow enough time to grieve before trying to get pregnant again.  SEEK IMMEDIATE MEDICAL CARE IF:   You have severe cramps or pain in your back or abdomen.  You have a fever.  You pass large blood clots (walnut-sized or larger) ortissue from your vagina. Save any tissue for your caregiver to inspect.   Your bleeding increases.   You have a thick, bad-smelling vaginal discharge.  You become lightheaded, weak, or you faint.   You have chills.  MAKE SURE YOU:  Understand these instructions.  Will watch your condition.  Will get help right away if you are not doing well or get worse.   This information is not intended to replace advice given to you by your health care provider. Make sure you discuss any questions you have with your health care provider.   Document Released: 03/22/2001 Document  Revised: 01/21/2013 Document Reviewed: 11/15/2011 Elsevier Interactive Patient Education [employer]2016 Elsevier Inc.

## 2016-04-04 NOTE — MAU Note (Signed)
Pt states she started bleeding @ 0400 yesterday morning, was passing clots, still bleeding now - not quite as heavy.  Had abd pain yesterday, none today.  Pos HPT last Wednesday.

## 2016-04-05 LAB — GC/CHLAMYDIA PROBE AMP (~~LOC~~) NOT AT ARMC
Chlamydia: NEGATIVE
NEISSERIA GONORRHEA: NEGATIVE

## 2016-04-06 ENCOUNTER — Ambulatory Visit: Payer: Medicaid Other | Admitting: Obstetrics & Gynecology

## 2016-04-21 ENCOUNTER — Ambulatory Visit (INDEPENDENT_AMBULATORY_CARE_PROVIDER_SITE_OTHER): Payer: PRIVATE HEALTH INSURANCE | Admitting: Obstetrics and Gynecology

## 2016-04-21 ENCOUNTER — Encounter: Payer: Self-pay | Admitting: Obstetrics and Gynecology

## 2016-04-21 VITALS — BP 109/64 | HR 59 | Wt 171.2 lb

## 2016-04-21 DIAGNOSIS — O039 Complete or unspecified spontaneous abortion without complication: Secondary | ICD-10-CM | POA: Diagnosis not present

## 2016-04-21 NOTE — Addendum Note (Signed)
Addended by: Rockwell GermanyFIELDS, Acey Woodfield A on: 04/21/2016 04:46 PM   Modules accepted: Orders

## 2016-04-21 NOTE — Addendum Note (Signed)
Addended by: Rockwell GermanyFIELDS, Shellye Zandi A on: 04/21/2016 04:34 PM   Modules accepted: Orders

## 2016-04-21 NOTE — Progress Notes (Signed)
Obstetrics and Gynecology Visit MAU Follow up Evaluation  Appointment Date: 04/21/2016  OBGYN Clinic: None  Chief Complaint:  Chief Complaint  Patient presents with  . Miscarriage    History of Present Illness: [name] is a 25 y.o. African-American G1P0010 (Patient's last menstrual period was 03/02/2016.), seen for the above chief complaint. Her past medical history is significant for h/o chlamydia. Went to MAU on 6/26 and had quant of 8 after VB and + home UPT about a week earlier, which was when her period was to be expected. Since then, no VB or spotting or any pain, fevers, chills.  Blood type is Rh pos  Review of Systems:Her 12 point review of systems is negative or as noted in the History of Present Illness.  Past Medical History:  Past Medical History  Diagnosis Date  . Anxiety   . Head injury, closed     dizzy for a few minutes  . Complication of anesthesia   . PONV (postoperative nausea and vomiting)   . Asthma     exercise induced  . Anemia   . History of chlamydia     Past Surgical History:  Past Surgical History  Procedure Laterality Date  . Hand surgery Left 2009    thumb , fracture  . Open reduction internal fixation (orif) proximal phalanx Left 01/06/2016    Procedure: OPEN REDUCTION INTERNAL FIXATION (ORIF) LEFT SMALL FINGER;  Surgeon: Bradly Bienenstock, MD;  [facility];  Service: Orthopedics;  Laterality: Left;  . Hand surgery Left 2006    Pins placed  . Hardware removal Left 02/10/2016    Procedure: LEFT SMALL FINGER DEEP IMPLANT REMOVAL;  Surgeon: Bradly Bienenstock, MD;  Location[facility];  Service: Orthopedics;  Laterality: Left;    Past Obstetrical History:  OB History    Gravida Para Term Preterm AB TAB SAB Ectopic Multiple Living   Obstetric Comments   G1: 03/2016 early SAB (<6wks)      Past Gynecological History: As per HPI. Her periods are regular, qmonth and she wasn't on anything for [facility] when she conceived.   Social  History:  Social History   Social History  . Marital Status: Single    Spouse Name: N/A  . Number of Children: N/A  . Years of Education: N/A   Occupational History  . Not on file.   Social History Main Topics  . Smoking status: Never Smoker   . Smokeless tobacco: Never Used  . Alcohol Use: Yes     Comment: on special ocassions, could be a lot but not always  . Drug Use: No  . Sexual Activity: Yes    Birth Control/ Protection: None   Other Topics Concern  . Not on file   Social History Narrative    Family History: No family history on file.  Medications PNV  Allergies Bee venom and Adhesive   Physical Exam:  BP 109/64 mmHg  Pulse 59  Wt 171 lb 3.2 oz (77.656 kg)  LMP 03/02/2016  Breastfeeding? Unknown Body mass index is 30.33 kg/(m^2). General appearance: Well nourished, well developed female in no acute  Laboratory: as above  Radiology: none  Assessment: likely SAB  Plan:  Repeat quant today to ensure zero since IUP not confirmed. Pt doesn't want to try again but doesn't want to be on [facility]; condoms advised. Pt told to expect period sometime in the 6wks and if not to  let us know  Orders Placed This Encounter  Procedures  . Beta HCG, Quant    RTC PRN  Cornelia Copaharlie Sherrice Creekmore, Jr MD Attending Center for [facility](Faculty Practice)

## 2016-04-22 LAB — HCG, QUANTITATIVE, PREGNANCY: hCG, Beta Chain, Quant, S: <2 m[IU]/mL

## 2016-04-25 ENCOUNTER — Ambulatory Visit: Payer: PRIVATE HEALTH INSURANCE | Attending: Orthopedic Surgery | Admitting: Occupational Therapy

## 2016-04-25 ENCOUNTER — Encounter: Payer: Self-pay | Admitting: Occupational Therapy

## 2016-04-25 DIAGNOSIS — M25542 Pain in joints of left hand: Secondary | ICD-10-CM | POA: Insufficient documentation

## 2016-04-25 DIAGNOSIS — M25642 Stiffness of left hand, not elsewhere classified: Secondary | ICD-10-CM | POA: Insufficient documentation

## 2016-04-25 DIAGNOSIS — M6281 Muscle weakness (generalized): Secondary | ICD-10-CM | POA: Insufficient documentation

## 2016-04-25 DIAGNOSIS — M25641 Stiffness of right hand, not elsewhere classified: Secondary | ICD-10-CM

## 2016-04-25 DIAGNOSIS — M25541 Pain in joints of right hand: Secondary | ICD-10-CM

## 2016-04-25 NOTE — Patient Instructions (Signed)
For Lt hand:   1. Have family member bend middle joint of Lt small finger as much as able, hold 20 sec. while keeping big knuckle straight, then try and hold position as they remove stretch. Return to straightening finger. Repeat 10-15 reps, 6 times per day  2. Squeeze putty with Lt hand 15 reps, 3x/day.    For RT hand:   1. Block big knuckle straight, bend middle joint of small finger, then push further with other hand, then hold new position. Then straighten middle joint all the way. Repeat 15 reps, 6x/day.   2. Squeeze putty with Rt hand (all joints) 15 reps, 3x/day  3. Squeeze putty with Rt hand only with fingers keeping big knuckles straight ("hook" position) x 10 reps, 3x/day. (Start with putty higher in fingers)

## 2016-04-25 NOTE — Therapy (Signed)
[facility] [address] [place], [place], [address] Phone: [phone]   Fax:  [phone]  Occupational Therapy Treatment  Patient Details  Name: [name] MRN: [id number] Date of Birth: 1991-05-16 Referring Provider: Dr Melvyn Novas  Encounter Date: 04/25/2016      OT End of Session - 04/25/16 1417    Visit Number 2   Authorization Type MCD declined: pt no longer has MCD coverage, pt self pay   OT Start Time 1115   OT Stop Time 1215   OT Time Calculation (min) 60 min      Past Medical History  Diagnosis Date  . Anxiety   . Head injury, closed     dizzy for a few minutes  . Complication of anesthesia   . PONV (postoperative nausea and vomiting)   . Asthma     exercise induced  . Anemia   . History of chlamydia     Past Surgical History  Procedure Laterality Date  . Hand surgery Left 2009    thumb , fracture  . Open reduction internal fixation (orif) proximal phalanx Left 01/06/2016    Procedure: OPEN REDUCTION INTERNAL FIXATION (ORIF) LEFT SMALL FINGER;  Surgeon: Bradly Bienenstock, MD;  [facility];  Service: Orthopedics;  Laterality: Left;  . Hand surgery Left 2006    Pins placed  . Hardware removal Left 02/10/2016    Procedure: LEFT SMALL FINGER DEEP IMPLANT REMOVAL;  Surgeon: Bradly Bienenstock, MD;  [facility];  Service: Orthopedics;  Laterality: Left;    There were no vitals filed for this visit.      Subjective Assessment - 04/25/16 1150    Subjective  Pt reports she has also  had dislocation and small fracture to Rt small finger finger (in addition to Lt small finger) since last seen 2 months ago. Pt reports no surgery to Rt small finger and reports completely healed from last xray but still very stiff with joint swelling. Lt small finger also completely healed but no active PIP flexion on Lt side.    Patient Stated Goals Increased ROM and strength left hand.   Currently in Pain? Yes   Pain Score 9     Pain Location Finger (Comment which one)  small   Pain Orientation Right   Pain Descriptors / Indicators Sharp   Pain Onset More than a month ago   Pain Frequency Intermittent   Aggravating Factors  P/ROM to PIP joint   Pain Relieving Factors rest            [facility] OT Assessment - 04/25/16 0001    Right Hand AROM   R Little  MCP 0-90 90 Degrees  ext 0   R Little PIP 0-100 50 Degrees  ext = -15*   R Little DIP 0-70 40 Degrees  ext = 0   Left Hand AROM   L Little  MCP 0-90 90 Degrees  ext 0   L Little PIP 0-100 --  no active flex, passive approx 10*   L Little DIP 0-70 --  rest in -35*, no active ext   Hand Function   Right Hand Grip (lbs) 45 lbs   Left Hand Grip (lbs) 31 lbs                  OT Treatments/Exercises (OP) - 04/25/16 0001    ADLs   ADL Comments Pt returns today after 2 months of not being seen by therapy for Lt small finger.  Pt reports no one called her to schedule appts. Since then, pt has had decline in Lt small PIP joint with no active flexion. Also since then, pt has sustained a dislocation and small fx of Rt small finger affecting PIP motion on Rt side as well. Pt reports MD sent her back to therapy to address this as well, but no new orders rec'd therefore will send re-certification today to MD for updates to include therapy/goals for Rt hand as well. Pt reports no surgery to Rt small finger and fracture has healed on both hands according to last xrays.    Hand Exercises   Other Hand Exercises See pt instructions for details/updates. Pt issued red putty for both hands                OT Education - 04/25/16 1202    Education provided Yes   Education Details updated HEP (Also to include Rt small finger d/t dislocation since last seen)    Person(s) Educated Patient   Methods Explanation;Demonstration;Handout   Comprehension Verbalized understanding;Returned demonstration          OT Short Term Goals - 04/25/16 1418    OT SHORT  TERM GOAL #1   Title Pt will be I HEP   Baseline VC's and TC's required   Time 3   Period Weeks   Status On-going   OT SHORT TERM GOAL #2   Title Pt will be I scar management    Baseline Dependent; Verbal and tactile cues, pt will be educated in this.   Time 3   Period Weeks   Status Achieved   OT SHORT TERM GOAL #3   Title Pt will be Mod I edema management L hand and Rt hand   Baseline vc's and Min A coban use left small finger   Time 3   Period Weeks   Status Revised           OT Long Term Goals - 04/25/16 1418    OT LONG TERM GOAL #1   Title Pt will be independent with updated HEP prn    Status New   OT LONG TERM GOAL #2   Title Pt will improve grip strength Lt hand to 35 lbs or greater   Baseline renewal = 31 lbs (Rt = 45 lbs)    Status New   OT LONG TERM GOAL #3   Title Pt to demo 65 degrees or greater PIP flexion Rt hand small finger in prep for full grasping/gripping    Baseline renewal: 50*   Status New   OT LONG TERM GOAL #4   Title Pt will demo 25 degrees PIP flexion Lt hand small finger in prep for functional gripping    Baseline renewal: no active flexion   OT LONG TERM GOAL #5   Title Pt to verbalize understanding with pain management and edema reduction strategies prn   Status New               Plan - 04/25/16 1421    Clinical Impression Statement Pt returns to O.T. today after 2 months of not being seen (? due to scheduling). Pt was seen today for re-assessment and updated POC and LTG's to include goals for Rt small finger as well d/t closed reduction fracture of Rt small finger since last seen. Pt has had decline in ROM of Lt small finger with no active flexion of PIP joint since last seen.    Rehab Potential Fair   OT  Frequency 1x / week   OT Duration 8 weeks   OT Treatment/Interventions Self-care/ADL training;Therapeutic exercise;Patient/family education;Splinting;Ultrasound;Therapeutic exercises;Therapeutic activities;Fluidtherapy;Scar  mobilization;Passive range of motion;Moist Heat;Parrafin;Manual Therapy;DME and/or AE instruction;Cryotherapy;Electrical Stimulation;Contrast Bath   Plan review HEP, Fluidotherapy bilateral hands, continue with ROM       Patient will benefit from skilled therapeutic intervention in order to improve the following deficits and impairments:  Decreased coordination, Decreased range of motion, Impaired flexibility, Increased edema, Pain, Impaired UE functional use, Decreased scar mobility, Decreased strength  Visit Diagnosis: Stiffness of left hand, not elsewhere classified - Plan: Ot plan of care cert/re-cert  Muscle weakness (generalized) - Plan: Ot plan of care cert/re-cert  Pain in joint of left hand - Plan: Ot plan of care cert/re-cert  Pain in joint of right hand - Plan: Ot plan of care cert/re-cert  Stiffness of right hand, not elsewhere classified - Plan: Ot plan of care cert/re-cert    Problem List There are no active problems to display for this patient.   Kelli Churn, OTR/L 04/25/2016, 2:28 PM  [facility] [address] [place], [place], [address] Phone: [phone]   Fax:  [phone]  Name: [name] MRN: [id number] Date of Birth: 10-12-1990

## 2016-05-10 ENCOUNTER — Ambulatory Visit: Payer: Self-pay | Attending: Orthopedic Surgery | Admitting: Occupational Therapy

## 2016-05-10 DIAGNOSIS — M25541 Pain in joints of right hand: Secondary | ICD-10-CM | POA: Insufficient documentation

## 2016-05-10 DIAGNOSIS — M25641 Stiffness of right hand, not elsewhere classified: Secondary | ICD-10-CM | POA: Insufficient documentation

## 2016-05-10 DIAGNOSIS — M25542 Pain in joints of left hand: Secondary | ICD-10-CM | POA: Insufficient documentation

## 2016-05-10 DIAGNOSIS — M25642 Stiffness of left hand, not elsewhere classified: Secondary | ICD-10-CM | POA: Insufficient documentation

## 2016-05-10 DIAGNOSIS — M6281 Muscle weakness (generalized): Secondary | ICD-10-CM | POA: Insufficient documentation

## 2016-05-10 NOTE — Therapy (Signed)
[facility] [address] [place], [place], [address] Phone: [phone]   Fax:  [phone]  Occupational Therapy Treatment  Patient Details  Name: [name] MRN: [id number] Date of Birth: December 11, 1990 Referring Provider: Dr Caralyn Guile  Encounter Date: 05/10/2016      OT End of Session - 05/10/16 1104    Visit Number 3   Authorization Type MCD declined: pt no longer has MCD coverage, pt self pay   OT Start Time 1020   OT Stop Time 1100   OT Time Calculation (min) 40 min   Activity Tolerance Patient tolerated treatment well      Past Medical History:  Diagnosis Date  . Anemia   . Anxiety   . Asthma    exercise induced  . Complication of anesthesia   . Head injury, closed    dizzy for a few minutes  . History of chlamydia   . PONV (postoperative nausea and vomiting)     Past Surgical History:  Procedure Laterality Date  . HAND SURGERY Left 2009   thumb , fracture  . HAND SURGERY Left 2006   Pins placed  . HARDWARE REMOVAL Left 02/10/2016   Procedure: LEFT SMALL FINGER DEEP IMPLANT REMOVAL;  Surgeon: Iran Planas, MD;  Location: [place];  Service: Orthopedics;  Laterality: Left;  . OPEN REDUCTION INTERNAL FIXATION (ORIF) PROXIMAL PHALANX Left 01/06/2016   Procedure: OPEN REDUCTION INTERNAL FIXATION (ORIF) LEFT SMALL FINGER;  Surgeon: Iran Planas, MD;  Location: [place];  Service: Orthopedics;  Laterality: Left;    There were no vitals filed for this visit.      Subjective Assessment - 05/10/16 1021    Subjective  My right hand seems a little better   Patient Stated Goals Increased ROM and strength left hand, and now right hand   Currently in Pain? Yes   Pain Score 4    Pain Location --  SMALL FINGER   Pain Orientation Right   Pain Descriptors / Indicators Dull   Pain Onset More than a month ago   Pain Frequency Intermittent   Aggravating Factors  movement   Pain Relieving Factors rest             [facility] OT Assessment - 05/10/16 0001      Right Hand AROM   R Little PIP 0-100 70 Degrees  ext = -12*     Hand Function   Right Hand Grip (lbs) 45 lbs   Left Hand Grip (lbs) 35 lbs                  OT Treatments/Exercises (OP) - 05/10/16 0001      Exercises   Exercises Hand     Hand Exercises   Other Hand Exercises Reviewed previously issued HEP   Other Hand Exercises Pt shown pen rolling ex (using highlighter) for Rt small PIP motion - pt return demo. Pt also performing gripper activity Rt hand at 35 lbs resistance for sustained grip strength with no difficulty, and Lt hand at same resistance with min difficulty.      Modalities   Modalities Fluidotherapy     RUE Fluidotherapy   Number Minutes Fluidotherapy 10 Minutes   RUE Fluidotherapy Location Hand;Wrist   Comments to decr. stiffness/pain     LUE Fluidotherapy   Comments Did not place Lt hand in fluidotherapy today d/t small cut and open area at tip of small finger, but will in future sessions once closed  Manual Therapy   Manual Therapy Passive ROM   Passive ROM Therapist performed P/ROM to Rt small PIP joint in flex and ext, followed by place and hold ex's                  OT Short Term Goals - 05/10/16 1105      OT SHORT TERM GOAL #1   Title Pt will be I HEP   Baseline VC's and TC's required   Time 3   Period Weeks   Status Achieved     OT SHORT TERM GOAL #2   Title Pt will be I scar management    Baseline Dependent; Verbal and tactile cues, pt will be educated in this.   Time 3   Period Weeks   Status Achieved     OT SHORT TERM GOAL #3   Title Pt will be Mod I edema management L hand and Rt hand   Baseline vc's and Min A coban use left small finger   Time 3   Period Weeks   Status Achieved           OT Long Term Goals - 05/10/16 1105      OT LONG TERM GOAL #1   Title Pt will be independent with updated HEP prn    Status On-going     OT LONG TERM GOAL #2   Title Pt  will improve grip strength Lt hand to 35 lbs or greater   Baseline renewal = 31 lbs (Rt = 45 lbs)    Status Achieved  05/10/16: 35 lbs     OT LONG TERM GOAL #3   Title Pt to demo 85 degrees or greater PIP flexion Rt hand small finger in prep for full grasping/gripping    Baseline renewal: 50*, 05/10/16: 70*   Status Revised     OT LONG TERM GOAL #4   Title Pt will demo 25 degrees PIP flexion Lt hand small finger in prep for functional gripping    Baseline renewal: no active flexion   Status On-going     OT LONG TERM GOAL #5   Title Pt to verbalize understanding with pain management and edema reduction strategies prn   Status On-going               Plan - 05/10/16 1106    Clinical Impression Statement Pt met all STG's. Pt has improved Lt grip strength and met LTG #2, and increased PIP motion of Rt small finger, therefore updated/revised LTG #3.    Rehab Potential Fair   OT Frequency 1x / week   OT Duration 8 weeks   OT Treatment/Interventions Self-care/ADL training;Therapeutic exercise;Patient/family education;Splinting;Ultrasound;Therapeutic exercises;Therapeutic activities;Fluidtherapy;Scar mobilization;Passive range of motion;Moist Heat;Parrafin;Manual Therapy;DME and/or AE instruction;Cryotherapy;Electrical Stimulation;Contrast Bath   Plan Continue fluidotherapy, ROM and strength bilateral hands as able   Consulted and Agree with Plan of Care Patient      Patient will benefit from skilled therapeutic intervention in order to improve the following deficits and impairments:  Decreased coordination, Decreased range of motion, Impaired flexibility, Increased edema, Pain, Impaired UE functional use, Decreased scar mobility, Decreased strength  Visit Diagnosis: Stiffness of left hand, not elsewhere classified  Muscle weakness (generalized)  Pain in joint of left hand  Pain in joint of right hand  Stiffness of right hand, not elsewhere classified    Problem List There  are no active problems to display for this patient.   Carey Bullocks, OTR/L 05/10/2016, 11:09 AM  [place]  [facility] [address] [place], [place], [address] Phone: [phone]   Fax:  [phone]  Name: [name] MRN: [id number] Date of Birth: 12/26/1990

## 2016-05-26 ENCOUNTER — Ambulatory Visit: Payer: Self-pay | Admitting: Occupational Therapy

## 2016-05-28 ENCOUNTER — Encounter (HOSPITAL_COMMUNITY): Payer: Self-pay

## 2016-05-28 ENCOUNTER — Emergency Department (HOSPITAL_COMMUNITY)
Admission: EM | Admit: 2016-05-28 | Discharge: 2016-05-28 | Disposition: A | Discharge status: Home / Self Care | Payer: PRIVATE HEALTH INSURANCE | Attending: Emergency Medicine | Admitting: Emergency Medicine

## 2016-05-28 DIAGNOSIS — J45909 Unspecified asthma, uncomplicated: Secondary | ICD-10-CM | POA: Insufficient documentation

## 2016-05-28 DIAGNOSIS — X58XXXA Exposure to other specified factors, initial encounter: Secondary | ICD-10-CM | POA: Insufficient documentation

## 2016-05-28 DIAGNOSIS — Y929 Unspecified place or not applicable: Secondary | ICD-10-CM | POA: Insufficient documentation

## 2016-05-28 DIAGNOSIS — Y999 Unspecified external cause status: Secondary | ICD-10-CM | POA: Insufficient documentation

## 2016-05-28 DIAGNOSIS — Y939 Activity, unspecified: Secondary | ICD-10-CM | POA: Insufficient documentation

## 2016-05-28 DIAGNOSIS — Z79899 Other long term (current) drug therapy: Secondary | ICD-10-CM | POA: Insufficient documentation

## 2016-05-28 DIAGNOSIS — T63444A Toxic effect of venom of bees, undetermined, initial encounter: Secondary | ICD-10-CM

## 2016-05-28 MED ORDER — METHYLPREDNISOLONE SODIUM SUCC 125 MG IJ SOLR
125.0000 mg | Freq: Once | INTRAMUSCULAR | Status: AC
  Administered 2016-05-28: 125 mg via INTRAVENOUS
  Filled 2016-05-28: qty 2

## 2016-05-28 MED ORDER — EPINEPHRINE 0.3 MG/0.3ML IJ SOAJ: 0.3000 mg | Freq: Once | INTRAMUSCULAR | 0 refills | Status: AC

## 2016-05-28 MED ORDER — FAMOTIDINE IN NACL 20-0.9 MG/50ML-% IV SOLN
20.0000 mg | Freq: Once | INTRAVENOUS | Status: AC
  Administered 2016-05-28: 20 mg via INTRAVENOUS
  Filled 2016-05-28: qty 50

## 2016-05-28 MED ORDER — DIPHENHYDRAMINE HCL 50 MG/ML IJ SOLN
25.0000 mg | Freq: Once | INTRAMUSCULAR | Status: AC
  Administered 2016-05-28: 25 mg via INTRAVENOUS
  Filled 2016-05-28: qty 1

## 2016-05-28 NOTE — ED Notes (Signed)
Patient verbalized understanding of discharge instructions and denies any further needs or questions at this time. VS stable. Patient ambulatory with steady gait.  

## 2016-05-28 NOTE — Discharge Instructions (Signed)
Carry epipen with you at all times. [facility]. Return to the ED if you experience severe worsening of your symptoms, difficulty breathing, lip or tongue swelling. Take benadryl at home as needed for itch.

## 2016-05-28 NOTE — ED Triage Notes (Signed)
Onset 11am bee sting to left posterior shoulder.  No hives, difficulty breathing or swallowing, throat, lip or tongue swelling.  Pt reports with bee stings, an hour later will get anaphylaxis.

## 2016-05-28 NOTE — ED Provider Notes (Signed)
[facility] DEPT Provider Note   CSN: [id number] Arrival date & time: 05/28/16  1110     History   Chief Complaint Chief Complaint  Patient presents with  . Insect Bite    HPI [name] is a 25 y.o. female who presents the ED today complaining of a bee sting. Patient states that she was sitting outside on 11 AM and was stung by either a yellowjacket or a wasp on her left shoulder blade. 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 She states the last time she was stung by a bee her throat closed up. However, she has not carried EpiPen in the last 3-4 years. While in the waiting room of the emergency department she states that her throat started to feel scratchy and tingly. She denies any difficulty breathing, lip or tongue swelling, rash. Patient has not taken any medications prior to arrival.  HPI  Past Medical History:  Diagnosis Date  . Anemia   . Anxiety   . Asthma    exercise induced  . Complication of anesthesia   . Head injury, closed    dizzy for a few minutes  . History of chlamydia   . PONV (postoperative nausea and vomiting)     There are no active problems to display for this patient.   Past Surgical History:  Procedure Laterality Date  . HAND SURGERY Left 2009   thumb , fracture  . HAND SURGERY Left 2006   Pins placed  . HARDWARE REMOVAL Left 02/10/2016   Procedure: LEFT SMALL FINGER DEEP IMPLANT REMOVAL;  Surgeon: Bradly BienenstockFred Ortmann, MD;  [facility];  Service: Orthopedics;  Laterality: Left;  . OPEN REDUCTION INTERNAL FIXATION (ORIF) PROXIMAL PHALANX Left 01/06/2016   Procedure: OPEN REDUCTION INTERNAL FIXATION (ORIF) LEFT SMALL FINGER;  Surgeon: Bradly BienenstockFred Ortmann, MD;  [facility];  Service: Orthopedics;  Laterality: Left;    OB History    Gravida Para Term Preterm AB Living   1       1     SAB TAB Ectopic Multiple Live Births   1              Obstetric Comments   G1: 03/2016 early SAB (<6wks)       Home  Medications    Prior to Admission medications   Medication Sig Start Date End Date Taking? Authorizing Provider  albuterol (PROVENTIL HFA;VENTOLIN HFA) 108 (90 Base) MCG/ACT inhaler Inhale 2 puffs into the lungs every 4 (four) hours as needed for wheezing or shortness of breath. 12/06/15  Yes Charm RingsErin J Honig, MD  Multiple Vitamin (MULTIVITAMIN WITH MINERALS) TABS tablet Take 1 tablet by mouth daily.   Yes Historical Provider, MD  naproxen sodium (ALEVE) 220 MG tablet Take 440 mg by mouth daily as needed (headache/ pain).   Yes Historical Provider, MD  ibuprofen (ADVIL,MOTRIN) 600 MG tablet Take 1 tablet (600 mg total) by mouth every 6 (six) hours as needed. Patient not taking: Reported on 04/21/2016 04/04/16   Duane LopeJennifer I Rasch, NP    Family History History reviewed. No pertinent family history.  Social History Social History  Substance Use Topics  . Smoking status: Never Smoker  . Smokeless tobacco: Never Used  . Alcohol use Yes     Comment: on special ocassions, could be a lot but not always     Allergies   Bee venom and Adhesive [tape]   Review of Systems Review of Systems  All other systems reviewed and  are negative.    Physical Exam Updated Vital Signs BP 107/60 (BP Location: Right Arm)   Pulse 67   Temp 98.3 F (36.8 C) (Oral)   Resp 14   Ht 5\' 3"  (1.6 m)   Wt 77.6 kg   LMP 05/22/2016   SpO2 99%   Breastfeeding? No   BMI 30.29 kg/m   Physical Exam  Constitutional: She is oriented to person, place, and time. She appears well-developed and well-nourished. No distress.  HENT:  Head: Normocephalic and atraumatic.  Mouth/Throat: No oropharyngeal exudate.  No lip or tongue swelling  Eyes: Conjunctivae and EOM are normal. Pupils are equal, round, and reactive to light. Right eye exhibits no discharge. Left eye exhibits no discharge. No scleral icterus.  Cardiovascular: Normal rate, regular rhythm, normal heart sounds and intact distal pulses.  Exam reveals no gallop  and no friction rub.   No murmur heard. Pulmonary/Chest: Effort normal and breath sounds normal. No respiratory distress. She has no wheezes. She has no rales. She exhibits no tenderness.  Abdominal: Soft. She exhibits no distension. There is no tenderness. There is no guarding.  Musculoskeletal: Normal range of motion. She exhibits no edema.  Neurological: She is alert and oriented to person, place, and time.  Skin: Skin is warm and dry. No rash noted. She is not diaphoretic. No erythema. No pallor.  Small area of raised macule on left posterior shoulder  Psychiatric: She has a normal mood and affect. Her behavior is normal.  Nursing note and vitals reviewed.    ED Treatments / Results  Labs (all labs ordered are listed, but only abnormal results are displayed) Labs Reviewed - No data to display  EKG  EKG Interpretation None       Radiology No results found.  Procedures Procedures (including critical care time)  Medications Ordered in ED Medications  famotidine (PEPCID) IVPB 20 mg premix (0 mg Intravenous Stopped 05/28/16 1313)  methylPREDNISolone sodium succinate (SOLU-MEDROL) 125 mg/2 mL injection 125 mg (125 mg Intravenous Given 05/28/16 1245)  diphenhydrAMINE (BENADRYL) injection 25 mg (25 mg Intravenous Given 05/28/16 1247)     Initial Impression / Assessment and Plan / ED Course  I have reviewed the triage vital signs and the nursing notes.  Pertinent labs & imaging results that were available during my care of the patient were reviewed by me and considered in my medical decision making (see chart for details).  Clinical Course   25 y.o F presents to the Ed after an insect sting, unsure what kind of bug stung her. Pt has hx of anaphylaxis to bee stings.Pt appears well in ED, no respiratory distress. No lip or tongue swelling. Pt is 100% O2 on RA. Due to hx pt was given IV solumedrol, benadryl and pepcid. Pt was monitored in the ED for 3 hours without complication or  advancement of symptoms. Patient re-evaluated prior to dc, is hemodynamically stable, in no respiratory distress, and denies the feeling of throat closing. Pt has been advised to take OTC benadryl & return to the ED if they have a mod-severe allergic rxn (s/s including throat closing, difficulty breathing, swelling of lips face or tongue). Pt is to follow up with their PCP. Pt is agreeable with plan & verbalizes understanding. Will d/c with rx for epipen as pt states she is out.   Final Clinical Impressions(s) / ED Diagnoses   Final diagnoses:  Bee sting, undetermined intent, initial encounter    New Prescriptions Discharge Medication List as of 05/28/2016  3:24 PM    START taking these medications   Details  EPINEPHrine 0.3 mg/0.3 mL IJ SOAJ injection Inject 0.3 mLs (0.3 mg total) into the muscle once., Starting Sat 05/28/2016, Print         Lester Kinsman [place], PA-C 05/28/16 1716    Raeford Razor, MD 05/29/16 1443

## 2016-05-28 NOTE — ED Notes (Signed)
Patient came to desk stating "I feel like my throat is closing up". Patient able to speak full sentences without difficulty, no swelling noted, patient placed in front of nurses desk for observation.

## 2016-05-28 NOTE — ED Notes (Signed)
Placed IV in right hand.  No S/S of anaphylaxis, no redness or swelling around where pt was stung on her left shoulder blade.  No hives and no s/s of allergic reaction.  Small sting area visible, no stinger in placed, was there when boyfriend checked after the sting.  Pt assumes it was a bee which she is allergic to, no bee was seen

## 2016-06-07 ENCOUNTER — Ambulatory Visit: Payer: Self-pay | Admitting: Occupational Therapy

## 2016-06-07 DIAGNOSIS — M25541 Pain in joints of right hand: Secondary | ICD-10-CM

## 2016-06-07 DIAGNOSIS — M6281 Muscle weakness (generalized): Secondary | ICD-10-CM

## 2016-06-07 DIAGNOSIS — M25641 Stiffness of right hand, not elsewhere classified: Secondary | ICD-10-CM

## 2016-06-07 DIAGNOSIS — M25642 Stiffness of left hand, not elsewhere classified: Secondary | ICD-10-CM

## 2016-06-07 NOTE — Therapy (Signed)
[facility] [address] [place], [place], [address] Phone: [phone]   Fax:  [phone]  Occupational Therapy Treatment  Patient Details  Name: [name] MRN: [id number] Date of Birth: [birth year] Referring Provider: Dr Melvyn Novas  Encounter Date: 06/07/2016      OT End of Session - 06/07/16 1307    Visit Number 4   Number of Visits 8   Authorization Type MCD declined: pt no longer has MCD coverage, pt self pay   OT Start Time 1015   OT Stop Time 1100   OT Time Calculation (min) 45 min   Activity Tolerance Patient tolerated treatment well      Past Medical History:  Diagnosis Date  . Anemia   . Anxiety   . Asthma    exercise induced  . Complication of anesthesia   . Head injury, closed    dizzy for a few minutes  . History of chlamydia   . PONV (postoperative nausea and vomiting)     Past Surgical History:  Procedure Laterality Date  . HAND SURGERY Left 2009   thumb , fracture  . HAND SURGERY Left 2006   Pins placed  . HARDWARE REMOVAL Left 02/10/2016   Procedure: LEFT SMALL FINGER DEEP IMPLANT REMOVAL;  Surgeon: Bradly Bienenstock, MD;  [facility];  Service: Orthopedics;  Laterality: Left;  . OPEN REDUCTION INTERNAL FIXATION (ORIF) PROXIMAL PHALANX Left 01/06/2016   Procedure: OPEN REDUCTION INTERNAL FIXATION (ORIF) LEFT SMALL FINGER;  Surgeon: Bradly Bienenstock, MD;  [facility];  Service: Orthopedics;  Laterality: Left;    There were no vitals filed for this visit.      Subjective Assessment - 06/07/16 1027    Subjective  My left finger really doesn't hurt b/c I can't bend it.    Patient Stated Goals Increased ROM and strength left hand, and now right hand   Currently in Pain? Yes   Pain Score 4   Worse with movement   Pain Location Finger (Comment which one)  small   Pain Orientation Right   Pain Descriptors / Indicators Dull   Pain Onset More than a month ago   Pain Frequency Intermittent    Aggravating Factors  movement   Pain Relieving Factors rest, heat            [facility] OT Assessment - 06/07/16 0001      Right Hand AROM   R Little PIP 0-100 70 Degrees  ext = -8     Hand Function   Right Hand Grip (lbs) 45 lbs   Left Hand Grip (lbs) 45 lbs                  OT Treatments/Exercises (OP) - 06/07/16 0001      ADLs   ADL Comments Stressed the importance of doing ex's daily and more regular attendance with therapy in order to improve. Pt has not improved in Rt small finger PIP flexion and reports not doing certain ex's or aggressively stretching in P/ROM. Pt's pain level went up to 8/10 when therapist passively flexed Rt small PIP joint. Pt advised to call MD re: pain and f/u with him. Also advised pt to call financial aid - pt reports she has not filled out financial aid form previously given     Hand Exercises   Other Hand Exercises Reviewed previously issued HEP's. Pt reports not doing pen rolling ex with highlighter. Therapist reviewed and pt return demo. Also reviewed putty IP  flexion exercise   Other Hand Exercises Gripper set at level 2 for sustained grip strength Rt hand with min difficulty     RUE Fluidotherapy   Number Minutes Fluidotherapy 12 Minutes   RUE Fluidotherapy Location Hand;Wrist   Comments to decr. stiffness/pain     LUE Fluidotherapy   Number Minutes Fluidotherapy 12 Minutes  simultaneously with Rt   LUE Fluidotherapy Location Hand;Wrist   Comments to decr. stiffness     Manual Therapy   Passive ROM Therapist performed P/ROM to Rt small PIP joint in flex and ext, followed by place and hold ex's                  OT Short Term Goals - 05/10/16 1105      OT SHORT TERM GOAL #1   Title Pt will be I HEP   Baseline VC's and TC's required   Time 3   Period Weeks   Status Achieved     OT SHORT TERM GOAL #2   Title Pt will be I scar management    Baseline Dependent; Verbal and tactile cues, pt will be educated in  this.   Time 3   Period Weeks   Status Achieved     OT SHORT TERM GOAL #3   Title Pt will be Mod I edema management L hand and Rt hand   Baseline vc's and Min A coban use left small finger   Time 3   Period Weeks   Status Achieved           OT Long Term Goals - 06/07/16 1308      OT LONG TERM GOAL #1   Title Pt will be independent with updated HEP prn    Status On-going     OT LONG TERM GOAL #2   Title Pt will improve grip strength Lt hand to 35 lbs or greater   Baseline renewal = 31 lbs (Rt = 45 lbs)    Status Achieved  06/07/16: 45 lbs     OT LONG TERM GOAL #3   Title Pt to demo 85 degrees or greater PIP flexion Rt hand small finger in prep for full grasping/gripping    Baseline renewal: 50*, 05/10/16: 70*   Status Revised     OT LONG TERM GOAL #4   Title Pt will demo 25 degrees PIP flexion Lt hand small finger in prep for functional gripping    Baseline renewal: no active flexion   Status On-going     OT LONG TERM GOAL #5   Title Pt to verbalize understanding with pain management and edema reduction strategies prn   Status On-going               Plan - 06/07/16 1309    Clinical Impression Statement Pt remains limited in Rt hand small finger ROM and strength d/t poor attendance and carryover of exercises at home.    Rehab Potential Fair   OT Frequency 1x / week   OT Duration 8 weeks   OT Treatment/Interventions Self-care/ADL training;Therapeutic exercise;Patient/family education;Splinting;Ultrasound;Therapeutic exercises;Therapeutic activities;Fluidtherapy;Scar mobilization;Passive range of motion;Moist Heat;Parrafin;Manual Therapy;DME and/or AE instruction;Cryotherapy;Electrical Stimulation;Contrast Bath   Plan continue fluido, ROM and strength bilateral hands, gripper activity for Lt hand. Pt agrees to come 1x/wk for 4 weeks. Pt told therapist would d/c if not seen on a more regular basis (every other week minimal) - pt agrees   Consulted and Agree with  Plan of Care Patient      Patient will  benefit from skilled therapeutic intervention in order to improve the following deficits and impairments:  Decreased coordination, Decreased range of motion, Impaired flexibility, Increased edema, Pain, Impaired UE functional use, Decreased scar mobility, Decreased strength  Visit Diagnosis: Stiffness of left hand, not elsewhere classified  Muscle weakness (generalized)  Pain in joint of right hand  Stiffness of right hand, not elsewhere classified    Problem List There are no active problems to display for this patient.   Kelli Churn, OTR/L 06/07/2016, 1:12 PM  [facility] [address] [place], [place], [address] Phone: [phone]   Fax:  [phone]  Name: Danamarie Kuechler MRN: [id number] Date of Birth: Mar 15, 1991

## 2016-07-05 ENCOUNTER — Ambulatory Visit: Payer: Medicaid Other | Attending: Orthopedic Surgery | Admitting: Occupational Therapy

## 2016-07-12 ENCOUNTER — Ambulatory Visit: Payer: Medicaid Other | Attending: Orthopedic Surgery | Admitting: Occupational Therapy

## 2016-07-12 ENCOUNTER — Encounter: Payer: Self-pay | Admitting: Occupational Therapy

## 2016-07-12 NOTE — Therapy (Signed)
[facility] [address] [place], [place], [address] Phone: [phone]   Fax:  [phone]  Patient Details  Name: [name] MRN: [id number] Date of Birth: 1991/06/21 Referring Provider:  Dr. Caralyn Guile Encounter Date: 07/12/2016   OCCUPATIONAL THERAPY DISCHARGE SUMMARY  Visits from Start of Care: 4      OT Short Term Goals - 05/10/16 1105      OT SHORT TERM GOAL #1   Title Pt will be I HEP   Baseline VC's and TC's required   Time 3   Period Weeks   Status Achieved     OT SHORT TERM GOAL #2   Title Pt will be I scar management    Baseline Dependent; Verbal and tactile cues, pt will be educated in this.   Time 3   Period Weeks   Status Achieved     OT SHORT TERM GOAL #3   Title Pt will be Mod I edema management L hand and Rt hand   Baseline vc's and Min A coban use left small finger   Time 3   Period Weeks   Status Achieved       Current functional level related to goals / functional outcomes:     OT Long Term Goals - 06/07/16 1308      OT LONG TERM GOAL #1   Title Pt will be independent with updated HEP prn    Status Issued, but pt was not doing consistently     OT LONG TERM GOAL #2   Title Pt will improve grip strength Lt hand to 35 lbs or greater   Baseline renewal = 31 lbs (Rt = 45 lbs)    Status Achieved  06/07/16: 45 lbs     OT LONG TERM GOAL #3   Title Pt to demo 85 degrees or greater PIP flexion Rt hand small finger in prep for full grasping/gripping    Baseline renewal: 50*, 05/10/16: 70*   Status Not met     OT LONG TERM GOAL #4   Title Pt will demo 25 degrees PIP flexion Lt hand small finger in prep for functional gripping    Baseline renewal: no active flexion   Status Not met     OT LONG TERM GOAL #5   Title Pt to verbalize understanding with pain management and edema reduction strategies prn   Status Met         Remaining deficits: ROM Strength Pain   Education /  Equipment: Pt provided with HEP's, pain management and edema management strategies, scar massage  Plan: Patient agrees to discharge.  Patient goals were partially met. Patient is being discharged due to not returning since the last visit.  Pt has had inconsistent attendance and no shows. ????         Carey Bullocks, OTR/L 07/12/2016, 12:30 PM  [facility] [address] [place], [place], [address] Phone: [phone]   Fax:  [phone]

## 2016-07-19 ENCOUNTER — Encounter: Payer: PRIVATE HEALTH INSURANCE | Admitting: Occupational Therapy

## 2016-07-26 ENCOUNTER — Encounter: Payer: PRIVATE HEALTH INSURANCE | Admitting: Occupational Therapy

## 2016-08-05 ENCOUNTER — Ambulatory Visit: Payer: PRIVATE HEALTH INSURANCE | Admitting: Obstetrics & Gynecology

## 2016-09-19 ENCOUNTER — Ambulatory Visit (INDEPENDENT_AMBULATORY_CARE_PROVIDER_SITE_OTHER): Payer: Medicaid Other | Admitting: Obstetrics & Gynecology

## 2016-09-19 ENCOUNTER — Encounter: Payer: Self-pay | Admitting: Obstetrics & Gynecology

## 2016-09-19 VITALS — BP 116/64 | HR 73 | Ht 63.0 in | Wt 180.0 lb

## 2016-09-19 DIAGNOSIS — Z308 Encounter for other contraceptive management: Secondary | ICD-10-CM | POA: Diagnosis not present

## 2016-09-19 DIAGNOSIS — Z01419 Encounter for gynecological examination (general) (routine) without abnormal findings: Secondary | ICD-10-CM | POA: Diagnosis not present

## 2016-09-19 NOTE — Progress Notes (Addendum)
GYNECOLOGY ANNUAL PREVENTATIVE CARE ENCOUNTER NOTE  Subjective:   [name] is a 25 y.o. G61P0010 female here for a routine annual gynecologic exam.  Current complaints: concerned because her grandmother was recently diagnosed with breast cancer and she is very concerned. She wants a mammogram.   Denies abnormal vaginal bleeding, discharge, pelvic pain, problems with intercourse or other gynecologic concerns.    Gynecologic History Patient's last menstrual period was 08/29/2016. Contraception: none, declines any modality. Last Pap: 2 years ago. Results were: normal   Obstetric History OB History  Gravida Para Term Preterm AB Living  1       1    SAB TAB Ectopic Multiple Live Births  1            # Outcome Date GA Lbr Len/2nd Weight Sex Delivery Anes PTL Lv  1 SAB             Obstetric Comments  G1: 03/2016 early SAB (<6wks)    Past Medical History:  Diagnosis Date  . Anemia   . Anxiety   . Asthma    exercise induced  . Complication of anesthesia   . Head injury, closed    dizzy for a few minutes  . History of chlamydia   . PONV (postoperative nausea and vomiting)     Past Surgical History:  Procedure Laterality Date  . HAND SURGERY Left 2009   thumb , fracture  . HAND SURGERY Left 2006   Pins placed  . HARDWARE REMOVAL Left 02/10/2016   Procedure: LEFT SMALL FINGER DEEP IMPLANT REMOVAL;  Surgeon: Iran Planas, MD;  Location: [facility];  Service: Orthopedics;  Laterality: Left;  . OPEN REDUCTION INTERNAL FIXATION (ORIF) PROXIMAL PHALANX Left 01/06/2016   Procedure: OPEN REDUCTION INTERNAL FIXATION (ORIF) LEFT SMALL FINGER;  Surgeon: Iran Planas, MD;  Location: [facility];  Service: Orthopedics;  Laterality: Left;    Current Outpatient Prescriptions on File Prior to Visit  Medication Sig Dispense Refill  . albuterol (PROVENTIL HFA;VENTOLIN HFA) 108 (90 Base) MCG/ACT inhaler Inhale 2 puffs into the lungs every 4 (four) hours as needed for wheezing or shortness of breath.  (Patient not taking: Reported on 09/19/2016) 1 Inhaler 2  . ibuprofen (ADVIL,MOTRIN) 600 MG tablet Take 1 tablet (600 mg total) by mouth every 6 (six) hours as needed. (Patient not taking: Reported on 09/19/2016) 30 tablet 0  . Multiple Vitamin (MULTIVITAMIN WITH MINERALS) TABS tablet Take 1 tablet by mouth daily.    . naproxen sodium (ALEVE) 220 MG tablet Take 440 mg by mouth daily as needed (headache/ pain).     No current facility-administered medications on file prior to visit.     Allergies  Allergen Reactions  . Bee Venom Anaphylaxis  . Adhesive [Tape] Itching and Rash    Social History   Social History  . Marital status: Single    Spouse name: N/A  . Number of children: N/A  . Years of education: N/A   Occupational History  . Not on file.   Social History Main Topics  . Smoking status: Never Smoker  . Smokeless tobacco: Never Used  . Alcohol use Yes     Comment: on special ocassions, could be a lot but not always  . Drug use: No  . Sexual activity: Yes    Birth control/ protection: None   Other Topics Concern  . Not on file   Social History Narrative  . No narrative on file    History reviewed. No  pertinent family history.  The following portions of the patient's history were reviewed and updated as appropriate: allergies, current medications, past family history, past medical history, past social history, past surgical history and problem list.  Review of Systems Pertinent items noted in HPI and remainder of comprehensive ROS otherwise negative.   Objective:  BP 116/64   Pulse 73   Ht 5' 3" (1.6 m)   Wt 180 lb (81.6 kg)   LMP 08/29/2016   BMI 31.89 kg/m  CONSTITUTIONAL: Well-developed, well-nourished female in no acute distress.  HENT:  Normocephalic, atraumatic, External right and left ear normal. Oropharynx is clear and moist EYES: Conjunctivae and EOM are normal. Pupils are equal, round, and reactive to light. No scleral icterus.  NECK: Normal  range of motion, supple, no masses.  Normal thyroid.  SKIN: Skin is warm and dry. No rash noted. Not diaphoretic. No erythema. No pallor. NEUROLOGIC: Alert and oriented to person, place, and time. Normal reflexes, muscle tone coordination. No cranial nerve deficit noted. PSYCHIATRIC: Normal mood and affect. Normal behavior. Normal judgment and thought content. CARDIOVASCULAR: Normal heart rate noted, regular rhythm RESPIRATORY: Clear to auscultation bilaterally. Effort and breath sounds normal, no problems with respiration noted. BREASTS: Symmetric in size. No masses, skin changes, nipple drainage, or lymphadenopathy. ABDOMEN: Soft, normal bowel sounds, no distention noted.  No tenderness, rebound or guarding.  PELVIC: Normal appearing external genitalia; normal appearing vaginal mucosa and cervix.  No abnormal discharge noted.  Pap smear obtained.  Normal uterine size, no other palpable masses, no uterine or adnexal tenderness. MUSCULOSKELETAL: Normal range of motion. No tenderness.  No cyanosis, clubbing, or edema.  2+ distal pulses.   Assessment:  Annual gynecologic examination with pap smear FH breast cancer in grandmother   Plan:  Will follow up results of pap smear and manage accordingly. Counseled about risk of breast cancer given GM's diagnosis, referred to [email or web address] for further information about BRCA testing if she desires.   No need for any breast imaging for now. Told to continue multivitamins + folate daily as she can conceive at any time given her declining contraception. Routine preventative health maintenance measures emphasized. Patient will return later for fasting preventative health labs: Lipid panel, CBC, TSH, HgA1C. Please refer to After Visit Summary for other counseling recommendations.    Verita Schneiders, MD, [place] Attending Obstetrician & Gynecologist, [place] for [facility]

## 2016-09-19 NOTE — Patient Instructions (Addendum)
[email or web address]   BRCA Gene Testing Why am I having this test? BRCA gene testing is done to check for the presence of harmful changes (mutations) in the BRCA1 gene or the BRCA2 gene (breast cancer susceptibility genes). If there is a mutation, the genes may not be able to help repair damaged cells in the body. As a result, the damaged cells may develop defects that can lead to certain types of cancer. You may have this test if you have a family history of certain types of cancer, including cancer of the:  Breast.  Ovaries.  Fallopian tubes.  Peritoneum.  Pancreas.  Prostate. What kind of sample is taken? The test requires either a sample of blood or a sample of cells from your saliva. If a sample of blood is needed, it will probably be collected by inserting a needle into a vein. If a sample of saliva is needed, you will get instructions about how to collect the sample. What do the results mean? The test results can show whether you have a mutation in the BRCA1 or BRCA2 gene that increases your risk for certain cancers. Meaning of negative test results A negative test result means that you do not have a mutation in the BRCA1 or BRCA2 gene that is known to increase your risk for certain cancers. This does not mean that you will never get cancer. Talk with your health care provider or a genetic counselor about what this result means for you. Meaning of positive test results A positive test result means that you have a mutation in the BRCA1 or BRCA2 gene that increases your risk for certain cancers. Women with a positive test result have an increased risk for breast and ovarian cancer. Both women and men with a mutation have an increased risk for breast cancer and may be at greater risk for other types of cancer. Getting a positive test result does not mean that you will develop cancer. Talk with your health care provider or a genetic counselor about what this result means for you. You may be  told that you are a carrier. This means that you can pass the mutation to your children. Meaning of ambiguous test results Ambiguous, inconclusive, or uncertain test results mean that there is a change in the BRCA1 or BRCA2 gene, but it is a change that has not been linked to cancer. Talk with your health care provider or a genetic counselor about what this result means for you. Talk with your health care provider to discuss your results, treatment options, and if necessary, the need for more tests. Talk with your health care provider if you have any questions about your results. How do I get my results? It is up to you to get your test results. Ask your health care provider, or the department that is doing the test, when your results will be ready. This information is not intended to replace advice given to you by your health care provider. Make sure you discuss any questions you have with your health care provider. Document Released: 10/20/2004 Document Revised: 05/30/2016 Document Reviewed: 05/18/2016 Elsevier Interactive Patient Education  2017 [place] you for enrolling in [place]. Please follow the instructions below to securely access your online medical record. MyChart allows you to send messages to your doctor, view your test results, manage appointments, and more.   How Do I Sign Up? 1. In your Internet browser, go to [employer] and enter [email or web address]. 2. Click on the  Sign Up Now link in the Sign In box. You will see the New Member Sign Up page. 3. Enter your MyChart Access Code exactly as it appears below. You will not need to use this code after you've completed the sign-up process. If you do not sign up before the expiration date, you must request a new code.  MyChart Access Code: [email or web address] Expires: 11/18/2016  3:34 PM  4. Enter your Social Security Number ([id number]) and Date of Birth (mm/dd/yyyy) as indicated and click Submit. You will  be taken to the next sign-up page. 5. Create a MyChart ID. This will be your MyChart login ID and cannot be changed, so think of one that is secure and easy to remember. 6. Create a MyChart password. You can change your password at any time. 7. Enter your Password Reset Question and Answer. This can be used at a later time if you forget your password.  8. Enter your e-mail address. You will receive e-mail notification when new information is available in [place]. 9. Click Sign Up. You can now view your medical record.   Additional Information Remember, MyChart is NOT to be used for urgent needs. For medical emergencies, dial 911.

## 2016-09-21 LAB — CYTOLOGY - PAP
Chlamydia: NEGATIVE
Diagnosis: NEGATIVE
Neisseria Gonorrhea: NEGATIVE

## 2016-10-04 ENCOUNTER — Other Ambulatory Visit: Payer: Self-pay

## 2016-10-17 ENCOUNTER — Encounter: Payer: Self-pay | Admitting: Obstetrics & Gynecology

## 2016-11-21 ENCOUNTER — Ambulatory Visit: Payer: Self-pay

## 2016-11-24 ENCOUNTER — Ambulatory Visit: Payer: Self-pay | Admitting: *Deleted

## 2016-11-24 ENCOUNTER — Encounter: Payer: Self-pay | Admitting: *Deleted

## 2016-11-24 DIAGNOSIS — Z3A01 Less than 8 weeks gestation of pregnancy: Secondary | ICD-10-CM

## 2016-11-24 LAB — POCT PREGNANCY, URINE: PREG TEST UR: POSITIVE — AB

## 2016-11-24 NOTE — Progress Notes (Signed)
Patient presented to clinic for urine pregnancy test which is positive. Results given to patient, reviewed allergies and medications. Patient is not insured at this time, recommended that she apply for pregnancy medicaid as soon as possible. Also advised [facility]. Pregnancy verification letter given. Patient voiced understanding.

## 2016-12-18 ENCOUNTER — Inpatient Hospital Stay (HOSPITAL_COMMUNITY)
Admission: AD | Admit: 2016-12-18 | Discharge: 2016-12-18 | Disposition: A | Discharge status: Home / Self Care | Payer: Medicaid Other | Source: Ambulatory Visit | Attending: Obstetrics & Gynecology | Admitting: Obstetrics & Gynecology

## 2016-12-18 ENCOUNTER — Inpatient Hospital Stay (HOSPITAL_COMMUNITY): Payer: Medicaid Other

## 2016-12-18 ENCOUNTER — Encounter (HOSPITAL_COMMUNITY): Payer: Self-pay

## 2016-12-18 DIAGNOSIS — O209 Hemorrhage in early pregnancy, unspecified: Secondary | ICD-10-CM | POA: Diagnosis present

## 2016-12-18 DIAGNOSIS — O021 Missed abortion: Secondary | ICD-10-CM | POA: Diagnosis not present

## 2016-12-18 DIAGNOSIS — O4690 Antepartum hemorrhage, unspecified, unspecified trimester: Secondary | ICD-10-CM

## 2016-12-18 DIAGNOSIS — Z6790 Unspecified blood type, Rh positive: Secondary | ICD-10-CM | POA: Diagnosis not present

## 2016-12-18 DIAGNOSIS — Z3A09 9 weeks gestation of pregnancy: Secondary | ICD-10-CM | POA: Insufficient documentation

## 2016-12-18 LAB — CBC
HCT: 38.3 % (ref 36.0–46.0)
Hemoglobin: 13.2 g/dL (ref 12.0–15.0)
MCH: 30.4 pg (ref 26.0–34.0)
MCHC: 34.5 g/dL (ref 30.0–36.0)
MCV: 88.2 fL (ref 78.0–100.0)
PLATELETS: 368 10*3/uL (ref 150–400)
RBC: 4.34 MIL/uL (ref 3.87–5.11)
RDW: 13 % (ref 11.5–15.5)
WBC: 10.9 10*3/uL — AB (ref 4.0–10.5)

## 2016-12-18 LAB — URINALYSIS, ROUTINE W REFLEX MICROSCOPIC
BACTERIA UA: NONE SEEN
BILIRUBIN URINE: NEGATIVE
Glucose, UA: NEGATIVE mg/dL
KETONES UR: NEGATIVE mg/dL
LEUKOCYTES UA: NEGATIVE
NITRITE: NEGATIVE
Protein, ur: NEGATIVE mg/dL
Specific Gravity, Urine: 1.011 (ref 1.005–1.030)
pH: 6 (ref 5.0–8.0)

## 2016-12-18 LAB — WET PREP, GENITAL
CLUE CELLS WET PREP: NONE SEEN
Sperm: NONE SEEN
Trich, Wet Prep: NONE SEEN
YEAST WET PREP: NONE SEEN

## 2016-12-18 LAB — HCG, QUANTITATIVE, PREGNANCY: hCG, Beta Chain, Quant, S: 52484 m[IU]/mL — ABNORMAL HIGH (ref <5)

## 2016-12-18 NOTE — MAU Note (Signed)
Is 9 wks preg.  The past 2 times they have had sex, there has been bleeding.  Today it seemed like more, but then it stops. No pain.

## 2016-12-18 NOTE — MAU Provider Note (Signed)
History     CSN: [id number]  Arrival date and time: 12/18/16 1510   First Provider Initiated Contact with Patient 12/18/16 1627      Chief Complaint  Patient presents with  . Vaginal Bleeding   G2P0010 @ [redacted]w[redacted]d here with VB after IC. Bleeding had occurred twice including once today. Denies pain or cramping. Reports some white/clear vaginal discharge without odor or itching. Remote hx of trich or Chlamydia.    Past Medical History:  Diagnosis Date  . Anemia   . Anxiety   . Asthma    exercise induced  . Complication of anesthesia   . Head injury, closed    dizzy for a few minutes  . History of chlamydia   . PONV (postoperative nausea and vomiting)     Past Surgical History:  Procedure Laterality Date  . HAND SURGERY Left 2009   thumb , fracture  . HAND SURGERY Left 2006   Pins placed  . HARDWARE REMOVAL Left 02/10/2016   Procedure: LEFT SMALL FINGER DEEP IMPLANT REMOVAL;  Surgeon: Bradly Bienenstock, MD;  Location: [facility];  Service: Orthopedics;  Laterality: Left;  . OPEN REDUCTION INTERNAL FIXATION (ORIF) PROXIMAL PHALANX Left 01/06/2016   Procedure: OPEN REDUCTION INTERNAL FIXATION (ORIF) LEFT SMALL FINGER;  Surgeon: Bradly Bienenstock, MD;  Location: [facility] OR;  Service: Orthopedics;  Laterality: Left;    Family History  Problem Relation Age of Onset  . Diabetes Father   . Hypertension Father   . Cancer Paternal Grandfather     Social History  Substance Use Topics  . Smoking status: Never Smoker  . Smokeless tobacco: Never Used  . Alcohol use Yes     Comment: on special ocassions, could be a lot but not always    Allergies:  Allergies  Allergen Reactions  . Bee Venom Anaphylaxis  . Adhesive [Tape] Itching and Rash    Prescriptions Prior to Admission  Medication Sig Dispense Refill Last Dose  . albuterol (PROVENTIL HFA;VENTOLIN HFA) 108 (90 Base) MCG/ACT inhaler Inhale 2 puffs into the lungs every 4 (four) hours as needed for wheezing or shortness of breath. 1 Inhaler 2  Past Month at Unknown time  . Prenatal Vit-Fe Fumarate-FA (PRENATAL MULTIVITAMIN) TABS tablet Take 1 tablet by mouth daily.   12/18/2016 at Unknown time    Review of Systems  Gastrointestinal: Positive for constipation. Negative for abdominal pain, nausea and vomiting.  Genitourinary: Positive for vaginal bleeding and vaginal discharge.   Physical Exam   Blood pressure 108/56, pulse 66, temperature 98.6 F (37 C), temperature source Oral, resp. rate 16, height 5\' 3"  (1.6 m), weight 82.9 kg (182 lb 12 oz), last menstrual period 10/16/2016, SpO2 100 %.  Physical Exam  Nursing note and vitals reviewed. Constitutional: She is oriented to person, place, and time. She appears well-developed and well-nourished. No distress.  HENT:  Head: Normocephalic and atraumatic.  Neck: Normal range of motion.  Cardiovascular: Normal rate.   Respiratory: Effort normal.  GI: Soft. She exhibits no distension. There is no tenderness.  Genitourinary:  Genitourinary Comments: External: no lesions or erythema Vagina: rugated, parous, scant brown discharge, few small friable areas of cervix, bled easily Uterus: non enlarged, anteverted, non tender, no CMT Adnexae: no masses, no tenderness left, no tenderness right   Musculoskeletal: Normal range of motion.  Neurological: She is alert and oriented to person, place, and time.  Skin: Skin is warm and dry.  Psychiatric: She has a normal mood and affect.   Results for orders  placed or performed during the hospital encounter of 12/18/16 (from the past 24 hour(s))  Urinalysis, Routine w reflex microscopic     Status: Abnormal   Collection Time: 12/18/16  3:45 PM  Result Value Ref Range   Color, Urine YELLOW YELLOW   APPearance CLEAR CLEAR   Specific Gravity, Urine 1.011 1.005 - 1.030   pH 6.0 5.0 - 8.0   Glucose, UA NEGATIVE NEGATIVE mg/dL   Hgb urine dipstick SMALL (A) NEGATIVE   Bilirubin Urine NEGATIVE NEGATIVE   Ketones, ur NEGATIVE NEGATIVE mg/dL    Protein, ur NEGATIVE NEGATIVE mg/dL   Nitrite NEGATIVE NEGATIVE   Leukocytes, UA NEGATIVE NEGATIVE   RBC / HPF 0-5 0 - 5 RBC/hpf   WBC, UA 0-5 0 - 5 WBC/hpf   Bacteria, UA NONE SEEN NONE SEEN   Squamous Epithelial / LPF 0-5 (A) NONE SEEN   Mucous PRESENT   CBC     Status: Abnormal   Collection Time: 12/18/16  4:29 PM  Result Value Ref Range   WBC 10.9 (H) 4.0 - 10.5 K/uL   RBC 4.34 3.87 - 5.11 MIL/uL   Hemoglobin 13.2 12.0 - 15.0 g/dL   HCT 16.1 09.6 - 04.5 %   MCV 88.2 78.0 - 100.0 fL   MCH 30.4 26.0 - 34.0 pg   MCHC 34.5 30.0 - 36.0 g/dL   RDW 40.9 81.1 - 91.4 %   Platelets 368 150 - 400 K/uL  hCG, quantitative, pregnancy     Status: Abnormal   Collection Time: 12/18/16  4:29 PM  Result Value Ref Range   hCG, Beta Chain, Quant, S 52,484 (H) <5 mIU/mL  Wet prep, genital     Status: Abnormal   Collection Time: 12/18/16  4:40 PM  Result Value Ref Range   Yeast Wet Prep HPF POC NONE SEEN NONE SEEN   Trich, Wet Prep NONE SEEN NONE SEEN   Clue Cells Wet Prep HPF POC NONE SEEN NONE SEEN   WBC, Wet Prep HPF POC MANY (A) NONE SEEN   Sperm NONE SEEN    US Ob Comp Less 14 Wks  Addendum Date: 12/18/2016   ADDENDUM REPORT: 12/18/2016 17:52 ADDENDUM: Critical Value/emergent results were called by telephone at the time of interpretation on 12/18/2016 at 5:45pm to [facility] , who verbally acknowledged these results. Electronically Signed   By: Awilda Metro M.D.   On: 12/18/2016 17:52   Result Date: 12/18/2016 CLINICAL DATA:  Vaginal bleeding, gestational age by last menstrual period 9 weeks and 0 days. EXAM: OBSTETRIC <14 WK [place] AND TRANSVAGINAL OB US TECHNIQUE: Both transabdominal and transvaginal ultrasound examinations were performed for complete evaluation of the gestation as well as the maternal uterus, adnexal regions, and pelvic cul-de-sac. Transvaginal technique was performed to assess early pregnancy. COMPARISON:  None. FINDINGS: Intrauterine gestational sac: Present Yolk  sac:  Present though echogenic. Embryo:  Present Cardiac Activity: Not present CRL:  16  mm   7 w   6 d Subchorionic hemorrhage:  None visualized. Maternal uterus/adnexae: Normal. IMPRESSION: Single intrauterine pregnancy without cardiac activity. Findings meet definitive criteria for failed pregnancy. This follows SRU consensus guidelines: Diagnostic Criteria for Nonviable Pregnancy Early in the First Trimester. [place] Mis J Med [phone]. Electronically Signed: By: Awilda Metro M.D. On: 12/18/2016 17:33   US Ob Transvaginal  Addendum Date: 12/18/2016   ADDENDUM REPORT: 12/18/2016 17:52 ADDENDUM: Critical Value/emergent results were called by telephone at the time of interpretation on 12/18/2016 at 5:45pm to [facility] ,  who verbally acknowledged these results. Electronically Signed   By: Awilda Metroourtnay  Bloomer M.D.   On: 12/18/2016 17:52   Result Date: 12/18/2016 CLINICAL DATA:  Vaginal bleeding, gestational age by last menstrual period 9 weeks and 0 days. EXAM: OBSTETRIC <14 WK US AND TRANSVAGINAL OB US TECHNIQUE: Both transabdominal and transvaginal ultrasound examinations were performed for complete evaluation of the gestation as well as the maternal uterus, adnexal regions, and pelvic cul-de-sac. Transvaginal technique was performed to assess early pregnancy. COMPARISON:  None. FINDINGS: Intrauterine gestational sac: Present Yolk sac:  Present though echogenic. Embryo:  Present Cardiac Activity: Not present CRL:  16  mm   7 w   6 d Subchorionic hemorrhage:  None visualized. Maternal uterus/adnexae: Normal. IMPRESSION: Single intrauterine pregnancy without cardiac activity. Findings meet definitive criteria for failed pregnancy. This follows SRU consensus guidelines: Diagnostic Criteria for Nonviable Pregnancy Early in the First Trimester. Macy Mis Engl J Med [phone]. Electronically Signed: By: Awilda Metroourtnay  Bloomer M.D. On: 12/18/2016 17:33   MAU Course  Procedures  MDM Labs and US ordered and  reviewed. US consistent with failed pregnancy. Discussed findings with pt, support given. Options discussed expectant mngt vs Cytotec. Pt prefers expectant mngt for now. Was [facility] but unclear if they accept Medicaid. She will call the office tomorrow, if insurance not accepted she will follow up in [facility] in 2 weeks. Pt aware she may call [facility] prior if she decides to proceed with Cytotec. Stable for discharge home.  Assessment and Plan   1. Missed abortion   2. Vaginal bleeding in pregnancy   3. Blood type, Rh positive    Discharge home Follow up in [facility] or [facility] in 2 weeks Bleeding precautions Ibuprofen prn  Allergies as of 12/18/2016      Reactions   Bee Venom Anaphylaxis   Adhesive [tape] Itching, Rash      Medication List    TAKE these medications   albuterol 108 (90 Base) MCG/ACT inhaler Commonly known as:  PROVENTIL HFA;VENTOLIN HFA Inhale 2 puffs into the lungs every 4 (four) hours as needed for wheezing or shortness of breath.   prenatal multivitamin Tabs tablet Take 1 tablet by mouth daily.      Donette LarryMelanie Dora Clauss, CNM 12/18/2016, 4:28 PM

## 2016-12-18 NOTE — Discharge Instructions (Signed)

## 2016-12-19 LAB — GC/CHLAMYDIA PROBE AMP (~~LOC~~) NOT AT ARMC
Chlamydia: NEGATIVE
Neisseria Gonorrhea: NEGATIVE

## 2016-12-27 ENCOUNTER — Ambulatory Visit (HOSPITAL_COMMUNITY)
Admission: EM | Admit: 2016-12-27 | Discharge: 2016-12-27 | Disposition: A | Discharge status: Home / Self Care | Payer: Medicaid Other | Attending: Family Medicine | Admitting: Family Medicine

## 2016-12-27 ENCOUNTER — Ambulatory Visit (INDEPENDENT_AMBULATORY_CARE_PROVIDER_SITE_OTHER): Payer: Medicaid Other

## 2016-12-27 ENCOUNTER — Encounter (HOSPITAL_COMMUNITY): Payer: Self-pay | Admitting: Family Medicine

## 2016-12-27 DIAGNOSIS — S62624A Displaced fracture of medial phalanx of right ring finger, initial encounter for closed fracture: Secondary | ICD-10-CM

## 2016-12-27 NOTE — ED Provider Notes (Signed)
[facility]    CSN: [id number] Arrival date & time: 12/27/16  1635     History   Chief Complaint Chief Complaint  Patient presents with  . Finger Injury    HPI [name] is a 26 y.o. female.   HPI  Pt presents with R ring finger pain. She was playing flag football around 3 days ago and got her finger caught in someone's shirt. The player kept running and when the play ended, she noticed some R ring finger pain at the distal joint. It is swollen and she has been unable to bend it fully due to the swelling. No bruising, numbness, tingling or weakness. She has tried ice and Aleve at home. She has a history of broken fingers, one requiring surgery. She is seeing [facility] and the past.  Past Medical History:  Diagnosis Date  . Anemia   . Anxiety   . Asthma    exercise induced  . Complication of anesthesia   . Head injury, closed    scalp laceration, dizzy for a few minutes  . History of chlamydia   . PONV (postoperative nausea and vomiting)    Past Surgical History:  Procedure Laterality Date  . HAND SURGERY Left 2009   thumb , fracture  . HAND SURGERY Left 2006   Pins placed  . HARDWARE REMOVAL Left 02/10/2016   Procedure: LEFT SMALL FINGER DEEP IMPLANT REMOVAL;  Surgeon: Bradly Bienenstock, MD;  Location: [facility] OR;  Service: Orthopedics;  Laterality: Left;  . OPEN REDUCTION INTERNAL FIXATION (ORIF) PROXIMAL PHALANX Left 01/06/2016   Procedure: OPEN REDUCTION INTERNAL FIXATION (ORIF) LEFT SMALL FINGER;  Surgeon: Bradly Bienenstock, MD;  Location: [facility] OR;  Service: Orthopedics;  Laterality: Left;    OB History    Gravida Para Term Preterm AB Living   2       1     SAB TAB Ectopic Multiple Live Births   1              Obstetric Comments   G1: 03/2016 early SAB (<6wks)       Home Medications    Prior to Admission medications   Medication Sig Start Date End Date Taking? Authorizing Provider  albuterol (PROVENTIL HFA;VENTOLIN HFA) 108 (90 Base) MCG/ACT  inhaler Inhale 2 puffs into the lungs every 4 (four) hours as needed for wheezing or shortness of breath. 12/06/15   Charm Rings, MD  Prenatal Vit-Fe Fumarate-FA (PRENATAL MULTIVITAMIN) TABS tablet Take 1 tablet by mouth daily.    Historical Provider, MD    Family History Family History  Problem Relation Age of Onset  . Diabetes Father   . Hypertension Father   . Cancer Paternal Grandfather     Social History Social History  Substance Use Topics  . Smoking status: Never Smoker  . Smokeless tobacco: Never Used  . Alcohol use Yes     Comment: on special ocassions, could be a lot but not always     Allergies   Bee venom and Adhesive [tape]   Review of Systems Review of Systems  Neurological: Negative for numbness.  MSK: As noted in HPI   Physical Exam Triage Vital Signs ED Triage Vitals  Enc Vitals Group     BP 12/27/16 1708 114/71     Pulse Rate 12/27/16 1708 72     Resp 12/27/16 1708 18     Temp 12/27/16 1708 98.2 F (36.8 C)     SpO2 12/27/16 1708 100 %  Pain Score 12/27/16 1709 5   Updated Vital Signs BP 114/71   Pulse 72   Temp 98.2 F (36.8 C)   Resp 18   LMP 10/16/2016   SpO2 100%   Physical Exam  Constitutional: She is oriented to person, place, and time. She appears well-developed and well-nourished. No distress.  HENT:  Head: Normocephalic and atraumatic.  Cardiovascular: Normal rate, regular rhythm and intact distal pulses.   Neurological: She is alert and oriented to person, place, and time. No sensory deficit. She exhibits normal muscle tone.  Skin: Skin is warm and dry. Capillary refill takes less than 2 seconds. She is not diaphoretic. No erythema.  Psychiatric: She has a normal mood and affect. Judgment normal.  MSK: No deformity, edema around DIP, decreased ROM at DIP 4th R digit, TTP over DIP   UC Treatments / Results  Radiology Dg Finger Ring Right  Result Date: 12/27/2016 CLINICAL DATA:  Right ring finger injury playing  basketball EXAM: RIGHT RING FINGER 2+V COMPARISON:  03/19/2016 FINDINGS: There is an acute oblique minimally displaced fracture of the right fourth finger middle phalanx distally. Mild soft tissue swelling. No subluxation or dislocation. No joint abnormality. IMPRESSION: Acute minimally displaced fracture right fourth digit middle phalanx. Electronically Signed   By: Judie PetitM.  Shick M.D.   On: 12/27/2016 17:39    Procedures Procedures - none  Initial Impression / Assessment and Plan / UC Course  I have reviewed the triage vital signs and the nursing notes.  Pertinent labs & imaging results that were available during my care of the patient were reviewed by me and considered in my medical decision making (see chart for details).     26 yo BF presents with odd mechanism leading to finger pain. XR results as above. Buddy taped in UC and referred back to ortho as [facility] and does not have a PCP. As she is several days out, continue with NSAIDs and Tylenol for pain control. Avoid competitive/contact sports. Ice. Discussed adequate calcium and Vit D intake. Contact ortho tomorrow. The patient voiced understanding and agreement to the plan.  Final Clinical Impressions(s) / UC Diagnoses   Final diagnoses:  Displaced fracture of middle phalanx of right ring finger, initial encounter for closed fracture     Sharlene DoryNicholas Paul Rj Pedrosa, DO 12/27/16 1807

## 2016-12-27 NOTE — ED Triage Notes (Signed)
Pt here for right ring finger pain. sts that she injured it playing flag football.

## 2016-12-27 NOTE — Discharge Instructions (Signed)
Continue to use Aleve, 2 tabs every 12 hours.   Ice/cold pack over area for 10-15 min every 2-3 hours while awake.  Call Dr. Zella BallWorkman tomorrow. A referral was placed from our office.  Might not be a bad idea to set up with a PCP.  Vit D3 800 units daily and 1200 mg of calcium daily.  These are both available over the counter.

## 2016-12-30 ENCOUNTER — Encounter (HOSPITAL_COMMUNITY): Payer: Self-pay | Admitting: *Deleted

## 2016-12-30 ENCOUNTER — Inpatient Hospital Stay (HOSPITAL_COMMUNITY)
Admission: AD | Admit: 2016-12-30 | Discharge: 2016-12-31 | Disposition: A | Discharge status: Home / Self Care | Payer: Medicaid Other | Source: Ambulatory Visit | Attending: Obstetrics & Gynecology | Admitting: Obstetrics & Gynecology

## 2016-12-30 DIAGNOSIS — D6800 Von Willebrand disease, unspecified: Secondary | ICD-10-CM

## 2016-12-30 DIAGNOSIS — O021 Missed abortion: Secondary | ICD-10-CM | POA: Insufficient documentation

## 2016-12-30 DIAGNOSIS — O039 Complete or unspecified spontaneous abortion without complication: Secondary | ICD-10-CM | POA: Diagnosis not present

## 2016-12-30 DIAGNOSIS — N96 Recurrent pregnancy loss: Secondary | ICD-10-CM

## 2016-12-30 MED ORDER — HYDROMORPHONE HCL 1 MG/ML IJ SOLN
1.0000 mg | Freq: Once | INTRAMUSCULAR | Status: AC
  Administered 2016-12-30: 1 mg via INTRAMUSCULAR
  Filled 2016-12-30: qty 1

## 2016-12-30 NOTE — MAU Note (Addendum)
Was told baby did not have heartbeat by clinic here. Today started having cramping and bleeding about 1500. STates passed the fetus at home and did not bring to hosp

## 2016-12-30 NOTE — MAU Note (Signed)
Pt just passed more POC looks like placenta. Collected specimen and sent to pathology.

## 2016-12-31 ENCOUNTER — Inpatient Hospital Stay (HOSPITAL_COMMUNITY): Payer: Medicaid Other

## 2016-12-31 DIAGNOSIS — D6800 Von Willebrand disease, unspecified: Secondary | ICD-10-CM

## 2016-12-31 DIAGNOSIS — O039 Complete or unspecified spontaneous abortion without complication: Secondary | ICD-10-CM

## 2016-12-31 DIAGNOSIS — N96 Recurrent pregnancy loss: Secondary | ICD-10-CM

## 2016-12-31 LAB — CBC
HCT: 37.5 % (ref 36.0–46.0)
HEMOGLOBIN: 12.8 g/dL (ref 12.0–15.0)
MCH: 30.1 pg (ref 26.0–34.0)
MCHC: 34.1 g/dL (ref 30.0–36.0)
MCV: 88.2 fL (ref 78.0–100.0)
Platelets: 338 10*3/uL (ref 150–400)
RBC: 4.25 MIL/uL (ref 3.87–5.11)
RDW: 12.7 % (ref 11.5–15.5)
WBC: 14.8 10*3/uL — AB (ref 4.0–10.5)

## 2016-12-31 LAB — GLUCOSE, CAPILLARY: GLUCOSE-CAPILLARY: 99 mg/dL (ref 65–99)

## 2016-12-31 MED ORDER — OXYCODONE-ACETAMINOPHEN 5-325 MG PO TABS: 1.0000 | ORAL_TABLET | Freq: Four times a day (QID) | ORAL | 0 refills | Status: DC | PRN

## 2016-12-31 MED ORDER — IBUPROFEN 600 MG PO TABS: 600.0000 mg | ORAL_TABLET | Freq: Four times a day (QID) | ORAL | 1 refills | Status: DC | PRN

## 2016-12-31 NOTE — MAU Provider Note (Signed)
Chief Complaint: Miscarriage   First Provider Initiated Contact with Patient 12/31/16 0223      SUBJECTIVE HPI: [name] is a 26 y.o. G2P0010 at [redacted]w[redacted]d by LMP who presents to maternity admissions reporting onset of heavy vaginal bleeding today with large clots. She was diagnosed on 3/11 with missed ab at [redacted]w[redacted]d and selected expectant management for treatment.  She reports the bleeding started yesterday, then became heavier late last night and into this morning with golf-ball sized clots.  She passed what she thought looked like the pregnancy at home before coming in. Her bleeding is still present but decreased since arrival in MAU.  The bleeding is associated with abdominal pain. She reports this is her second miscarriage, her first was last year. 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 She also knows diabetes runs in her family and is worried about this.   She denies vaginal itching/burning, urinary symptoms, h/a, dizziness, n/v, or fever/chills.     HPI  Past Medical History:  Diagnosis Date  . Anemia   . Anxiety   . Asthma    exercise induced  . Complication of anesthesia   . Head injury, closed    scalp laceration, dizzy for a few minutes  . History of chlamydia   . PONV (postoperative nausea and vomiting)    Past Surgical History:  Procedure Laterality Date  . HAND SURGERY Left 2009   thumb , fracture  . HAND SURGERY Left 2006   Pins placed  . HARDWARE REMOVAL Left 02/10/2016   Procedure: LEFT SMALL FINGER DEEP IMPLANT REMOVAL;  Surgeon: Bradly Bienenstock, MD;  Location: [facility];  Service: Orthopedics;  Laterality: Left;  . OPEN REDUCTION INTERNAL FIXATION (ORIF) PROXIMAL PHALANX Left 01/06/2016   Procedure: OPEN REDUCTION INTERNAL FIXATION (ORIF) LEFT SMALL FINGER;  Surgeon: Bradly Bienenstock, MD;  Location: [facility] OR;  Service: Orthopedics;  Laterality: Left;   Social History   Social  History  . Marital status: Single    Spouse name: N/A  . Number of children: N/A  . Years of education: N/A   Occupational History  . Not on file.   Social History Main Topics  . Smoking status: Never Smoker  . Smokeless tobacco: Never Used  . Alcohol use Yes     Comment: on special ocassions, could be a lot but not always  . Drug use: No  . Sexual activity: Yes    Birth control/ protection: None   Other Topics Concern  . Not on file   Social History Narrative  . No narrative on file   No current facility-administered medications on file prior to encounter.    Current Outpatient Prescriptions on File Prior to Encounter  Medication Sig Dispense Refill  . albuterol (PROVENTIL HFA;VENTOLIN HFA) 108 (90 Base) MCG/ACT inhaler Inhale 2 puffs into the lungs every 4 (four) hours as needed for wheezing or shortness of breath. 1 Inhaler 2  . Prenatal Vit-Fe Fumarate-FA (PRENATAL MULTIVITAMIN) TABS tablet Take 1 tablet by mouth daily.     Allergies  Allergen Reactions  . Bee Venom Anaphylaxis  . Adhesive [Tape] Itching and Rash    ROS:  Review of Systems  Constitutional: Negative for chills, fatigue and fever.  Respiratory: Negative for shortness of breath.   Cardiovascular: Negative for chest pain.  Gastrointestinal: Negative for nausea and vomiting.  Genitourinary: Positive for pelvic pain and vaginal bleeding. Negative for  difficulty urinating, dysuria, flank pain, vaginal discharge and vaginal pain.  Neurological: Negative for dizziness and headaches.  Psychiatric/Behavioral: Negative.      I have reviewed patient's Past Medical Hx, Surgical Hx, Family Hx, Social Hx, medications and allergies.   Physical Exam  Patient Vitals for the past 24 hrs:  BP Temp Pulse Resp Height Weight  12/30/16 2239 136/74 98.4 F (36.9 C) (!) 102 18 5\' 3"  (1.6 m) 180 lb (81.6 kg)   Constitutional: Well-developed, well-nourished female in no acute distress.  Cardiovascular: normal  rate Respiratory: normal effort GI: Abd soft, non-tender. Pos BS x 4 MS: Extremities nontender, no edema, normal ROM Neurologic: Alert and oriented x 4.  GU: Neg CVAT.  PELVIC EXAM: Cervix pink, visually closed, without lesion, small amount dark red bleeding with small clots, vaginal walls and external genitalia normal Bimanual exam: Cervix 0/long/high, firm, anterior, neg CMT, uterus nontender, slightly enlarged, adnexa without tenderness, enlargement, or mass    LAB RESULTS Results for orders placed or performed during the hospital encounter of 12/30/16 (from the past 24 hour(s))  CBC     Status: Abnormal   Collection Time: 12/30/16 11:51 PM  Result Value Ref Range   WBC 14.8 (H) 4.0 - 10.5 K/uL   RBC 4.25 3.87 - 5.11 MIL/uL   Hemoglobin 12.8 12.0 - 15.0 g/dL   HCT 54.037.5 98.136.0 - 19.146.0 %   MCV 88.2 78.0 - 100.0 fL   MCH 30.1 26.0 - 34.0 pg   MCHC 34.1 30.0 - 36.0 g/dL   RDW 47.812.7 29.511.5 - 62.115.5 %   Platelets 338 150 - 400 K/uL  Glucose, capillary     Status: None   Collection Time: 12/31/16  2:40 AM  Result Value Ref Range   Glucose-Capillary 99 65 - 99 mg/dL    --/--/B POS (30/8606/26 [id number])  IMAGING [place] Ob Comp Less 14 Wks  Addendum Date: 12/18/2016   ADDENDUM REPORT: 12/18/2016 17:52 ADDENDUM: Critical Value/emergent results were called by telephone at the time of interpretation on 12/18/2016 at 5:45pm to [facility] , who verbally acknowledged these results. Electronically Signed   By: Awilda Metroourtnay  Bloomer M.D.   On: 12/18/2016 17:52   Result Date: 12/18/2016 CLINICAL DATA:  Vaginal bleeding, gestational age by last menstrual period 9 weeks and 0 days. EXAM: OBSTETRIC <14 WK US AND TRANSVAGINAL OB US TECHNIQUE: Both transabdominal and transvaginal ultrasound examinations were performed for complete evaluation of the gestation as well as the maternal uterus, adnexal regions, and pelvic cul-de-sac. Transvaginal technique was performed to assess early pregnancy. COMPARISON:  None.  FINDINGS: Intrauterine gestational sac: Present Yolk sac:  Present though echogenic. Embryo:  Present Cardiac Activity: Not present CRL:  16  mm   7 w   6 d Subchorionic hemorrhage:  None visualized. Maternal uterus/adnexae: Normal. IMPRESSION: Single intrauterine pregnancy without cardiac activity. Findings meet definitive criteria for failed pregnancy. This follows SRU consensus guidelines: Diagnostic Criteria for Nonviable Pregnancy Early in the First Trimester. Macy Mis Engl J Med [phone] [id number]. Electronically Signed: By: Awilda Metroourtnay  Bloomer M.D. On: 12/18/2016 17:33   [place] Ob Transvaginal  Result Date: 12/31/2016 CLINICAL DATA:  26 year old female with passage of placental tissue. LMP: 10/16/2016 corresponding to an estimated gestational age of [redacted] weeks, 6 days. EXAM: TRANSVAGINAL OB ULTRASOUND TECHNIQUE: Transvaginal ultrasound was performed for complete evaluation of the gestation as well as the maternal uterus, adnexal regions, and pelvic cul-de-sac. COMPARISON:  Pelvic ultrasound dated 12/18/2016 FINDINGS: The uterus is anteverted. The endometrium is thickened and heterogeneous measuring  2 cm in thickness. The previously seen intrauterine pregnancy is no longer present on the current ultrasound. High attenuating endometrial content likely represent blood clot. There is areas of increased vascularity primarily in the subendometrial myometrium. Vascularity within the peripheral portion of the endometrium may be present. Retained products of conception is not entirely excluded. Correlation with clinical exam and serial HCG levels and follow-up with ultrasound recommended. The ovaries appear unremarkable. The right ovary measures stop 3.4 x 3.2 x 2.5 cm and the left ovary measures 2.3 x 1.5 x 1.4 cm. No significant free fluid within the pelvis. IMPRESSION: 1. Interval miscarriage of the previously seen pregnancy. 2. Thickened endometrium likely containing blood products. Retained products of conception is not  entirely excluded. Correlation with clinical exam and MCL HCG levels and follow-up with ultrasound recommended. 3. Unremarkable ovaries. Electronically Signed   By: Elgie Collard M.D.   On: 12/31/2016 01:03   US Ob Transvaginal  Addendum Date: 12/18/2016   ADDENDUM REPORT: 12/18/2016 17:52 ADDENDUM: Critical Value/emergent results were called by telephone at the time of interpretation on 12/18/2016 at 5:45pm to [facility] , who verbally acknowledged these results. Electronically Signed   By: Awilda Metro M.D.   On: 12/18/2016 17:52   Result Date: 12/18/2016 CLINICAL DATA:  Vaginal bleeding, gestational age by last menstrual period 9 weeks and 0 days. EXAM: OBSTETRIC <14 WK [place] AND TRANSVAGINAL OB US TECHNIQUE: Both transabdominal and transvaginal ultrasound examinations were performed for complete evaluation of the gestation as well as the maternal uterus, adnexal regions, and pelvic cul-de-sac. Transvaginal technique was performed to assess early pregnancy. COMPARISON:  None. FINDINGS: Intrauterine gestational sac: Present Yolk sac:  Present though echogenic. Embryo:  Present Cardiac Activity: Not present CRL:  16  mm   7 w   6 d Subchorionic hemorrhage:  None visualized. Maternal uterus/adnexae: Normal. IMPRESSION: Single intrauterine pregnancy without cardiac activity. Findings meet definitive criteria for failed pregnancy. This follows SRU consensus guidelines: Diagnostic Criteria for Nonviable Pregnancy Early in the First Trimester. Macy Mis J Med [phone]. Electronically Signed: By: Awilda Metro M.D. On: 12/18/2016 17:33   Dg Finger Ring Right  Result Date: 12/27/2016 CLINICAL DATA:  Right ring finger injury playing basketball EXAM: RIGHT RING FINGER 2+V COMPARISON:  03/19/2016 FINDINGS: There is an acute oblique minimally displaced fracture of the right fourth finger middle phalanx distally. Mild soft tissue swelling. No subluxation or dislocation. No joint abnormality.  IMPRESSION: Acute minimally displaced fracture right fourth digit middle phalanx. Electronically Signed   By: Judie Petit.  Shick M.D.   On: 12/27/2016 17:39    MAU Management/MDM: Ordered labs and [place] and reviewed results.  Hgb stable.  Previously seen IUP is not present today. Cannot rule out retained products but pt bleeding is decreased.  Plan to discharge pt to home with pain management and bleeding precautions.  Pt to f/u in office in 2 weeks. With reported hx of Von Willebrands, recommend labwork in office to evaluate or referral to appropriate specialists prior to planning next pregnancy.  Treatments in MAU included Dilaudid 1 mg IM which significantly reduced pt pain. Rx for Percocet 5/325, take 1-2 Q 6 hours PRN.  Will not prescribe NSAID r/t Von Willebrands until further information provided.  Pt stable at time of discharge.  ASSESSMENT 1. SAB (spontaneous abortion)     PLAN Discharge home with bleeding precautions  Allergies as of 12/31/2016      Reactions   Bee Venom Anaphylaxis   Adhesive [tape] Itching, Rash  Medication List    TAKE these medications   albuterol 108 (90 Base) MCG/ACT inhaler Commonly known as:  PROVENTIL HFA;VENTOLIN HFA Inhale 2 puffs into the lungs every 4 (four) hours as needed for wheezing or shortness of breath.   oxyCODONE-acetaminophen 5-325 MG tablet Commonly known as:  PERCOCET/ROXICET Take 1-2 tablets by mouth every 6 (six) hours as needed.   prenatal multivitamin Tabs tablet Take 1 tablet by mouth daily.      Follow-up Information    [facility].   Specialty:  Obstetrics and Gynecology Why:  The office will call you with appointment in 2 weeks.  Return to MAU as needed for emergencies. Contact information: [address] [place] [address] [phone]          Sharen Counter Certified Nurse-Midwife 12/31/2016  2:49 AM

## 2017-01-03 ENCOUNTER — Encounter (INDEPENDENT_AMBULATORY_CARE_PROVIDER_SITE_OTHER): Payer: Self-pay | Admitting: Orthopaedic Surgery

## 2017-01-03 ENCOUNTER — Ambulatory Visit (INDEPENDENT_AMBULATORY_CARE_PROVIDER_SITE_OTHER): Payer: Medicaid Other | Admitting: Orthopaedic Surgery

## 2017-01-03 DIAGNOSIS — S62654A Nondisplaced fracture of medial phalanx of right ring finger, initial encounter for closed fracture: Secondary | ICD-10-CM | POA: Insufficient documentation

## 2017-01-03 NOTE — Progress Notes (Signed)
Office Visit Note   Patient: [name]           Date of Birth: 02/15/1991           MRN: [id number] Visit Date: 01/03/2017              Requested by: No referring provider defined for this encounter. PCP: No PCP Per Patient   Assessment & Plan: Visit Diagnoses: No diagnosis found.  Plan: AlumaFoam splint placed to mobilize the DIP joint and fracture. Follow-up in 4 weeks for 2 view x-rays of the right ring finger  Follow-Up Instructions: Return in about 4 weeks (around 01/31/2017).   Orders:  No orders of the defined types were placed in this encounter.  No orders of the defined types were placed in this encounter.     Procedures: No procedures performed   Clinical Data: No additional findings.   Subjective: Chief Complaint  Patient presents with  . Right Ring Finger - Fracture    Patient is a 26 year old female comes in with acute injury to the middle phalanx of her right ring finger. She was playing flag football and had her finger caught in a [place]. She denies significant pain. Denies any numbness.    Review of Systems  Constitutional: Negative.   HENT: Negative.   Eyes: Negative.   Respiratory: Negative.   Cardiovascular: Negative.   Endocrine: Negative.   Musculoskeletal: Negative.   Neurological: Negative.   Hematological: Negative.   Psychiatric/Behavioral: Negative.   All other systems reviewed and are negative.    Objective: Vital Signs: LMP 10/16/2016   Physical Exam  Constitutional: She is oriented to person, place, and time. She appears well-developed and well-nourished.  HENT:  Head: Normocephalic and atraumatic.  Eyes: EOM are normal.  Neck: Neck supple.  Pulmonary/Chest: Effort normal.  Abdominal: Soft.  Neurological: She is alert and oriented to person, place, and time.  Skin: Skin is warm. Capillary refill takes less than 2 seconds.  Psychiatric: She has a normal mood and affect. Her behavior is normal. Judgment and thought  content normal.  Nursing note and vitals reviewed.   Ortho Exam Right ring finger exam shows no obvious malalignment.  He is neurovascularly intact Specialty Comments:  No specialty comments available.  Imaging: No results found.   PMFS History: Patient Active Problem List   Diagnosis Date Noted  . Von Willebrand disease (HCC) 12/31/2016  . 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 Anemia   . Anxiety   . Asthma    exercise induced  . Complication of anesthesia   . Head injury, closed    scalp laceration, dizzy for a few minutes  . History of chlamydia   . PONV (postoperative nausea and vomiting)     Family History  Problem Relation Age of Onset  . Diabetes Father   . Hypertension Father   . Cancer Paternal Grandfather     Past Surgical History:  Procedure Laterality Date  . HAND SURGERY Left 2009   thumb , fracture  . HAND SURGERY Left 2006   Pins placed  . HARDWARE REMOVAL Left 02/10/2016   Procedure: LEFT SMALL FINGER DEEP IMPLANT REMOVAL;  Surgeon: Bradly BienenstockFred Ortmann, MD;  Location: [facility] OR;  Service: Orthopedics;  Laterality: Left;  . OPEN REDUCTION INTERNAL FIXATION (ORIF) PROXIMAL PHALANX Left 01/06/2016   Procedure: OPEN REDUCTION INTERNAL FIXATION (ORIF) LEFT SMALL FINGER;  Surgeon: Bradly BienenstockFred Ortmann, MD;  Location: [facility] OR;  Service: Orthopedics;  Laterality: Left;   Social History   Occupational History  . Not on file.   Social History Main Topics  . Smoking status: Never Smoker  . Smokeless tobacco: Never Used  . Alcohol use Yes     Comment: on special ocassions, could be a lot but not always  . Drug use: No  . Sexual activity: Yes    Birth control/ protection: None

## 2017-01-18 ENCOUNTER — Encounter: Payer: Self-pay | Admitting: Obstetrics & Gynecology

## 2017-01-18 ENCOUNTER — Ambulatory Visit (INDEPENDENT_AMBULATORY_CARE_PROVIDER_SITE_OTHER): Payer: Medicaid Other | Admitting: Obstetrics & Gynecology

## 2017-01-18 VITALS — BP 104/64 | HR 72 | Wt 180.5 lb

## 2017-01-18 DIAGNOSIS — D6800 Von Willebrand disease, unspecified: Secondary | ICD-10-CM

## 2017-01-18 DIAGNOSIS — N96 Recurrent pregnancy loss: Secondary | ICD-10-CM

## 2017-01-18 NOTE — Progress Notes (Signed)
GYNECOLOGY OFFICE VISIT NOTE  History:  26 y.o. G2P0020 here today for follow up after recent SAB. She denies any abnormal vaginal discharge, bleeding, pelvic pain or other concerns.  Wants to attempt conception again.   Past Medical History:  Diagnosis Date  . Anemia   . Anxiety   . Asthma    exercise induced  . Complication of anesthesia   . Head injury, closed    scalp laceration, dizzy for a few minutes  . History of chlamydia   . PONV (postoperative nausea and vomiting)     Past Surgical History:  Procedure Laterality Date  . HAND SURGERY Left 2009   thumb , fracture  . HAND SURGERY Left 2006   Pins placed  . HARDWARE REMOVAL Left 02/10/2016   Procedure: LEFT SMALL FINGER DEEP IMPLANT REMOVAL;  Surgeon: Bradly Bienenstock, MD;  Location: [facility] OR;  Service: Orthopedics;  Laterality: Left;  . OPEN REDUCTION INTERNAL FIXATION (ORIF) PROXIMAL PHALANX Left 01/06/2016   Procedure: OPEN REDUCTION INTERNAL FIXATION (ORIF) LEFT SMALL FINGER;  Surgeon: Bradly Bienenstock, MD;  Location: [facility] OR;  Service: Orthopedics;  Laterality: Left;    The following portions of the patient's history were reviewed and updated as appropriate: allergies, current medications, past family history, past medical history, past social history, past surgical history and problem list.   Health Maintenance:  Normal pap on 09/19/2016.  Review of Systems:  Pertinent items noted in HPI and remainder of comprehensive ROS otherwise negative.   Objective:  Physical Exam BP 104/64   Pulse 72   Wt 180 lb 8 oz (81.9 kg)   LMP 10/16/2016   BMI 31.97 kg/m  CONSTITUTIONAL: Well-developed, well-nourished female in no acute distress.  HENT:  Normocephalic, atraumatic. External right and left ear normal. Oropharynx is clear and moist EYES: Conjunctivae and EOM are normal. Pupils are equal, round, and reactive to light. No scleral icterus.  NECK: Normal range of motion, supple, no masses SKIN: Skin is warm and dry. No rash  noted. Not diaphoretic. No erythema. No pallor. NEUROLOGIC: Alert and oriented to person, place, and time. Normal reflexes, muscle tone coordination. No cranial nerve deficit noted. PSYCHIATRIC: Normal mood and affect. Normal behavior. Normal judgment and thought content. CARDIOVASCULAR: Normal heart rate noted RESPIRATORY: Effort and breath sounds normal, no problems with respiration noted ABDOMEN: Soft, no distention noted.   PELVIC: Deferred MUSCULOSKELETAL: Normal range of motion. No edema noted.  Labs and Imaging US Ob Transvaginal  Result Date: 12/31/2016 CLINICAL DATA:  26 year old female with passage of placental tissue. LMP: 10/16/2016 corresponding to an estimated gestational age of [redacted] weeks, 6 days. EXAM: TRANSVAGINAL OB ULTRASOUND TECHNIQUE: Transvaginal ultrasound was performed for complete evaluation of the gestation as well as the maternal uterus, adnexal regions, and pelvic cul-de-sac. COMPARISON:  Pelvic ultrasound dated 12/18/2016 FINDINGS: The uterus is anteverted. The endometrium is thickened and heterogeneous measuring 2 cm in thickness. The previously seen intrauterine pregnancy is no longer present on the current ultrasound. High attenuating endometrial content likely represent blood clot. There is areas of increased vascularity primarily in the subendometrial myometrium. Vascularity within the peripheral portion of the endometrium may be present. Retained products of conception is not entirely excluded. Correlation with clinical exam and serial HCG levels and follow-up with ultrasound recommended. The ovaries appear unremarkable. The right ovary measures stop 3.4 x 3.2 x 2.5 cm and the left ovary measures 2.3 x 1.5 x 1.4 cm. No significant free fluid within the pelvis. IMPRESSION: 1. Interval miscarriage of the  previously seen pregnancy. 2. Thickened endometrium likely containing blood products. Retained products of conception is not entirely excluded. Correlation with clinical  exam and MCL HCG levels and follow-up with ultrasound recommended. 3. Unremarkable ovaries. Electronically Signed   By: Elgie Collard M.D.   On: 12/31/2016 01:03   Dg Finger Ring Right  Result Date: 12/27/2016 CLINICAL DATA:  Right ring finger injury playing basketball EXAM: RIGHT RING FINGER 2+V COMPARISON:  03/19/2016 FINDINGS: There is an acute oblique minimally displaced fracture of the right fourth finger middle phalanx distally. Mild soft tissue swelling. No subluxation or dislocation. No joint abnormality. IMPRESSION: Acute minimally displaced fracture right fourth digit middle phalanx. Electronically Signed   By: Judie Petit.  Shick M.D.   On: 12/27/2016 17:39    Assessment & Plan:  1. History of multiple miscarriages Will do workup given SAB x 2. Advised to take PNV plus Folate  daily.  No other concerns.  - Hemoglobin A1c - TSH - Antithrombin III - Protein C activity - Protein C, total - Protein S activity - Protein S, total - Lupus anticoagulant panel - Beta-2-glycoprotein i abs, IgG/M/A - Homocysteine, serum - Factor 5 leiden - Prothrombin gene mutation - Cardiolipin antibodies, IgG, IgM, IgA  2. Questionable Von Willebrand disease (HCC) Question about possible diagnosis in high school, will recheck panel today.  - Von Willebrand panel  Routine preventative health maintenance measures emphasized. Please refer to After Visit Summary for other counseling recommendations.    Total face-to-face time with patient: 15 minutes. Over 50% of encounter was spent on counseling and coordination of care.   Jaynie Collins, MD, FACOG Attending Obstetrician & Gynecologist, [facility] for [employer], [facility]

## 2017-01-18 NOTE — Patient Instructions (Signed)
Return to clinic for any scheduled appointments or for any gynecologic concerns as needed.   

## 2017-01-24 ENCOUNTER — Encounter: Payer: Self-pay | Admitting: Obstetrics & Gynecology

## 2017-01-24 LAB — LUPUS ANTICOAGULANT PANEL
DRVVT: 28.4 s (ref 0.0–47.0)
PTT Lupus Anticoagulant: 33.2 s (ref 0.0–51.9)

## 2017-01-24 LAB — CARDIOLIPIN ANTIBODIES, IGG, IGM, IGA
Anticardiolipin IgA: <9 APL U/mL (ref 0–11)
Anticardiolipin IgG: <9 GPL U/mL (ref 0–14)
Anticardiolipin IgM: <9 MPL U/mL (ref 0–12)

## 2017-01-24 LAB — VON WILLEBRAND PANEL
Factor VIII Activity: 114 % (ref 57–163)
VON WILLEBRAND AG: 115 % (ref 50–200)
Von Willebrand Factor: 79 % (ref 50–200)

## 2017-01-24 LAB — PROTEIN C, TOTAL: PROTEIN C ANTIGEN: 80 % (ref 60–150)

## 2017-01-24 LAB — BETA-2-GLYCOPROTEIN I ABS, IGG/M/A

## 2017-01-24 LAB — PROTEIN S ACTIVITY: Protein S Activity: 115 % (ref 63–140)

## 2017-01-24 LAB — PROTEIN S, TOTAL: Protein S Ag, Total: 62 % (ref 60–150)

## 2017-01-24 LAB — FACTOR 5 LEIDEN

## 2017-01-24 LAB — HEMOGLOBIN A1C
ESTIMATED AVERAGE GLUCOSE: 105 mg/dL
Hgb A1c MFr Bld: 5.3 % (ref 4.8–5.6)

## 2017-01-24 LAB — TSH: TSH: 1.2 u[IU]/mL (ref 0.450–4.500)

## 2017-01-24 LAB — PROTEIN C ACTIVITY: Protein C Activity: 100 % (ref 73–180)

## 2017-01-24 LAB — HOMOCYSTEINE: Homocysteine: 7.3 umol/L (ref 0.0–15.0)

## 2017-01-24 LAB — ANTITHROMBIN III: AntiThromb III Func: 93 % (ref 75–135)

## 2017-01-24 LAB — PROTHROMBIN GENE MUTATION

## 2017-01-24 LAB — COAG STUDIES INTERP REPORT

## 2017-01-31 ENCOUNTER — Ambulatory Visit (INDEPENDENT_AMBULATORY_CARE_PROVIDER_SITE_OTHER): Payer: Medicaid Other

## 2017-01-31 ENCOUNTER — Encounter (INDEPENDENT_AMBULATORY_CARE_PROVIDER_SITE_OTHER): Payer: Self-pay | Admitting: Orthopaedic Surgery

## 2017-01-31 ENCOUNTER — Ambulatory Visit (INDEPENDENT_AMBULATORY_CARE_PROVIDER_SITE_OTHER): Payer: Medicaid Other | Admitting: Orthopaedic Surgery

## 2017-01-31 DIAGNOSIS — S62654A Nondisplaced fracture of medial phalanx of right ring finger, initial encounter for closed fracture: Secondary | ICD-10-CM | POA: Diagnosis not present

## 2017-01-31 NOTE — Progress Notes (Signed)
Patient is 4 weeks that is nondisplaced fracture. She is doing well not complaining of any significant pain. Her range of motion is actually quite good. X-ray show stable appearance of the fracture with bridging callus. At this point I think she can just do her home exercises that she's had in the past. Continue nonweightbearing range of motion exercises. Follow-up in 4 weeks with repeat 2 view x-rays of the right ring finger. Anticipate releasing her at that time.

## 2017-03-12 ENCOUNTER — Ambulatory Visit (INDEPENDENT_AMBULATORY_CARE_PROVIDER_SITE_OTHER): Payer: Medicaid Other

## 2017-03-12 ENCOUNTER — Ambulatory Visit (HOSPITAL_COMMUNITY)
Admission: EM | Admit: 2017-03-12 | Discharge: 2017-03-12 | Disposition: A | Discharge status: Home / Self Care | Payer: Medicaid Other | Attending: Internal Medicine | Admitting: Internal Medicine

## 2017-03-12 ENCOUNTER — Encounter (HOSPITAL_COMMUNITY): Payer: Self-pay | Admitting: Emergency Medicine

## 2017-03-12 DIAGNOSIS — S40012A Contusion of left shoulder, initial encounter: Secondary | ICD-10-CM

## 2017-03-12 MED ORDER — NAPROXEN 500 MG PO TABS: 500.0000 mg | ORAL_TABLET | Freq: Two times a day (BID) | ORAL | 0 refills | Status: DC

## 2017-03-12 NOTE — ED Triage Notes (Signed)
[facility] a [facility] of left shoulder pain and swelling secondary to an injury during a flag football game today.

## 2017-03-12 NOTE — ED Provider Notes (Signed)
CSN: [id number]     Arrival date & time 03/12/17  1909 History   None    Chief Complaint  Patient presents with  . Shoulder Injury   (Consider location/radiation/quality/duration/timing/severity/associated sxs/prior Treatment) Patient presents to [facility] with c/o left shoulder pain due to injury during flag football game.   The history is provided by the patient.  Shoulder Injury  This is a new problem. The problem occurs constantly. The problem has not changed since onset.Nothing aggravates the symptoms.    Past Medical History:  Diagnosis Date  . Anemia   . Anxiety   . Asthma    exercise induced  . Complication of anesthesia   . Head injury, closed    scalp laceration, dizzy for a few minutes  . History of chlamydia   . PONV (postoperative nausea and vomiting)    Past Surgical History:  Procedure Laterality Date  . HAND SURGERY Left 2009   thumb , fracture  . HAND SURGERY Left 2006   Pins placed  . HARDWARE REMOVAL Left 02/10/2016   Procedure: LEFT SMALL FINGER DEEP IMPLANT REMOVAL;  Surgeon: Bradly Bienenstock, MD;  Location: [facility] OR;  Service: Orthopedics;  Laterality: Left;  . OPEN REDUCTION INTERNAL FIXATION (ORIF) PROXIMAL PHALANX Left 01/06/2016   Procedure: OPEN REDUCTION INTERNAL FIXATION (ORIF) LEFT SMALL FINGER;  Surgeon: Bradly Bienenstock, MD;  Location: [facility] OR;  Service: Orthopedics;  Laterality: Left;   Family History  Problem Relation Age of Onset  . Diabetes Father   . Hypertension Father   . Cancer Paternal Grandfather    Social History  Substance Use Topics  . Smoking status: Never Smoker  . Smokeless tobacco: Never Used  . Alcohol use Yes     Comment: on special ocassions, could be a lot but not always   OB History    Gravida Para Term Preterm AB Living   2       2     SAB TAB Ectopic Multiple Live Births   2             Review of Systems  Constitutional: Negative.   HENT: Negative.   Eyes: Negative.   Respiratory: Negative.   Cardiovascular: Negative.    Gastrointestinal: Negative.   Endocrine: Negative.   Genitourinary: Negative.   Musculoskeletal: Positive for arthralgias.  Allergic/Immunologic: Negative.   Neurological: Negative.   Hematological: Negative.   Psychiatric/Behavioral: Negative.     Allergies  Bee venom and Adhesive [tape]  Home Medications   Prior to Admission medications   Medication Sig Start Date End Date Taking? Authorizing Provider  naproxen (NAPROSYN) 500 MG tablet Take 1 tablet (500 mg total) by mouth 2 (two) times daily with a meal. 03/12/17   Mikelle Myrick, Anselm Pancoast, FNP   Meds Ordered and Administered this Visit  Medications - No data to display  BP (!) 106/58 (BP Location: Right Arm)   Pulse 74   Temp 98.4 F (36.9 C) (Oral)   Resp 16   SpO2 98%  No data found.   Physical Exam  Constitutional: She appears well-developed and well-nourished.  HENT:  Head: Normocephalic and atraumatic.  Eyes: Conjunctivae and EOM are normal. Pupils are equal, round, and reactive to light.  Neck: Normal range of motion. Neck supple.  Cardiovascular: Normal rate, regular rhythm and normal heart sounds.   Pulmonary/Chest: Effort normal and breath sounds normal.  Musculoskeletal: She exhibits tenderness.  Tenderness left shoulder with palpation.  Decreased ROM left shoulder.  Nursing note and vitals reviewed.  Urgent Care Course     Procedures (including critical care time)  Labs Review Labs Reviewed - No data to display  Imaging Review Dg Shoulder Left  Result Date: 03/12/2017 CLINICAL DATA:  Flag football injury to the left shoulder with pain and swelling. EXAM: LEFT SHOULDER - 2+ VIEW COMPARISON:  None. FINDINGS: There is no evidence of fracture or dislocation. There is no evidence of arthropathy or other focal bone abnormality. Soft tissues are unremarkable. IMPRESSION: Negative. Electronically Signed   By: Tollie Ethavid  Kwon M.D.   On: 03/12/2017 20:11     Visual Acuity Review  Right Eye Distance:   Left Eye  Distance:   Bilateral Distance:    Right Eye Near:   Left Eye Near:    Bilateral Near:         MDM   1. Contusion of left shoulder, initial encounter    Naprosyn 500mg  one po bid x 10 days #20  Sling left shoulder      Deatra CanterOxford, Tucker Steedley J, FNP 03/12/17 2052

## 2017-04-08 ENCOUNTER — Ambulatory Visit (INDEPENDENT_AMBULATORY_CARE_PROVIDER_SITE_OTHER): Payer: Medicaid Other

## 2017-04-08 ENCOUNTER — Ambulatory Visit (HOSPITAL_COMMUNITY)
Admission: EM | Admit: 2017-04-08 | Discharge: 2017-04-08 | Disposition: A | Discharge status: Home / Self Care | Payer: Medicaid Other | Attending: Family Medicine | Admitting: Family Medicine

## 2017-04-08 ENCOUNTER — Encounter (HOSPITAL_COMMUNITY): Payer: Self-pay | Admitting: *Deleted

## 2017-04-08 DIAGNOSIS — M79641 Pain in right hand: Secondary | ICD-10-CM

## 2017-04-08 DIAGNOSIS — M79645 Pain in left finger(s): Secondary | ICD-10-CM

## 2017-04-08 MED ORDER — IBUPROFEN 800 MG PO TABS
ORAL_TABLET | ORAL | Status: AC
  Filled 2017-04-08: qty 1

## 2017-04-08 MED ORDER — IBUPROFEN 800 MG PO TABS
800.0000 mg | ORAL_TABLET | Freq: Once | ORAL | Status: AC
  Administered 2017-04-08: 800 mg via ORAL

## 2017-04-08 NOTE — ED Triage Notes (Signed)
Pt  Hit  A   Wall   With  Hand  Today    And   Has  Pain  fith  Finger       injured  Her   l  Hall    By  Smacking     A   Wall  Has  Pain  l  Thumb

## 2017-04-08 NOTE — ED Provider Notes (Signed)
CSN: [id number]     Arrival date & time 04/08/17  1916 History   None    Chief Complaint  Patient presents with  . Hand Injury   (Consider location/radiation/quality/duration/timing/severity/associated sxs/prior Treatment) Patient punched her both hand against the closet door out of anger and is now having pain at left thumb and distal right fifth digit finger. Patient said the incident left a dent on the closet door. Pain is moderate. She reports to have full ROM. Patient requesting xray.       Past Medical History:  Diagnosis Date  . Anemia   . Anxiety   . Asthma    exercise induced  . Complication of anesthesia   . Head injury, closed    scalp laceration, dizzy for a few minutes  . History of chlamydia   . PONV (postoperative nausea and vomiting)    Past Surgical History:  Procedure Laterality Date  . HAND SURGERY Left 2009   thumb , fracture  . HAND SURGERY Left 2006   Pins placed  . HARDWARE REMOVAL Left 02/10/2016   Procedure: LEFT SMALL FINGER DEEP IMPLANT REMOVAL;  Surgeon: Bradly Bienenstock, MD;  Location: [facility] OR;  Service: Orthopedics;  Laterality: Left;  . OPEN REDUCTION INTERNAL FIXATION (ORIF) PROXIMAL PHALANX Left 01/06/2016   Procedure: OPEN REDUCTION INTERNAL FIXATION (ORIF) LEFT SMALL FINGER;  Surgeon: Bradly Bienenstock, MD;  Location: [facility] OR;  Service: Orthopedics;  Laterality: Left;   Family History  Problem Relation Age of Onset  . Diabetes Father   . Hypertension Father   . Cancer Paternal Grandfather    Social History  Substance Use Topics  . Smoking status: Never Smoker  . Smokeless tobacco: Never Used  . Alcohol use Yes     Comment: on special ocassions, could be a lot but not always   OB History    Gravida Para Term Preterm AB Living   2       2     SAB TAB Ectopic Multiple Live Births   2             Review of Systems  Constitutional:       See HPI    Allergies  Bee venom and Adhesive [tape]  Home Medications   Prior to Admission  medications   Medication Sig Start Date End Date Taking? Authorizing Provider  albuterol (PROVENTIL HFA;VENTOLIN HFA) 108 (90 Base) MCG/ACT inhaler Inhale into the lungs every 6 (six) hours as needed for wheezing or shortness of breath.   Yes [provider]  naproxen (NAPROSYN) 500 MG tablet Take 1 tablet (500 mg total) by mouth 2 (two) times daily with a meal. 03/12/17   [place], Anselm Pancoast, FNP   Meds Ordered and Administered this Visit   Medications  ibuprofen (ADVIL,MOTRIN) tablet 800 mg (not administered)    BP 128/80 (BP Location: Right Arm)   Pulse 72   Temp 98.6 F (37 C) (Oral)   Resp 18   LMP 03/18/2017 (Within Days)   SpO2 100%  No data found.   Physical Exam  Constitutional: She is oriented to person, place, and time. She appears well-developed and well-nourished.  Cardiovascular: Normal rate, regular rhythm and normal heart sounds.   Pulmonary/Chest: Effort normal and breath sounds normal. She has no wheezes.  Musculoskeletal:  Both hand are symmetrical, no deformity noted, no swelling noted, no bruising noted. Has full ROM of all digits   Neurological: She is alert and oriented to person, place, and time.  Nursing  note and vitals reviewed.   Urgent Care Course     Procedures (including critical care time)  Labs Review Labs Reviewed - No data to display  Imaging Review Dg Hand Complete Right  Result Date: 04/08/2017 CLINICAL DATA:  Blunt injury to the right hand. EXAM: RIGHT HAND - COMPLETE 3+ VIEW COMPARISON:  None. FINDINGS: There is no evidence of fracture or dislocation. There is no evidence of arthropathy or other focal bone abnormality. Probable posttraumatic changes of the fifth mid phalanx. Soft tissues are unremarkable. IMPRESSION: No acute fracture or dislocation identified about the right hand. Electronically Signed   By: Ted Mcalpineobrinka  Dimitrova M.D.   On: 04/08/2017 20:30   Dg Finger Thumb Left  Result Date: 04/08/2017 CLINICAL DATA:  Left  thumb injury. EXAM: LEFT THUMB 2+V COMPARISON:  None. FINDINGS: There is no evidence of fracture or dislocation. There is no evidence of arthropathy or other focal bone abnormality. Soft tissues are unremarkable IMPRESSION: Negative. Electronically Signed   By: Ted Mcalpineobrinka  Dimitrova M.D.   On: 04/08/2017 20:32      MDM   1. Thumb pain, left   2. Right hand pain    Xrays are unremarkable. RICE therapy discussed. F/u with PCP for no improvement.     Lucia EstelleZheng, Markies Mowatt, NP 04/08/17 2049

## 2017-04-24 ENCOUNTER — Encounter (HOSPITAL_COMMUNITY): Payer: Self-pay | Admitting: Emergency Medicine

## 2017-04-24 ENCOUNTER — Emergency Department (HOSPITAL_COMMUNITY)
Admission: EM | Admit: 2017-04-24 | Discharge: 2017-04-24 | Disposition: A | Discharge status: Home / Self Care | Payer: Medicaid Other | Attending: Emergency Medicine | Admitting: Emergency Medicine

## 2017-04-24 DIAGNOSIS — J45909 Unspecified asthma, uncomplicated: Secondary | ICD-10-CM | POA: Insufficient documentation

## 2017-04-24 DIAGNOSIS — Z91030 Bee allergy status: Secondary | ICD-10-CM | POA: Diagnosis not present

## 2017-04-24 DIAGNOSIS — R0789 Other chest pain: Secondary | ICD-10-CM | POA: Diagnosis not present

## 2017-04-24 DIAGNOSIS — T7840XA Allergy, unspecified, initial encounter: Secondary | ICD-10-CM

## 2017-04-24 DIAGNOSIS — T63441A Toxic effect of venom of bees, accidental (unintentional), initial encounter: Secondary | ICD-10-CM | POA: Insufficient documentation

## 2017-04-24 MED ORDER — METHYLPREDNISOLONE SODIUM SUCC 125 MG IJ SOLR
125.0000 mg | Freq: Once | INTRAMUSCULAR | Status: AC
  Administered 2017-04-24: 125 mg via INTRAVENOUS
  Filled 2017-04-24: qty 2

## 2017-04-24 MED ORDER — PREDNISONE 10 MG (21) PO TBPK: ORAL_TABLET | Freq: Every day | ORAL | 0 refills | Status: DC

## 2017-04-24 MED ORDER — SODIUM CHLORIDE 0.9 % IV BOLUS (SEPSIS)
1000.0000 mL | Freq: Once | INTRAVENOUS | Status: AC
  Administered 2017-04-24: 1000 mL via INTRAVENOUS

## 2017-04-24 MED ORDER — DIPHENHYDRAMINE HCL 50 MG/ML IJ SOLN
25.0000 mg | Freq: Once | INTRAMUSCULAR | Status: AC
  Administered 2017-04-24: 25 mg via INTRAVENOUS
  Filled 2017-04-24: qty 1

## 2017-04-24 MED ORDER — FAMOTIDINE IN NACL 20-0.9 MG/50ML-% IV SOLN
20.0000 mg | Freq: Once | INTRAVENOUS | Status: AC
  Administered 2017-04-24: 20 mg via INTRAVENOUS
  Filled 2017-04-24: qty 50

## 2017-04-24 MED ORDER — EPINEPHRINE 0.3 MG/0.3ML IJ SOAJ: 0.3000 mg | Freq: Once | INTRAMUSCULAR | 0 refills | Status: DC | PRN

## 2017-04-24 NOTE — Discharge Instructions (Signed)
Please continue to take 25-50 mg of benadryl every 6-8 hours as for the first 24 hours.

## 2017-04-24 NOTE — ED Provider Notes (Signed)
[facility]-EMERGENCY DEPT Provider Note   CSN: [id number] Arrival date & time: 04/24/17  1905     History   Chief Complaint Chief Complaint  Patient presents with  . Insect Bite    HPI [name] is a 26 y.o. female with a history of anaphylaxis to bee stings presents after being stung by a possible bee to her left arm.  She reports that she did not see the bee, however reports that she saw bees in the area.  She reports a history of delayed reactions to bee stings also, saying that the first time she reacted and her "throat closed up" was about 6 hours after the sting occurred and she was given benadryl. She currently reports that she initially had some chest tightness and "took two pumps" of her albuterol but is currently not having any symptoms.  No N/V. She denies syncope. She reports that her arm hurts, however she does not currently have any other symptoms, is mostly concerned that she will develop them based on her history.  HPI  Past Medical History:  Diagnosis Date  . Anemia   . Anxiety   . Asthma    exercise induced  . Complication of anesthesia   . Head injury, closed    scalp laceration, dizzy for a few minutes  . History of chlamydia   . PONV (postoperative nausea and vomiting)     Patient Active Problem List   Diagnosis Date Noted  . Closed nondisplaced fracture of middle phalanx of right ring finger 01/03/2017  . Von Willebrand disease (HCC) 12/31/2016  . History of multiple miscarriages 12/31/2016    Past Surgical History:  Procedure Laterality Date  . HAND SURGERY Left 2009   thumb , fracture  . HAND SURGERY Left 2006   Pins placed  . HARDWARE REMOVAL Left 02/10/2016   Procedure: LEFT SMALL FINGER DEEP IMPLANT REMOVAL;  Surgeon: Bradly Bienenstock, MD;  Location: [facility] OR;  Service: Orthopedics;  Laterality: Left;  . OPEN REDUCTION INTERNAL FIXATION (ORIF) PROXIMAL PHALANX Left 01/06/2016   Procedure: OPEN REDUCTION INTERNAL FIXATION (ORIF) LEFT SMALL FINGER;   Surgeon: Bradly Bienenstock, MD;  Location: [facility] OR;  Service: Orthopedics;  Laterality: Left;    OB History    Gravida Para Term Preterm AB Living   2       2     SAB TAB Ectopic Multiple Live Births   2               Home Medications    Prior to Admission medications   Medication Sig Start Date End Date Taking? Authorizing Provider  albuterol (PROVENTIL HFA;VENTOLIN HFA) 108 (90 Base) MCG/ACT inhaler Inhale 1-2 puffs into the lungs every 6 (six) hours as needed for wheezing or shortness of breath.    Yes [provider]  naproxen sodium (ANAPROX) 220 MG tablet Take 440 mg by mouth daily as needed (pain).   Yes [provider]  EPINEPHrine 0.3 mg/0.3 mL IJ SOAJ injection Inject 0.3 mLs (0.3 mg total) into the muscle once as needed. As needed for bee stings 04/24/17   Cristina Gong, PA-C  naproxen (NAPROSYN) 500 MG tablet Take 1 tablet (500 mg total) by mouth 2 (two) times daily with a meal. Patient not taking: Reported on 04/24/2017 03/12/17   Deatra Canter, FNP  predniSONE (STERAPRED UNI-PAK 21 TAB) 10 MG (21) TBPK tablet Take by mouth daily. Take 6 tabs by mouth daily  for 2 days, then 5 tabs for 2  days, then 4 tabs for 2 days, then 3 tabs for 2 days, 2 tabs for 2 days, then 1 tab by mouth daily for 2 days 04/24/17   Cristina GongHammond, Darnetta Kesselman W, PA-C    Family History Family History  Problem Relation Age of Onset  . Diabetes Father   . Hypertension Father   . Cancer Paternal Grandfather     Social History Social History  Substance Use Topics  . Smoking status: Never Smoker  . Smokeless tobacco: Never Used  . Alcohol use Yes     Comment: on special ocassions, could be a lot but not always     Allergies   Bee venom and Adhesive [tape]   Review of Systems Review of Systems  Constitutional: Negative for chills and fever.  HENT: Negative for ear pain and sore throat.   Eyes: Negative for pain and visual disturbance.  Respiratory: Positive for chest tightness  (Has resolved). Negative for cough and shortness of breath.   Cardiovascular: Negative for chest pain and palpitations.  Gastrointestinal: Negative for abdominal pain and vomiting.  Genitourinary: Negative for dysuria and hematuria.  Musculoskeletal: Negative for arthralgias and back pain.  Skin: Positive for wound (Left upper posterior arm). Negative for color change and rash.  Neurological: Negative for seizures, syncope, light-headedness and headaches.  All other systems reviewed and are negative.    Physical Exam Updated Vital Signs BP 102/62 (BP Location: Right Arm)   Pulse 62   Temp 99.4 F (37.4 C) (Oral)   Resp 14   Ht 5\' 3"  (1.6 m)   Wt 83.9 kg (185 lb)   LMP 04/10/2017   SpO2 98%   BMI 32.77 kg/m   Physical Exam  Constitutional: She appears well-developed and well-nourished. No distress.  HENT:  Head: Normocephalic and atraumatic.  No edema to tongue, pharynx  Eyes: Conjunctivae are normal.  Neck: Normal range of motion. Neck supple. No tracheal deviation present.  Cardiovascular: Normal rate and regular rhythm.   No murmur heard. Pulmonary/Chest: Effort normal and breath sounds normal. No accessory muscle usage or stridor. No respiratory distress. She has no decreased breath sounds. She has no wheezes. She has no rales.  Abdominal: Soft. There is no tenderness.  Musculoskeletal: She exhibits no edema.  Neurological: She is alert.  Skin: Skin is warm and dry.  5 Cm area of edema over left posterior upper arm.  The edematous area was marked with a skin marker to monitor progression.  Psychiatric: She has a normal mood and affect. Her behavior is normal.  Nursing note and vitals reviewed.    ED Treatments / Results  Labs (all labs ordered are listed, but only abnormal results are displayed) Labs Reviewed - No data to display  EKG  EKG Interpretation None       Radiology No results found.  Procedures Procedures (including critical care  time)  Medications Ordered in ED Medications  diphenhydrAMINE (BENADRYL) injection 25 mg (25 mg Intravenous Given 04/24/17 2059)  famotidine (PEPCID) IVPB 20 mg premix (0 mg Intravenous Stopped 04/24/17 2205)  methylPREDNISolone sodium succinate (SOLU-MEDROL) 125 mg/2 mL injection 125 mg (125 mg Intravenous Given 04/24/17 2059)  sodium chloride 0.9 % bolus 1,000 mL (0 mLs Intravenous Stopped 04/24/17 2331)     Initial Impression / Assessment and Plan / ED Course  I have reviewed the triage vital signs and the nursing notes.  Pertinent labs & imaging results that were available during my care of the patient were reviewed by me and considered  in my medical decision making (see chart for details).  Clinical Course as of Apr 25 248  Mon Apr 24, 2017  2111 Patient checked, in no distress at this time.  Swollen area has obviously decreased since initial Marcaine.  [EH]  2321 Patient checked.  Lungs CTAB, denies wheezing, tongue or throat swelling. States is ready for discharge.   [EH]    Clinical Course User Index [EH] Cristina Gong, PA-C   Patient re-evaluated prior to dc, is hemodynamically stable, in no respiratory distress, and denies the feeling of throat closing. Patient has been observed for multiple hours in the ED without any obvious complications. Patient allergic reaction was treated in the ED with Benadryl, Solu-Medrol, Pepcid, and fluids. Pt has been advised to take OTC benadryl & return to the ED if they have a mod-severe allergic rxn (s/s including throat closing, difficulty breathing, swelling of lips face or tongue). Pt is to follow up with their PCP. Patient given Rx for steroid taper, and EpiPen and she states hers are all expired. Pt is agreeable with plan & verbalizes understanding.    Final Clinical Impressions(s) / ED Diagnoses   Final diagnoses:  Allergic reaction, initial encounter  Bee sting, accidental or unintentional, initial encounter    New  Prescriptions Discharge Medication List as of 04/24/2017 11:26 PM    START taking these medications   Details  EPINEPHrine 0.3 mg/0.3 mL IJ SOAJ injection Inject 0.3 mLs (0.3 mg total) into the muscle once as needed. As needed for bee stings, Starting Mon 04/24/2017, Print    predniSONE (STERAPRED UNI-PAK 21 TAB) 10 MG (21) TBPK tablet Take by mouth daily. Take 6 tabs by mouth daily  for 2 days, then 5 tabs for 2 days, then 4 tabs for 2 days, then 3 tabs for 2 days, 2 tabs for 2 days, then 1 tab by mouth daily for 2 days, Starting Mon 04/24/2017, Print         Cristina Gong, PA-C 04/25/17 0249    Pricilla Loveless, MD 04/25/17 [phone]

## 2017-04-24 NOTE — ED Notes (Signed)
Pt from home with c/o of left upper arm pain following an insect sting at the pool today. Pt rates pain 5/10. Pt denies SOB and has clear lung sounds. Pt states she thinks it may have been a bee, but she did not see anything sting her. Pt has a baseball sized area of redness on her arm.

## 2017-07-19 ENCOUNTER — Ambulatory Visit: Payer: Self-pay | Admitting: Obstetrics & Gynecology

## 2017-10-10 NOTE — L&D Delivery Note (Addendum)
Patient: Victoria CulverRachael Nguyen MRN: [id number]  GBS status: negative  Patient is a 27 y.o. now [id number] s/p NSVD at 2832w0d, who was admitted for active labor. SROM 6h 4018m prior to delivery with clear fluid.    Delivery Note At 10:34 AM a viable and healthy female was delivered via Vaginal, Spontaneous (Presentation: ROP ;  ).  APGAR: 9, 9; weight pending .   Placenta status: intact, spontaneous, .  Cord: 3 vessel cord   Anesthesia:  Epidural  Episiotomy: None Lacerations: 2nd degree Suture Repair: 2.0 monocryl  Est. Blood Loss (mL):  370  Mom to postpartum.  Baby to nursery due to mothers flu + status.   Sigurd SosStephenia M Dewaun Kinzler 09/15/2018, 11:36 AM     Head delivered ROP. No nuchal cord present. Shoulder and body delivered in usual fashion. Infant with spontaneous cry, placed on mother's abdomen, dried and bulb suctioned. Cord clamped x 2 after 1-minute delay, and cut by family member. Cord blood drawn. Placenta delivered spontaneously with gentle cord traction. Fundus firm with massage and Pitocin. Perineum inspected and found to have 2nd degree laceration, which was found to be which was repaired with 2-0 monocryl with good hemostasis achieved.

## 2017-12-11 IMAGING — DX DG FINGER LITTLE 2+V*R*
3 series · 3 of 3 positions shown · non-contrast
Comparison: Earlier radiograph dated 03/19/2016

CLINICAL DATA: 24-year-old female status post reduction of right
fifth digit.

EXAM:
RIGHT LITTLE FINGER 2+V

[finger ap]
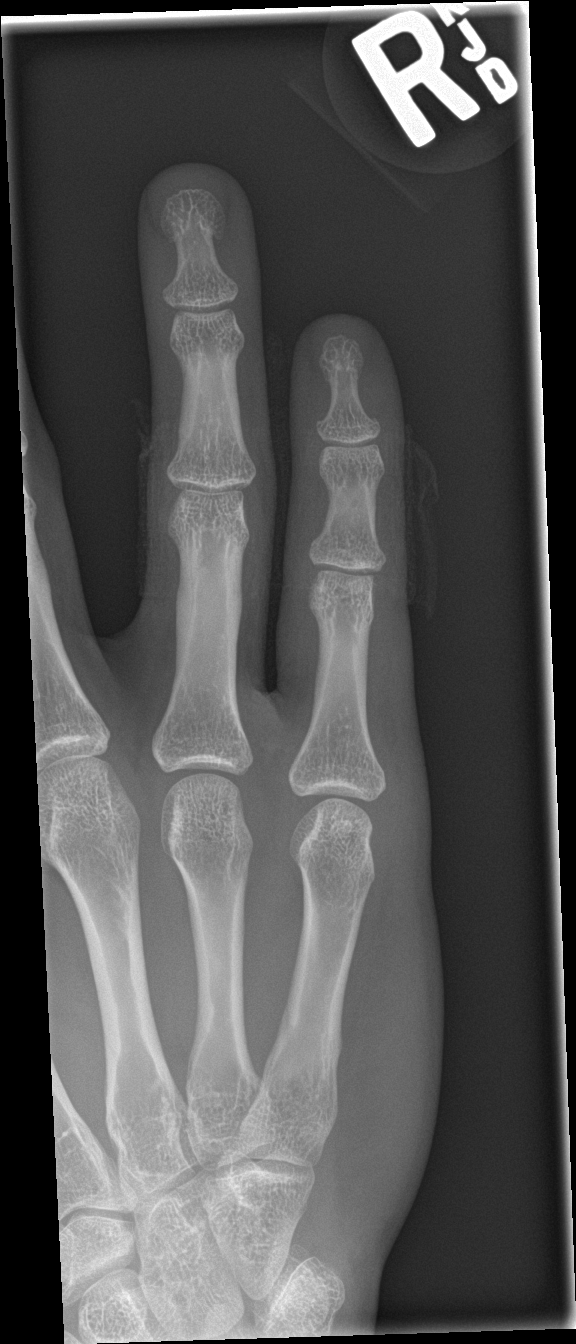

[finger obl]
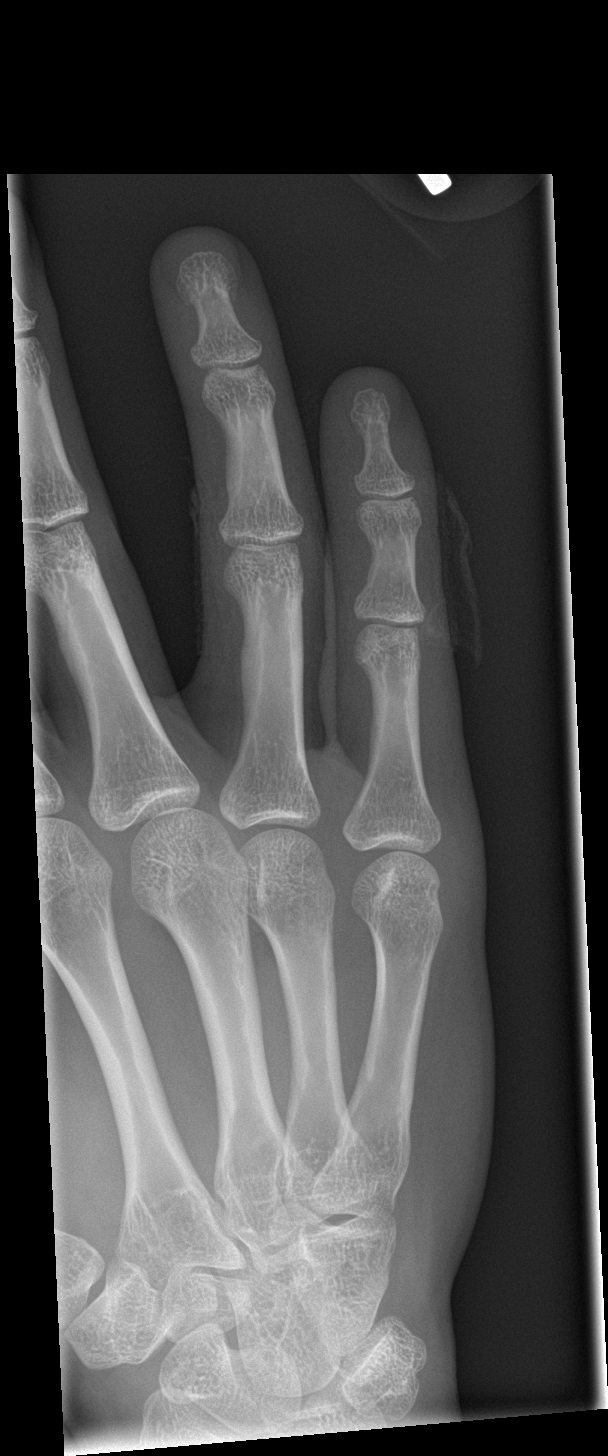

[finger lat]
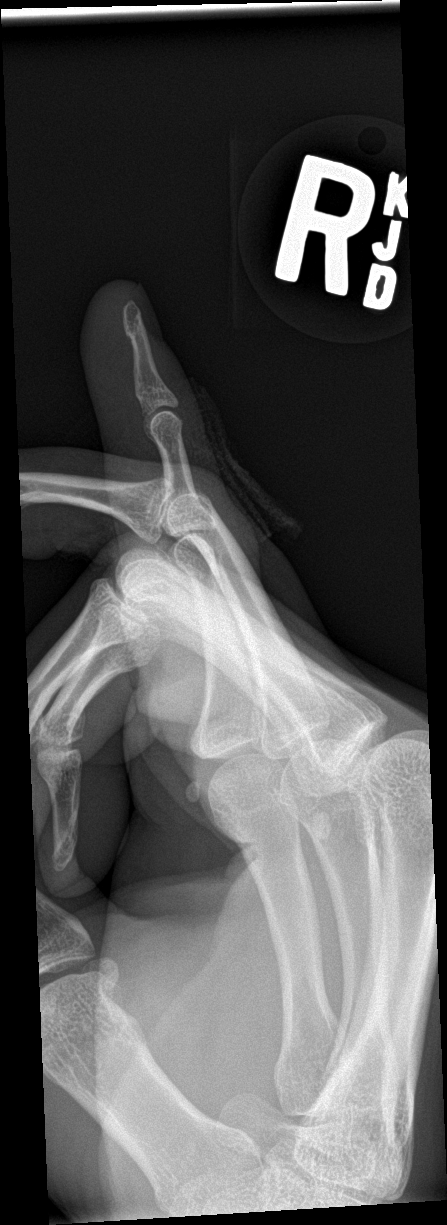

[3 of 3 positions shown; findings below may reference images not displayed]

FINDINGS: There has been interval reduction of the previously seen dislocated
PIP of the fifth digit, now in anatomic alignment. No fracture or
dislocation identified on this radiograph. The bones are well
mineralized. The soft tissue swelling of the fifth digit.
IMPRESSION: Interval reduction of the previously seen dislocated PIP of the
fifth digit.

## 2018-01-01 ENCOUNTER — Ambulatory Visit: Payer: Medicaid Other | Admitting: Obstetrics & Gynecology

## 2018-01-15 ENCOUNTER — Ambulatory Visit (INDEPENDENT_AMBULATORY_CARE_PROVIDER_SITE_OTHER): Payer: Medicaid Other | Admitting: Medical

## 2018-01-15 ENCOUNTER — Encounter: Payer: Self-pay | Admitting: Medical

## 2018-01-15 VITALS — BP 104/60 | HR 72 | Ht 63.0 in | Wt 194.6 lb

## 2018-01-15 DIAGNOSIS — N939 Abnormal uterine and vaginal bleeding, unspecified: Secondary | ICD-10-CM

## 2018-01-15 NOTE — Progress Notes (Signed)
Pt states she had irregular period in January and also last month - heavy, passed clots and "gray" tissue.

## 2018-01-15 NOTE — Progress Notes (Signed)
  History:  Ms. [name] is a 27 y.o. G2P0020 who presents to clinic today for management of an abnormal bleeding episode. The patient has been seen here before for work-up after 2 SABs. She states that her period in March although on time was heavier than normal. She states that her periods are always heavy and painful, but this was worse than usual. She also noted "grey tissue" in the blood clots. She did not take a UPT at that time since it was on time for her normal period, but is concerned this may have been another miscarriage. She is sexually active and does not use protection. She denies actively trying to conceive, but is ok with conception if it were to occur. She denies any weakness or dizziness following heavy bleeding. She has not yet had a period for April and is 3 days late. She has not taken a HPT recently.   The following portions of the patient's history were reviewed and updated as appropriate: allergies, current medications, family history, past medical history, social history, past surgical history and problem list.  Review of Systems:  Review of Systems  Constitutional: Negative for fever and malaise/fatigue.  Gastrointestinal: Negative for abdominal pain.  Genitourinary:       - Neg - bleeding  Neurological: Negative for dizziness.      Objective:  Physical Exam BP 104/60 (BP Location: Left Arm, Patient Position: Sitting, Cuff Size: Large)   Pulse 72   Ht 5\' 3"  (1.6 m)   Wt 194 lb 9.6 oz (88.3 kg)   LMP 12/16/2017 (Exact Date)   BMI 34.47 kg/m  Physical Exam  Constitutional: She is oriented to person, place, and time. She appears well-developed and well-nourished. No distress.  HENT:  Head: Normocephalic.  Cardiovascular: Normal rate.  Pulmonary/Chest: Effort normal.  Abdominal: Soft.  Neurological: She is alert and oriented to person, place, and time.  Skin: Skin is warm and dry. No erythema.  Psychiatric: She has a normal mood and affect.  Vitals  reviewed.   Labs and Imaging UPT positive today   Assessment & Plan:  1. Abnormal uterine bleeding (AUB) - + UPT  - B-HCG Quant - CBC - Unclear if this is a new pregnancy or residual hormone from a possible miscarriage in March. Will bring patient back in 48 hours to repeat hCG to determine pregnancy status. No abdominal pain or vaginal bleeding today to warrant MAU visit today.    [name], Victoria Koning N, PA-C 01/15/2018 6:03 PM

## 2018-01-15 NOTE — Patient Instructions (Signed)

## 2018-01-16 ENCOUNTER — Other Ambulatory Visit: Payer: Self-pay | Admitting: *Deleted

## 2018-01-16 DIAGNOSIS — N96 Recurrent pregnancy loss: Secondary | ICD-10-CM

## 2018-01-16 LAB — CBC
HEMATOCRIT: 36.1 % (ref 34.0–46.6)
Hemoglobin: 12.1 g/dL (ref 11.1–15.9)
MCH: 28.5 pg (ref 26.6–33.0)
MCHC: 33.5 g/dL (ref 31.5–35.7)
MCV: 85 fL (ref 79–97)
PLATELETS: 473 10*3/uL — AB (ref 150–379)
RBC: 4.25 x10E6/uL (ref 3.77–5.28)
RDW: 13.4 % (ref 12.3–15.4)
WBC: 11.3 10*3/uL — AB (ref 3.4–10.8)

## 2018-01-16 LAB — BETA HCG QUANT (REF LAB): hCG Quant: 2775 m[IU]/mL

## 2018-01-16 LAB — POCT PREGNANCY, URINE: Preg Test, Ur: POSITIVE — AB

## 2018-01-17 ENCOUNTER — Other Ambulatory Visit: Payer: Self-pay

## 2018-01-17 ENCOUNTER — Encounter: Payer: Self-pay | Admitting: Medical

## 2018-01-17 DIAGNOSIS — N96 Recurrent pregnancy loss: Secondary | ICD-10-CM

## 2018-01-18 LAB — BETA HCG QUANT (REF LAB): hCG Quant: 5614 m[IU]/mL

## 2018-01-22 ENCOUNTER — Telehealth: Payer: Self-pay | Admitting: Medical

## 2018-01-22 NOTE — Telephone Encounter (Signed)
Patient called to say she was suppose to get scheduled for an ultrasound. Requesting a call back from RN.

## 2018-01-23 ENCOUNTER — Other Ambulatory Visit: Payer: Self-pay | Admitting: General Practice

## 2018-01-23 DIAGNOSIS — Z3491 Encounter for supervision of normal pregnancy, unspecified, first trimester: Secondary | ICD-10-CM

## 2018-01-23 NOTE — Telephone Encounter (Signed)
Scheduled ultrasound for 4/22 @ 8am per Vonzella NippleJulie Wenzel. Called & informed patient. Patient verbalized understanding to all & had no questions

## 2018-01-29 ENCOUNTER — Ambulatory Visit (INDEPENDENT_AMBULATORY_CARE_PROVIDER_SITE_OTHER): Payer: Self-pay | Admitting: *Deleted

## 2018-01-29 ENCOUNTER — Encounter: Payer: Self-pay | Admitting: General Practice

## 2018-01-29 ENCOUNTER — Ambulatory Visit (HOSPITAL_COMMUNITY)
Admission: RE | Admit: 2018-01-29 | Discharge: 2018-01-29 | Disposition: A | Discharge status: Home / Self Care | Payer: Medicaid Other | Source: Ambulatory Visit | Attending: Medical | Admitting: Medical

## 2018-01-29 DIAGNOSIS — Z3491 Encounter for supervision of normal pregnancy, unspecified, first trimester: Secondary | ICD-10-CM | POA: Diagnosis not present

## 2018-01-29 DIAGNOSIS — Z712 Person consulting for explanation of examination or test findings: Secondary | ICD-10-CM

## 2018-01-29 NOTE — Progress Notes (Signed)
US results reviewed by Thressa ShellerHeather Hogan, CNM. Pt informed of viable pregnancy. She reports LMP 12/18/17.  Medication reconciliation completed. She will schedule prenatal care @ the office of her choice.

## 2018-01-29 NOTE — Progress Notes (Signed)
I have reviewed the nurse's note, and agree with the plan of care.  Thressa ShellerHeather Hogan 1:05 PM 01/29/18

## 2018-02-02 ENCOUNTER — Encounter: Payer: Self-pay | Admitting: Medical

## 2018-02-12 ENCOUNTER — Encounter: Payer: Self-pay | Admitting: Medical

## 2018-02-25 ENCOUNTER — Encounter: Payer: Self-pay | Admitting: Advanced Practice Midwife

## 2018-03-12 ENCOUNTER — Ambulatory Visit (INDEPENDENT_AMBULATORY_CARE_PROVIDER_SITE_OTHER): Payer: Medicaid Other | Admitting: Advanced Practice Midwife

## 2018-03-12 ENCOUNTER — Encounter: Payer: Self-pay | Admitting: Advanced Practice Midwife

## 2018-03-12 ENCOUNTER — Other Ambulatory Visit (HOSPITAL_COMMUNITY)
Admission: RE | Admit: 2018-03-12 | Discharge: 2018-03-12 | Disposition: A | Discharge status: Home / Self Care | Payer: Medicaid Other | Source: Ambulatory Visit | Attending: Advanced Practice Midwife | Admitting: Advanced Practice Midwife

## 2018-03-12 ENCOUNTER — Other Ambulatory Visit: Payer: Self-pay

## 2018-03-12 ENCOUNTER — Encounter: Payer: Self-pay | Admitting: *Deleted

## 2018-03-12 VITALS — BP 121/68 | HR 89 | Wt 202.0 lb

## 2018-03-12 DIAGNOSIS — Z3A12 12 weeks gestation of pregnancy: Secondary | ICD-10-CM | POA: Insufficient documentation

## 2018-03-12 DIAGNOSIS — D6800 Von Willebrand disease, unspecified: Secondary | ICD-10-CM

## 2018-03-12 DIAGNOSIS — O99511 Diseases of the respiratory system complicating pregnancy, first trimester: Secondary | ICD-10-CM | POA: Insufficient documentation

## 2018-03-12 DIAGNOSIS — Z3480 Encounter for supervision of other normal pregnancy, unspecified trimester: Secondary | ICD-10-CM | POA: Insufficient documentation

## 2018-03-12 DIAGNOSIS — O99111 Other diseases of the blood and blood-forming organs and certain disorders involving the immune mechanism complicating pregnancy, first trimester: Secondary | ICD-10-CM | POA: Diagnosis present

## 2018-03-12 DIAGNOSIS — Z3481 Encounter for supervision of other normal pregnancy, first trimester: Secondary | ICD-10-CM

## 2018-03-12 DIAGNOSIS — J45909 Unspecified asthma, uncomplicated: Secondary | ICD-10-CM | POA: Diagnosis not present

## 2018-03-12 LAB — POCT URINALYSIS DIP (DEVICE)
Bilirubin Urine: NEGATIVE
Bilirubin Urine: NEGATIVE
GLUCOSE, UA: NEGATIVE mg/dL
Glucose, UA: NEGATIVE mg/dL
HGB URINE DIPSTICK: NEGATIVE
Hgb urine dipstick: NEGATIVE
Ketones, ur: NEGATIVE mg/dL
Ketones, ur: NEGATIVE mg/dL
Leukocytes, UA: NEGATIVE
Leukocytes, UA: NEGATIVE
NITRITE: NEGATIVE
Nitrite: NEGATIVE
PH: 6 (ref 5.0–8.0)
PROTEIN: NEGATIVE mg/dL
PROTEIN: NEGATIVE mg/dL
SPECIFIC GRAVITY, URINE: 1.01 (ref 1.005–1.030)
Specific Gravity, Urine: 1.01 (ref 1.005–1.030)
UROBILINOGEN UA: 0.2 mg/dL (ref 0.0–1.0)
Urobilinogen, UA: 0.2 mg/dL (ref 0.0–1.0)
pH: 5.5 (ref 5.0–8.0)

## 2018-03-12 NOTE — Progress Notes (Signed)
Addendum: PMH form completed. 

## 2018-03-12 NOTE — Progress Notes (Signed)
Subjective:   [name] is a 27 y.o. G3P0020 at [redacted]w[redacted]d by LMP, early ultrasound being seen today for her first obstetrical visit.  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 Patient does intend to breast feed. Pregnancy history fully reviewed.  Patient reports constipation .  Patient dx with Von Willebrand in 2010 during high school. She does not remember the type. States that Peds referred to hematology at that time. She reports that at the time she had a lot of bruising and heavy periods.   She reports that she still has trouble with easy bruising.   HISTORY: OB History  Gravida Para Term Preterm AB Living  3 0 0 0 2 0  SAB TAB Ectopic Multiple Live Births  2 0 0 0 0    # Outcome Date GA Lbr Len/2nd Weight Sex Delivery Anes PTL Lv  3 Current           2 SAB 12/18/16 [redacted]w[redacted]d    SAB     1 SAB 03/28/16 [redacted]w[redacted]d    SAB       Last pap smear was done 12/11/017 and was normal  Past Medical History:  Diagnosis Date  . Anemia   . Anxiety   . Asthma    exercise induced  . Complication of anesthesia   . Head injury, closed    scalp laceration, dizzy for a few minutes  . History of chlamydia   . PONV (postoperative nausea and vomiting)    Past Surgical History:  Procedure Laterality Date  . HAND SURGERY Left 2009   thumb , fracture  . HAND SURGERY Left 2006   Pins placed  . HARDWARE REMOVAL Left 02/10/2016   Procedure: LEFT SMALL FINGER DEEP IMPLANT REMOVAL;  Surgeon: Bradly Bienenstock, MD;  Location: [facility] OR;  Service: Orthopedics;  Laterality: Left;  . OPEN REDUCTION INTERNAL FIXATION (ORIF) PROXIMAL PHALANX Left 01/06/2016   Procedure: OPEN REDUCTION INTERNAL FIXATION (ORIF) LEFT SMALL FINGER;  Surgeon: Bradly Bienenstock, MD;  Location: [facility] OR;  Service: Orthopedics;  Laterality: Left;   Family History  Problem Relation Age of Onset  . Diabetes Father   . Hypertension Father   . Cancer Paternal Grandfather    Social History   Tobacco Use  . Smoking status: Never Smoker   . Smokeless tobacco: Never Used  Substance Use Topics  . Alcohol use: Yes    Comment: on special ocassions, could be a lot but not always  . Drug use: No   Allergies  Allergen Reactions  . Bee Venom Anaphylaxis  . Adhesive [Tape] Itching and Rash   Current Outpatient Medications on File Prior to Visit  Medication Sig Dispense Refill  . acetaminophen (TYLENOL) 325 MG tablet Take 650 mg by mouth as needed.    Marland Kitchen albuterol (PROVENTIL HFA;VENTOLIN HFA) 108 (90 Base) MCG/ACT inhaler Inhale 1-2 puffs into the lungs every 6 (six) hours as needed for wheezing or shortness of breath.     . EPINEPHrine 0.3 mg/0.3 mL IJ SOAJ injection Inject 0.3 mLs (0.3 mg total) into the muscle once as needed. As needed for bee stings 2 Device 0  . Prenatal Vit-Fe Fumarate-FA (MULTIVITAMIN-PRENATAL) 27-0.8 MG TABS tablet Take 1 tablet by mouth daily at 12 noon.     No current facility-administered medications on file prior to visit.     Review of Systems Pertinent items noted in HPI and remainder of comprehensive ROS otherwise negative.  Exam   Vitals:   03/12/18 0829  BP: 121/68  Pulse: 89  Weight: 202 lb (91.6 kg)      Uterus:   12 weeks size, +FHT with doppler 153 BPM   Pelvic Exam: Perineum: no hemorrhoids, normal perineum   Vulva: normal external genitalia, no lesions   Vagina:  normal mucosa, normal discharge   Cervix: Closed/thick, no CMT    Adnexa: normal adnexa and no mass, fullness, tenderness   Bony Pelvis: average  System: General: well-developed, well-nourished female in no acute distress   Breast:  normal appearance, no masses or tenderness   Skin: normal coloration and turgor, no rashes, mole at the center of the sternum. Patient states that it has been there for "years" without any changes    Neurologic: oriented, normal, negative, normal mood   Extremities: normal strength, tone, and muscle mass, ROM of all joints is normal   HEENT PERRLA, extraocular movement intact and sclera  clear, anicteric   Mouth/Teeth mucous membranes moist, pharynx normal without lesions and dental hygiene good   Neck supple and no masses   Cardiovascular: regular rate and rhythm   Respiratory:  no respiratory distress, normal breath sounds   Abdomen: soft, non-tender; bowel sounds normal; no masses,  no organomegaly    Assessment:   Pregnancy: [id number] Patient Active Problem List   Diagnosis Date Noted  . Supervision of other normal pregnancy, antepartum 03/12/2018  . Closed nondisplaced fracture of middle phalanx of right ring finger 01/03/2017  . Von Willebrand disease (HCC) 12/31/2016  . History of multiple miscarriages 12/31/2016     Plan:  1. Supervision of other normal pregnancy, antepartum - Culture, OB Urine - Genetic Screening - SMN1 COPY NUMBER ANALYSIS (SMA Carrier Screen) - US MFM OB COMP + 14 WK; Future - Obstetric Panel, Including HIV - GC/Chlamydia probe amp ([place] - Hemoglobinopathy Evaluation - [facility] AMB BABYSCRIPTS OPT IN - Hgb A1C  2. Von Willebrand disease (HCC) - Ambulatory referral to Hematology - Von Willebrand panel - INR/PT - PTT   Initial labs drawn. Continue prenatal vitamins. Genetic Screening discussed, First trimester screen, Quad screen and NIPS: request panorama today. Will consider AFP at the proper timing. Ultrasound discussed; fetal anatomic survey: ordered. Problem list reviewed and updated. The nature [facility] with multiple MDs and other Advanced Practice Providers was explained to patient; also emphasized that residents, students are part of our team. Routine obstetric precautions reviewed. 50% of 45 min visit spent in counseling and coordination of care. No follow-ups on file.

## 2018-03-12 NOTE — Patient Instructions (Addendum)
Safe Medications in Pregnancy   Acne: Benzoyl Peroxide Salicylic Acid  Backache/Headache: Tylenol: 2 regular strength every 4 hours OR              2 Extra strength every 6 hours  Colds/Coughs/Allergies: Benadryl (alcohol free) 25 mg every 6 hours as needed Breath right strips Claritin Cepacol throat lozenges Chloraseptic throat spray Cold-Eeze- up to three times per day Cough drops, alcohol free Flonase (by prescription only) Guaifenesin Mucinex Robitussin DM (plain only, alcohol free) Saline nasal spray/drops Sudafed (pseudoephedrine) & Actifed ** use only after [redacted] weeks gestation and if you do not have high blood pressure Tylenol Vicks Vaporub Zinc lozenges Zyrtec   Constipation: 3. Colace Ducolax suppositories Fleet enema Glycerin suppositories 1. Metamucil Milk of magnesia 3. Miralax2017 Senokot Smooth move tea  Diarrhea: Kaopectate Imodium A-D  *NO pepto Bismol  Hemorrhoids: Anusol Anusol HC Preparation H Tucks  Indigestion: Tums Maalox Mylanta Zantac  Pepcid  Insomnia: Benadryl (alcohol free) 11m every 6 hours as needed Tylenol PM Unisom, no Gelcaps  Leg Cramps: Tums MagGel  Nausea/Vomiting:  Bonine Dramamine Emetrol Ginger extract Sea bands Meclizine  Nausea medication to take during pregnancy:  Unisom (doxylamine succinate 25 mg tablets) Take one tablet daily at bedtime. If symptoms are not adequately controlled, the dose can be increased to a maximum recommended dose of two tablets daily (1/2 tablet in the morning, 1/2 tablet mid-afternoon and one at bedtime). Vitamin B6 1080mtablets. Take one tablet twice a day (up to 200 mg per day).  Skin Rashes: Aveeno products Benadryl cream or 2530mvery 6 hours as needed Calamine Lotion 1% cortisone cream  Yeast infection: Gyne-lotrimin 7 Monistat 7   **If taking multiple medications, please check labels to avoid duplicating the same active ingredients **take medication as  directed on the label ** Do not exceed 4000 mg of tylenol in 24 hours **Do not take medications that contain aspirin or ibuprofen    Screening Tests for Genetic Problems and Birth Defects  Screening tests separate those pregnant women whose baby might have certain conditions from those who probably don't have the birth defect being tested for. There are no physical risks to you or your baby from having any of the screening tests (other than minor pain associated with having a blood sample taken). Serum screens are blood tests. These tests tell you if there is a higher chance your baby has a defect in the spine or brain, or Down syndrome. A high or low result on this test does not mean your baby has a problem for sure. These results only identify which women should have diagnostic tests to find out if something is wrong. There are several different kinds of serum screens. Depending on the test, they are done between 10 and 20 weeks of pregnancy. Ultrasound is a way to look at your baby inside your uterus (womb) using sound waves that make an image of the baby on a monitor. Ultrasound can pick up certain problems depending on when in pregnancy it is done. An ultrasound done at 16 to 20 weeks of pregnancy shows your baby's heart, brain, and other organs. Most women are offered an ultrasound at this time in their pregnancies. Sometimes ultrasound can miss problems.   Name of the test and what it is When in pregnancy the test is done What the test tells you What happens if the test is abnormal  First-trimester screen blood test and nuchal translucency (NT) ultrasound. 11-14 weeks Detects 8-9 of 10  babies with Down syndrome. Genetic counseling is offered to review your results in depth, and discuss further testing options. Further testing could include CVS or amniocentesis.   Quad Screen - a single blood test 15-20 weeks Detects 8 of 10 babies with Down syndrome. Can also detect neural tube defects like  spina bifida.  Genetic counseling is offered to review your results in depth, and discuss further testing options. Further testing could include amniocentesis   Panorama - a single blood test Any time after 10 weeks Detects 9 of 10 babies with Down syndrome (trisomy 21) or trisomy 64 or 13. This test can also tell you the sex of your baby.  Genetic counseling is offered to review your results in depth, and discuss further testing options. Further testing could include CVS or amniocentesis.  AFP only - a single blood test 14-21 weeks This test can be done to look for neural tube defects (like spina bifida). It is ONLY done if you have already had a FIRST Screen or Panorama test Detects 7- 9 of 10 babies with neural tube defects.  Genetic counseling is offered to review your results in depth, and discuss further testing options.  Targeted ultrasound will be offered.    How do I decide? Some important questions to ask when making decisions about these tests are: . What information will the test give me? . How accurate is this test? . What risks are there for my baby and for me if I have this test? . What would I do with the information from the test? . Would I do anything different if the test results are abnormal? . Would I agree to more tests to find out if something is really wrong with my baby?  AREA PEDIATRIC/FAMILY [place] [address], [address] [place], [place]  [address] Phone - [phone]   Fax - [phone]  [facility] [address] [place], [place] [address] Phone - [phone]   Fax - [place] [address] [place], [place]  [address] Phone - [phone]   Fax - [phone]  [facility] [place]. [address] [place] [address] [place], [place]  [address] Phone - [phone]   Fax - [phone]  [place] [address] [place], [place]  [address] Phone -  [phone]   Fax - [phone]  [facility] [address] [place], [place]  [address] Phone - [phone]   Fax - [place] [address] [place], [place]  [address] Phone - [phone]   Fax - [phone]  [place] [address] [place], [place] [address] [place], [place]  [address] Phone - [phone]   Fax - [place] [address] [place], [place]  [address] Phone - [phone]   Fax - [phone] [facility] [place]. [address] [place], [place]  [address] Phone - [phone]   Fax - [phone]  [place] [address] [place], [place]  [address] Phone - [phone]   Fax - [phone]  [facility] AT [place], [place], [place]  [address] Phone - [phone]   Fax - [place] [address], [place], [place]  [address] Phone - [phone]   Fax - [phone]  [facility] [address]  [address] [place], [place]  [address] Phone - [address] [place], [place]  [address] Phone - [phone]   Fax - [place] [address], [place], [place]  [address] Phone - [phone]   Fax - [phone]  [place] [address] [place], [place]  [address] Phone - [phone]   Fax - [place]. [place], [place]  [address] Phone - [phone]   Fax - [place], [place], [place]  [address] Phone - [phone]   Fax - [place] [address] [place], [place]  [address] Phone - [phone]   Fax - [phone]  [facility]UB[facility] [address], [place], [place]  [address] Phone - [phone]   Fax - [place] [address] [place], [place]  [address] Phone - [phone]   Fax - [phone]  [place] [address] [place], [place]  [address] Phone - [phone]   Fax - [phone] [place] [place] [address] [place], [place]  [address] Phone - [phone]   Fax - [place] [address] [place],   [address] Phone - [phone]   Fax - [phone]  [place] [address], [place],   [address] Phone - [phone]   Fax - [phone]  [place] [address] [place] [address] Phone [phone]  Fax [phone]  Childbirth Education Options: [facility] Department Classes:  Childbirth education classes can help you get ready for a positive parenting experience. You can also meet other expectant parents and get free stuff for your baby. Each class runs for five weeks on the same night and costs $45 for the mother-to-be and her support person. Medicaid covers the cost if you are eligible. Call [phone] to register. [facility] Childbirth Education:  [phone] or [phone] or [email or web address]  Baby & Me Class: Discuss newborn & infant parenting and family adjustment issues with other new mothers in a relaxed environment. Each week brings a new speaker or baby-centered activity. We encourage new mothers to join [place] every Thursday at 11:00am. Babies birth until crawling. No registration or fee. Daddy [employer]: This course offers Dads-to-be the tools and knowledge needed to feel confident on their journey to becoming new fathers. Experienced dads, who have been trained as coaches, teach dads-to-be how to hold, comfort, diaper, swaddle and play with their  infant while being able to support the new mom as well. A class for men taught by men. $25/dad Big Brother/Big Sister: Let your children share in the joy of a new brother or sister in this special class designed just for them. Class includes discussion about how families care for babies: swaddling, holding, diapering, safety as well as how they can be helpful in their new role. This class is designed for children ages 17 to 69, but any age is welcome. Please register each child individually. $5/child  Mom Talk: This mom-led group offers support and connection to mothers as they journey through the adjustments and struggles of that sometimes overwhelming first year after the birth of a child. Tuesdays at 10:00am and Thursdays at 6:00pm. Babies welcome. No registration or fee. Breastfeeding Support Group: This group is a Museum/gallery curator support  circle where moms have the opportunity to share their breastfeeding experiences. A Lactation Consultant is present for questions and concerns. Meets each Tuesday at 11:00am. No fee or registration. Breastfeeding Your Baby: Learn what to expect in the first days of breastfeeding your newborn.  This class will help you feel more confident with the skills needed to begin your breastfeeding experience. Many new mothers are concerned about breastfeeding after leaving the hospital. This class will also address the most common fears and challenges about breastfeeding during the first few weeks, months and beyond. (call for fee) Comfort Techniques and Tour: This 2 hour interactive class will provide you the opportunity to learn & practice hands-on techniques that can help relieve some of the discomfort of labor and encourage your baby to rotate toward the best position for birth. You and your partner will be able to try a variety of labor positions with birth balls and rebozos as well as practice breathing, relaxation, and visualization techniques. A tour of the [facility] is included with this class. $20 per registrant and support person Childbirth Class- Weekend Option: This class is a Weekend version of our Birth & Baby series. It is designed for parents who have a difficult time fitting several weeks of classes into their schedule. It covers the care of your newborn and the basics of labor and childbirth. It also includes a [place] [facility] and lunch. The class is held two consecutive days: beginning on Friday evening from 6:30 - 8:30 p.m. and the next day, Saturday from 9 a.m. - 4 p.m. (call for fee) Doren Custard Class: Interested in a waterbirth?  This informational class will help you discover whether waterbirth is the right fit for you. Education about waterbirth itself, supplies you would need and how to assemble your support team is what you can expect from this class. Some obstetrical practices require this class in order to pursue a waterbirth. (Not all obstetrical practices offer waterbirth-check with your healthcare provider.) Register only the expectant mom, but you are encouraged to bring your partner to class! Required if planning waterbirth, no fee. Infant/Child CPR: Parents, grandparents, babysitters, and friends learn Cardio-Pulmonary Resuscitation skills for infants and children. You will also learn how to treat both conscious and unconscious choking in infants and children. This Family & Friends program does not offer certification. Register each participant individually to ensure that enough mannequins are available. (Call for fee) Grandparent Love: Expecting a grandbaby? This class is for you! Learn about the latest infant care and safety recommendations and ways to support your own child as he or she transitions into the parenting role. Taught by Registered Nurses who are childbirth instructors, but most importantly...they are grandmothers too! $10/person. Childbirth Class- Natural Childbirth: This series  of 5 weekly classes is for expectant parents who want to learn and practice natural methods of coping with the process of labor and childbirth. Relaxation, breathing, massage, visualization, role of the partner, and helpful positioning are highlighted. Participants learn how to be confident in their body's ability to give birth. This class will empower and help parents make informed decisions about their own care. Includes discussion that will help new parents transition into the immediate postpartum period. [facility] is included. We suggest taking this class between 25-32 weeks, but it's only a recommendation. $75 per registrant and one support person or $30 Medicaid. Childbirth Class- 3 week Series: This option of 3 weekly classes helps  you and your labor partner prepare for childbirth. Newborn care, labor & birth, cesarean birth, pain management, and comfort techniques are discussed and a [place] [facility] is included. The class meets at the same time, on the same day of the week for 3 consecutive weeks beginning with the starting date you choose. $60 for registrant and one support person.  Marvelous Multiples: Expecting twins, triplets, or more? This class covers the differences in labor, birth, parenting, and breastfeeding issues that face multiples' parents. NICU tour is included. Led by a Certified Childbirth Educator who is the mother of twins. No fee. Caring for Baby: This class is for expectant and adoptive parents who want to learn and practice the most up-to-date newborn care for their babies. Focus is on birth through the first six weeks of life. Topics include feeding, bathing, diapering, crying, umbilical cord care, circumcision care and safe sleep. Parents learn to recognize symptoms of illness and when to call the pediatrician. Register only the mom-to-be and your partner or support person can plan to come with you! $10 per  registrant and support person Childbirth Class- online option: This online class offers you the freedom to complete a Birth and Baby series in the comfort of your own home. The flexibility of this option allows you to review sections at your own pace, at times convenient to you and your support people. It includes additional video information, animations, quizzes, and extended activities. Get organized with helpful eClass tools, checklists, and trackers. Once you register online for the class, you will receive an email within a few days to accept the invitation and begin the class when the time is right for you. The content will be available to you for 60 days. $60 for 60 days of online access for you and your support people.  Local Doulas: Natural Baby Doulas [email or web address] Tel: [phone] [email or web address]/ [employer] [phone] [email or web address] .[email or web address] www.[email or web address].com The Labor Hassell Halim  (also do waterbirth tub rental) [phone] [email or web address] Triad Birth Doula [phone] [email or web address] NotebookDistributors.fi Sacred Rhythms  [phone] https://sacred-rhythms.com/ [employer] (PADA) [email or web address] https://www.frey.org/ [place] Birth and Baby  http://[email or web address]/ Considering Waterbirth? Guide for patients at Center for [employer]  Why consider waterbirth?  . Gentle birth for babies . Less pain medicine used in labor . May allow for passive descent/less pushing . May reduce perineal tears  . More mobility and instinctive maternal position changes . Increased maternal relaxation . Reduced blood pressure in labor  Is waterbirth safe? What are the risks of infection, drowning or other complications?  . Infection: o Very low risk (3.7 % for tub vs 4.8% for bed) o 7 in 8000 waterbirths with documented  infection o Poorly cleaned equipment most common cause o Slightly lower group B strep transmission rate  . Drowning o Maternal:  - Very low risk   - Related to seizures or fainting o Newborn:  - Very low risk. No evidence of increased risk of respiratory problems in multiple large studies - Physiological protection from breathing under water - Avoid underwater birth if there are any fetal complications - Once baby's head is out of the water, keep it out.  . Birth complication o Some reports of cord trauma, but risk decreased by bringing baby to surface gradually o No evidence of increased risk of shoulder dystocia. Mothers can usually change positions faster in water than in a bed, possibly aiding the maneuvers to free the shoulder.   You must attend a Waterbirth class at  [facility]  3rd Wednesday of every month from [employer] by calling [phone] or online at [email or web address]  Bring [place] the certificate from the class to your prenatal appointment  Meet with a midwife at 36 weeks to see if you can still plan a waterbirth and to sign the consent.   Purchase or rent the following supplies:   Water Birth Pool (Birth Pool in a Box or [place] for instance)  (Tubs start ~$125)  Single-use disposable tub liner designed for your brand of tub  New garden hose labeled "lead-free", "suitable for drinking water",  Electric drain pump to remove water (We recommend 792 gallon per hour or greater pump.)   Separate garden hose to remove the dirty water  Fish net  Bathing suit top (optional)  Long-handled mirror (optional)  Places to purchase or rent supplies  GotWebTools.is for tub purchases and supplies  Waterbirthsolutions.com for tub purchases and supplies  The Labor Ladies (www.thelaborladies.com) $275 for tub rental/set-up & take down/kit   [employer] (http://www.fleming.com/.htm) Information regarding doulas  (labor support) who provide pool rentals  Our practice has a Birth Pool in a Box tub at the hospital that you may borrow on a first-come-first-served basis. It is your responsibility to to set up, clean and break down the tub. We cannot guarantee the availability of this tub in advance. You are responsible for bringing all accessories listed above. If you do not have all necessary supplies you cannot have a waterbirth.    Things that would prevent you from having a waterbirth:  Premature, <37wks  Previous cesarean birth  Presence of thick meconium-stained fluid  Multiple gestation (Twins, triplets, etc.)  Uncontrolled diabetes or gestational diabetes requiring medication  Hypertension requiring medication or diagnosis of pre-eclampsia  Heavy vaginal bleeding  Non-reassuring fetal heart rate  Active infection (MRSA, etc.). Group B Strep is NOT a contraindication for  waterbirth.  If your labor has to be induced and induction method requires continuous  monitoring of the baby's heart rate  Other risks/issues identified by your obstetrical provider  Please remember that birth is unpredictable. Under certain unforeseeable circumstances your provider may advise against giving birth in the tub. These decisions will be made on a case-by-case basis and with the safety of you and your baby as our highest priority.

## 2018-03-13 LAB — GC/CHLAMYDIA PROBE AMP (~~LOC~~) NOT AT ARMC
CHLAMYDIA, DNA PROBE: NEGATIVE
Neisseria Gonorrhea: NEGATIVE

## 2018-03-14 LAB — URINE CULTURE, OB REFLEX

## 2018-03-14 LAB — CULTURE, OB URINE

## 2018-03-16 LAB — OBSTETRIC PANEL, INCLUDING HIV
ANTIBODY SCREEN: NEGATIVE
BASOS ABS: 0 10*3/uL (ref 0.0–0.2)
BASOS: 0 %
EOS (ABSOLUTE): 0.1 10*3/uL (ref 0.0–0.4)
EOS: 0 %
HEMATOCRIT: 38.4 % (ref 34.0–46.6)
HEMOGLOBIN: 12.5 g/dL (ref 11.1–15.9)
HEP B S AG: NEGATIVE
HIV Screen 4th Generation wRfx: NONREACTIVE
Immature Grans (Abs): 0 10*3/uL (ref 0.0–0.1)
Immature Granulocytes: 0 %
Lymphocytes Absolute: 2.4 10*3/uL (ref 0.7–3.1)
Lymphs: 20 %
MCH: 28.7 pg (ref 26.6–33.0)
MCHC: 32.6 g/dL (ref 31.5–35.7)
MCV: 88 fL (ref 79–97)
MONOCYTES: 6 %
Monocytes Absolute: 0.7 10*3/uL (ref 0.1–0.9)
Neutrophils Absolute: 8.9 10*3/uL — ABNORMAL HIGH (ref 1.4–7.0)
Neutrophils: 74 %
Platelets: 395 10*3/uL (ref 150–450)
RBC: 4.35 x10E6/uL (ref 3.77–5.28)
RDW: 13.8 % (ref 12.3–15.4)
RPR: NONREACTIVE
RUBELLA: 1.02 {index} (ref >0.99)
Rh Factor: POSITIVE
WBC: 12 10*3/uL — ABNORMAL HIGH (ref 3.4–10.8)

## 2018-03-16 LAB — SMN1 COPY NUMBER ANALYSIS (SMA CARRIER SCREENING)

## 2018-03-16 LAB — HEMOGLOBINOPATHY EVALUATION
Ferritin: 15 ng/mL (ref 15–150)
HGB C: 0 %
HGB S: 0 %
Hgb A2 Quant: 2 % (ref 1.8–3.2)
Hgb A: 98 % (ref 96.4–98.8)
Hgb F Quant: 0 % (ref 0.0–2.0)
Hgb Solubility: NEGATIVE
Hgb Variant: 0 %

## 2018-03-16 LAB — PROTIME-INR
INR: 0.9 (ref 0.8–1.2)
Prothrombin Time: 9.8 s (ref 9.1–12.0)

## 2018-03-16 LAB — VON WILLEBRAND PANEL
Factor VIII Activity: 153 % — ABNORMAL HIGH (ref 56–140)
VON WILLEBRAND FACTOR: 124 % (ref 50–200)
Von Willebrand Ag: 180 % (ref 50–200)

## 2018-03-16 LAB — HEMOGLOBIN A1C
ESTIMATED AVERAGE GLUCOSE: 108 mg/dL
Hgb A1c MFr Bld: 5.4 % (ref 4.8–5.6)

## 2018-03-16 LAB — COAG STUDIES INTERP REPORT

## 2018-03-16 LAB — APTT: aPTT: 29 s (ref 24–33)

## 2018-03-21 ENCOUNTER — Encounter: Payer: Self-pay | Admitting: *Deleted

## 2018-03-22 ENCOUNTER — Encounter: Payer: Self-pay | Admitting: Hematology

## 2018-03-22 ENCOUNTER — Telehealth: Payer: Self-pay | Admitting: *Deleted

## 2018-03-22 ENCOUNTER — Telehealth: Payer: Self-pay | Admitting: Hematology

## 2018-03-22 NOTE — Telephone Encounter (Signed)
New hematology referral received from Thressa ShellerHeather Hogan, CNM for [facility]. Pt has been scheduled to see Dr. Candise CheKale on 7/3 at 10am. Pt aware to arrive 30 minutes early. Letter mailed.

## 2018-03-27 ENCOUNTER — Encounter: Payer: Self-pay | Admitting: Advanced Practice Midwife

## 2018-04-05 ENCOUNTER — Ambulatory Visit (INDEPENDENT_AMBULATORY_CARE_PROVIDER_SITE_OTHER): Payer: Medicaid Other | Admitting: Student

## 2018-04-05 VITALS — BP 107/61 | HR 68 | Wt 203.1 lb

## 2018-04-05 DIAGNOSIS — D6800 Von Willebrand disease, unspecified: Secondary | ICD-10-CM

## 2018-04-05 DIAGNOSIS — Z3480 Encounter for supervision of other normal pregnancy, unspecified trimester: Secondary | ICD-10-CM

## 2018-04-05 NOTE — Progress Notes (Signed)
[name], female   DOB: 11/02/1990, 27 y.o.   MRN: [id number]   PRENATAL VISIT NOTE  Subjective:  Victoria CulverRachael Nguyen is a 27 y.o. G3P0020 at 8946w5d being seen today for ongoing prenatal care.  She is currently monitored for the following issues for this high-risk pregnancy and has Von Willebrand disease (HCC); History of multiple miscarriages; Closed nondisplaced fracture of middle phalanx of right ring finger; and Supervision of other normal pregnancy, antepartum on their problem list.  Patient reports no complaints.  Contractions: Not present. Vag. Bleeding: None.  Movement: Absent. Denies leaking of fluid.   The following portions of the patient's history were reviewed and updated as appropriate: allergies, current medications, past family history, past medical history, past social history, past surgical history and problem list. Problem list updated.  Objective:   Vitals:   04/05/18 1030  BP: 107/61  Pulse: 68  Weight: 203 lb 1.6 oz (92.1 kg)    Fetal Status: Fetal Heart Rate (bpm): 147   Movement: Absent     General:  Alert, oriented and cooperative. Patient is in no acute distress.  Skin: Skin is warm and dry. No rash noted.   Cardiovascular: Normal heart rate noted  Respiratory: Normal respiratory effort, no problems with respiration noted  Abdomen: Soft, gravid, appropriate for gestational age.  Pain/Pressure: Present     Pelvic: Cervical exam deferred        Extremities: Normal range of motion.  Edema: None  Mental Status: Normal mood and affect. Normal behavior. Normal judgment and thought content.   Assessment and Plan:  Pregnancy: G3P0020 at 546w5d  1. Supervision of other normal pregnancy, antepartum -needs MSAFP at 20 week visit.  - US MFM OB DETAIL +14 WK; Future  2. Von Willebrand disease (HCC) -Patient has follow-up appt on 04-11-2018 with hematology; is now a red chart patient.   Preterm labor symptoms and general obstetric precautions including but not  limited to vaginal bleeding, contractions, leaking of fluid and fetal movement were reviewed in detail with the patient. Please refer to After Visit Summary for other counseling recommendations.  No follow-ups on file.  Future Appointments  Date Time Provider Department Center  04/11/2018 10:00 AM Johney MaineKale, Gautam Kishore, MD [facility] None  04/30/2018 10:30 AM [facility] US 1 [facility]-US  05/02/2018 10:35 AM Judeth HornLawrence, Erin, NP [facility]    Charlesetta GaribaldiKathryn Lorraine [place], [place]

## 2018-04-05 NOTE — Progress Notes (Signed)
States was notified of Hematology appt next week.

## 2018-04-06 ENCOUNTER — Encounter: Payer: Medicaid Other | Admitting: Nurse Practitioner

## 2018-04-10 NOTE — Progress Notes (Signed)
HEMATOLOGY/ONCOLOGY CONSULTATION NOTE  Date of Service: 04/11/2018  Patient Care Team: Patient, No Pcp Per as PCP - General (General Practice)  CHIEF COMPLAINTS/PURPOSE OF CONSULTATION:  Elevated Factor VIII Activity  HISTORY OF PRESENTING ILLNESS:   [name] is a wonderful 27 y.o. female who has been referred to [place] by Thressa Sheller, CNM for evaluation and management of elevated Factor VIII activity. She is at [redacted]w[redacted]d Gestation. The pt reports that she is doing well overall.   The pt notes that she had a pregnancy in 2017 that failed in the fifth week, and in 2018 a failed pregnancy at the tenth week. She notes that the products of conception came out naturally and there was not work up to figure out the causes.   The pt reports that she previously saw a hematologist about 10 years ago in [place] due to some concerns for bruising sustained in marching band. She has an epipen and an inhaler and notes allergy to bee venom.   She notes that her periods have always been heavy, but PO contraceptives decreased the heaviness.  She denies any blood transfusions and has taken PO Iron replacement in the past. She did not seek out OBGYN evaluation for her heavy periods.   She did not have excessive bleeding in her multiple sports related hand injuries requiring sutures.   She is taking her prenatal vitamins but is not taking PO Iron. She denies any other concerns with her pregnancy.   Of note prior to the patient's visit today, pt has had Von Willebrand Panel completed on 03/12/18 with results revealing elevated Factor VIII activity at 153.  Von Willebrand panel 01/18/17 was entirely WNL 03/12/18 PTT and INR/PT were both normal.   Most recent lab results (01/15/18) of CBC  is as follows: all values are WNL except for WBC at 11.3k, PLT at 473k.  On review of systems, pt reports history of heavy periods, some history of abnormal bruising, appropriate weight gain, and denies spontaneous/abnormal  bruising, nose bleeds, joint bleeding, GI bleeding, gum bleeds, blood in the stools, abdominal pains, and any other symptoms.   On PMHx the pt reports hand surgeries from sports related injuries. Denies any history of blood clots.  On Social Hx the pt denies much ETOH consumption or smoking On Family Hx the pt denies Von Willebrand's, clotting disorders or bleeding disorders, or blood clots.   MEDICAL HISTORY:  Past Medical History:  Diagnosis Date  . Anemia   . Anxiety   . Asthma    exercise induced  . Complication of anesthesia   . Head injury, closed    scalp laceration, dizzy for a few minutes  . History of chlamydia   . PONV (postoperative nausea and vomiting)   . Von Willebrand disease (HCC)     SURGICAL HISTORY: Past Surgical History:  Procedure Laterality Date  . HAND SURGERY Left 2009   thumb , fracture  . HAND SURGERY Left 2006   Pins placed  . HARDWARE REMOVAL Left 02/10/2016   Procedure: LEFT SMALL FINGER DEEP IMPLANT REMOVAL;  Surgeon: Bradly Bienenstock, MD;  Location: [facility] OR;  Service: Orthopedics;  Laterality: Left;  . OPEN REDUCTION INTERNAL FIXATION (ORIF) PROXIMAL PHALANX Left 01/06/2016   Procedure: OPEN REDUCTION INTERNAL FIXATION (ORIF) LEFT SMALL FINGER;  Surgeon: Bradly Bienenstock, MD;  Location: [facility] OR;  Service: Orthopedics;  Laterality: Left;    SOCIAL HISTORY: Social History   Socioeconomic History  . Marital status: Single    Spouse name:  Not on file  . Number of children: Not on file  . Years of education: Not on file  . Highest education level: Not on file  Occupational History  . Not on file  Social Needs  . Financial resource strain: Not on file  . Food insecurity:    Worry: Not on file    Inability: Not on file  . Transportation needs:    Medical: Not on file    Non-medical: Not on file  Tobacco Use  . Smoking status: Never Smoker  . Smokeless tobacco: Never Used  Substance and Sexual Activity  . Alcohol use: Not Currently    Comment:  Currently no drinking due to pregnancy  . Drug use: No  . Sexual activity: Yes    Birth control/protection: None  Lifestyle  . Physical activity:    Days per week: Not on file    Minutes per session: Not on file  . Stress: Not on file  Relationships  . Social connections:    Talks on phone: Not on file    Gets together: Not on file    Attends religious service: Not on file    Active member of club or organization: Not on file    Attends meetings of clubs or organizations: Not on file    Relationship status: Not on file  . Intimate partner violence:    Fear of current or ex partner: Not on file    Emotionally abused: Not on file    Physically abused: Not on file    Forced sexual activity: Not on file  Other Topics Concern  . Not on file  Social History Narrative  . Not on file    FAMILY HISTORY: Family History  Problem Relation Age of Onset  . Diabetes Father   . Hypertension Father   . Hypercholesterolemia Father   . Heart disease Father   . Cancer Paternal Grandfather   . Cancer Paternal Grandmother     ALLERGIES:  is allergic to bee venom and adhesive [tape].  MEDICATIONS:  Current Outpatient Medications  Medication Sig Dispense Refill  . acetaminophen (TYLENOL) 325 MG tablet Take 650 mg by mouth as needed.    Marland Kitchen albuterol (PROVENTIL HFA;VENTOLIN HFA) 108 (90 Base) MCG/ACT inhaler Inhale 1-2 puffs into the lungs every 6 (six) hours as needed for wheezing or shortness of breath.     . EPINEPHrine 0.3 mg/0.3 mL IJ SOAJ injection Inject 0.3 mLs (0.3 mg total) into the muscle once as needed. As needed for bee stings 2 Device 0  . Prenatal Vit-Fe Fumarate-FA (MULTIVITAMIN-PRENATAL) 27-0.8 MG TABS tablet Take 1 tablet by mouth daily at 12 noon.     No current facility-administered medications for this visit.     REVIEW OF SYSTEMS:    10 Point review of Systems was done is negative except as noted above.  PHYSICAL EXAMINATION:  . Vitals:   04/11/18 1040  BP:  112/67  Pulse: 68  Resp: 18  Temp: 97.9 F (36.6 C)  SpO2: 100%   Filed Weights   04/11/18 1040  Weight: 204 lb 4.8 oz (92.7 kg)   .Body mass index is 36.19 kg/m.  GENERAL:alert, in no acute distress and comfortable SKIN: no acute rashes, no significant lesions EYES: conjunctiva are pink and non-injected, sclera anicteric OROPHARYNX: MMM, no exudates, no oropharyngeal erythema or ulceration NECK: supple, no JVD LYMPH:  no palpable lymphadenopathy in the cervical, axillary or inguinal regions LUNGS: clear to auscultation b/l with normal respiratory effort HEART:  regular rate & rhythm ABDOMEN:  normoactive bowel sounds , non tender, not distended. Extremity: no pedal edema PSYCH: alert & oriented x 3 with fluent speech NEURO: no focal motor/sensory deficits  LABORATORY DATA:  I have reviewed the data as listed  . CBC Latest Ref Rng & Units 03/12/2018 01/15/2018 12/30/2016  WBC 3.4 - 10.8 x10E3/uL 12.0(H) 11.3(H) 14.8(H)  Hemoglobin 11.1 - 15.9 g/dL 16.1 09.6 04.5  Hematocrit 34.0 - 46.6 % 38.4 36.1 37.5  Platelets 150 - 450 x10E3/uL 395 473(H) 338    . CMP Latest Ref Rng & Units 11/12/2013  Glucose 70 - 99 mg/dL 97  BUN 6 - 23 mg/dL 11  Creatinine 4.09 - 8.11 mg/dL 9.14  Sodium 782 - 956 mEq/L 137  Potassium 3.7 - 5.3 mEq/L 3.3(L)  Chloride 96 - 112 mEq/L 103  CO2 19 - 32 mEq/L 19  Calcium 8.4 - 10.5 mg/dL 8.6   Component     Latest Ref Rng & Units 01/18/2017 03/12/2018  Hgb Solubility     Negative  Negative  Hgb F Quant     0.0 - 2.0 %  0.0  Hgb A     96.4 - 98.8 %  98.0  HGB S     0.0 %  0.0  HGB C     0.0 %  0.0  Hgb A2 Quant     1.8 - 3.2 %  2.0  HGB VARIANT     0.0 %  0.0  HGB INTERPRETATION       Comment  Ferritin     15 - 150 ng/mL  15  Alpha Thal Reflex       Not indicated.  Factor VIII Activity     56 - 140 % 114 153 (H)  von Willebrand Factor (vWF) Ag     50 - 200 % 115 180  vWF Activity     50 - 200 % 79 124  INR     0.8 - 1.2  0.9    Prothrombin Time     9.1 - 12.0 sec  9.8  APTT     24 - 33 sec  29    RADIOGRAPHIC STUDIES: I have personally reviewed the radiological images as listed and agreed with the findings in the report. No results found.  ASSESSMENT & PLAN:   26 y.o. female with  1. Elevated Factor VIII  -Discussed patient's most recent labs from 03/12/18, Von Willebrand Panel showed elevated Factor VIII at 153 -only borderline elevated (previously WNL at 114 in 01/2017). Elevation likely as an acute phase reactant during bleeding /pain/inflammation or bleeding. -Discussed that her 03/12/18 Von Willebrand panel as well as her VWP on 01/18/2017, prior to pregnancy, was normal. -03/12/18 INR/PT and PTT labs were normal -no indication of a primary hemostatic disorder/bleeding disorder based on her clinical history and lab results. -continue with OB-Gyn cares -consider PO iron polysaccharide 150mg  po daily if ferritin <50 on labs with OBGyn -Will be happy to see the pt again as needed   2. H/o Previous miscarriage x 2 -- apparently unrevealing thrombophilia w/u previously in 2018  RTC with Dr Candise Che as needed  All of the patients questions were answered with apparent satisfaction. The patient knows to call the clinic with any problems, questions or concerns.  The total time spent in the appt was 30 minutes and more than 50% was on counseling and direct patient cares.    Wyvonnia Lora MD MS AAHIVMS [facility] Hematology/Oncology Physician  [facility]  (Office):       [phone] (Work cell):  [phone] (Fax):           [phone]  04/11/2018 11:44 AM  I, Marcelline MatesSchuyler Bain, am acting as a Neurosurgeonscribe for Dr Candise CheKale.   .I have reviewed the above documentation for accuracy and completeness, and I agree with the above. Johney Maine.Earon Rivest Kishore Axavier Pressley MD

## 2018-04-11 ENCOUNTER — Encounter: Payer: Self-pay | Admitting: Hematology

## 2018-04-11 ENCOUNTER — Inpatient Hospital Stay: Payer: Medicaid Other | Attending: Hematology | Admitting: Hematology

## 2018-04-11 VITALS — BP 112/67 | HR 68 | Temp 97.9°F | Resp 18 | Ht 63.0 in | Wt 204.3 lb

## 2018-04-11 DIAGNOSIS — D680 Von Willebrand's disease: Secondary | ICD-10-CM | POA: Diagnosis not present

## 2018-04-11 DIAGNOSIS — O99112 Other diseases of the blood and blood-forming organs and certain disorders involving the immune mechanism complicating pregnancy, second trimester: Secondary | ICD-10-CM

## 2018-04-11 DIAGNOSIS — Z79899 Other long term (current) drug therapy: Secondary | ICD-10-CM

## 2018-04-11 DIAGNOSIS — R791 Abnormal coagulation profile: Secondary | ICD-10-CM

## 2018-04-11 DIAGNOSIS — Z3A16 16 weeks gestation of pregnancy: Secondary | ICD-10-CM | POA: Diagnosis not present

## 2018-04-13 ENCOUNTER — Telehealth: Payer: Self-pay | Admitting: Hematology

## 2018-04-13 NOTE — Telephone Encounter (Signed)
No LOS 7/3 °

## 2018-04-16 NOTE — Progress Notes (Signed)
I have reviewed the chart and agree with nursing staff's documentation of this patient's encounter.  Thressa ShellerHeather Damonica Chopra, CNM 04/16/2018 7:54 PM

## 2018-04-23 ENCOUNTER — Encounter: Payer: Self-pay | Admitting: Student

## 2018-04-23 ENCOUNTER — Encounter (HOSPITAL_COMMUNITY): Payer: Self-pay

## 2018-04-30 ENCOUNTER — Other Ambulatory Visit (HOSPITAL_COMMUNITY): Payer: Self-pay | Admitting: *Deleted

## 2018-04-30 ENCOUNTER — Ambulatory Visit (HOSPITAL_COMMUNITY)
Admission: RE | Admit: 2018-04-30 | Discharge: 2018-04-30 | Disposition: A | Discharge status: Home / Self Care | Payer: Medicaid Other | Source: Ambulatory Visit | Attending: Student | Admitting: Student

## 2018-04-30 ENCOUNTER — Encounter (HOSPITAL_COMMUNITY): Payer: Self-pay

## 2018-04-30 DIAGNOSIS — O99213 Obesity complicating pregnancy, third trimester: Secondary | ICD-10-CM | POA: Diagnosis present

## 2018-04-30 DIAGNOSIS — O2692 Pregnancy related conditions, unspecified, second trimester: Secondary | ICD-10-CM | POA: Diagnosis not present

## 2018-04-30 DIAGNOSIS — O2690 Pregnancy related conditions, unspecified, unspecified trimester: Secondary | ICD-10-CM

## 2018-04-30 DIAGNOSIS — Z3A19 19 weeks gestation of pregnancy: Secondary | ICD-10-CM | POA: Diagnosis not present

## 2018-04-30 DIAGNOSIS — Z363 Encounter for antenatal screening for malformations: Secondary | ICD-10-CM | POA: Diagnosis not present

## 2018-04-30 DIAGNOSIS — D6800 Von Willebrand disease, unspecified: Secondary | ICD-10-CM

## 2018-04-30 DIAGNOSIS — Z3480 Encounter for supervision of other normal pregnancy, unspecified trimester: Secondary | ICD-10-CM

## 2018-04-30 DIAGNOSIS — Z362 Encounter for other antenatal screening follow-up: Secondary | ICD-10-CM

## 2018-05-02 ENCOUNTER — Encounter: Payer: Medicaid Other | Admitting: Student

## 2018-05-16 ENCOUNTER — Ambulatory Visit (INDEPENDENT_AMBULATORY_CARE_PROVIDER_SITE_OTHER): Payer: Medicaid Other | Admitting: Obstetrics and Gynecology

## 2018-05-16 VITALS — BP 109/64 | Wt 202.0 lb

## 2018-05-16 DIAGNOSIS — O26852 Spotting complicating pregnancy, second trimester: Secondary | ICD-10-CM

## 2018-05-16 DIAGNOSIS — Z3480 Encounter for supervision of other normal pregnancy, unspecified trimester: Secondary | ICD-10-CM

## 2018-05-16 NOTE — Progress Notes (Signed)
Pt states shes been bleeding this AM after having intercourse, wants to be checked and referred to dental.

## 2018-05-16 NOTE — Patient Instructions (Signed)
Vaginal Bleeding During Pregnancy, Second Trimester A small amount of bleeding (spotting) from the vagina is common in pregnancy. Sometimes the bleeding is normal and is not a problem, and sometimes it is a sign of something serious. Be sure to tell your doctor about any bleeding from your vagina right away. Follow these instructions at home:  Watch your condition for any changes.  Follow your doctor's instructions about how active you can be.  If you are on bed rest: ? You may need to stay in bed and only get up to use the bathroom. ? You may be allowed to do some activities. ? If you need help, make plans for someone to help you.  Write down: ? The number of pads you use each day. ? How often you change pads. ? How soaked (saturated) your pads are.  Do not use tampons.  Do not douche.  Do not have sex or orgasms until your doctor says it is okay.  If you pass any tissue from your vagina, save the tissue so you can show it to your doctor.  Only take medicines as told by your doctor.  Do not take aspirin because it can make you bleed.  Do not exercise, lift heavy weights, or do any activities that take a lot of energy and effort unless your doctor says it is okay.  Keep all follow-up visits as told by your doctor. Contact a doctor if:  You bleed from your vagina.  You have cramps.  You have labor pains.  You have a fever that does not go away after you take medicine. Get help right away if:  You have very bad cramps in your back or belly (abdomen).  You have contractions.  You have chills.  You pass large clots or tissue from your vagina.  You bleed more.  You feel light-headed or weak.  You pass out (faint).  You are leaking fluid or have a gush of fluid from your vagina. This information is not intended to replace advice given to you by your health care provider. Make sure you discuss any questions you have with your health care provider. Document  Released: 02/10/2014 Document Revised: 03/03/2016 Document Reviewed: 06/03/2013 Elsevier Interactive Patient Education  2018 Elsevier Inc.  

## 2018-05-16 NOTE — Progress Notes (Signed)
   PRENATAL VISIT NOTE  Subjective:  [name] is a 27 y.o. G3P0020 at 5315w4d being seen today for ongoing prenatal care.  She is currently monitored for the following issues for this high-risk pregnancy and has Von Willebrand disease (HCC); History of multiple miscarriages; Closed nondisplaced fracture of middle phalanx of right ring finger; and Supervision of other normal pregnancy, antepartum on their problem list.  Patient reports vaginal spotting after intercourse this morning. Vaginal spotting has stopped since the last time she went to the [facility]. "Very worried, because I started bleeding just before having a miscarriage the last time."  Contractions: Not present. Vag. Bleeding: Scant.  Movement: Present. Denies leaking of fluid.   The following portions of the patient's history were reviewed and updated as appropriate: allergies, current medications, past family history, past medical history, past social history, past surgical history and problem list. Problem list updated.  Objective:   Vitals:   05/16/18 0916  BP: 109/64  Weight: 202 lb (91.6 kg)    Fetal Status: Fetal Heart Rate (bpm): 146 Fundal Height: 22 cm Movement: Present     General:  Alert, oriented and cooperative. Patient is in no acute distress.  Skin: Skin is warm and dry. No rash noted.   Cardiovascular: Normal heart rate noted  Respiratory: Normal respiratory effort, no problems with respiration noted  Abdomen: Soft, gravid, appropriate for gestational age.  Pain/Pressure: Absent     Pelvic: Cervical exam performed SSE performed - no bleeding or spotting visualized, cervix appeared closed and long  Extremities: Normal range of motion.  Edema: Trace  Mental Status: Normal mood and affect. Normal behavior. Normal judgment and thought content.   Assessment and Plan:  Pregnancy: G3P0020 at 6415w4d  1. Supervision of other normal pregnancy, antepartum  2. Spotting affecting pregnancy in second trimester -  Reassurance given that no vaginal spotting or bleeding was seen on SSE - Discussed normalcy of postcoital spotting/bleeding - Bleeding precautions given - Advised to go to MAU for bleeding like a period and/or abdominal pain/cramping  Preterm labor symptoms and general obstetric precautions including but not limited to vaginal bleeding, contractions, leaking of fluid and fetal movement were reviewed in detail with the patient. Please refer to After Visit Summary for other counseling recommendations.  Return in about 1 month (around 06/13/2018) for Return OB visit.  Future Appointments  Date Time Provider Department Center  06/12/2018  8:30 AM [facility] US 5 [facility]-M[facility] M[facility]-US    Raelyn Moraolitta Kimbrely Buckel, CNM

## 2018-06-12 ENCOUNTER — Other Ambulatory Visit (HOSPITAL_COMMUNITY): Payer: Self-pay | Admitting: *Deleted

## 2018-06-12 ENCOUNTER — Ambulatory Visit (HOSPITAL_COMMUNITY)
Admission: RE | Admit: 2018-06-12 | Discharge: 2018-06-12 | Disposition: A | Discharge status: Home / Self Care | Payer: Medicaid Other | Source: Ambulatory Visit | Attending: Student | Admitting: Student

## 2018-06-12 DIAGNOSIS — O99112 Other diseases of the blood and blood-forming organs and certain disorders involving the immune mechanism complicating pregnancy, second trimester: Secondary | ICD-10-CM | POA: Diagnosis not present

## 2018-06-12 DIAGNOSIS — Z362 Encounter for other antenatal screening follow-up: Secondary | ICD-10-CM | POA: Diagnosis not present

## 2018-06-12 DIAGNOSIS — O2692 Pregnancy related conditions, unspecified, second trimester: Secondary | ICD-10-CM | POA: Diagnosis not present

## 2018-06-12 DIAGNOSIS — O99213 Obesity complicating pregnancy, third trimester: Secondary | ICD-10-CM | POA: Diagnosis not present

## 2018-06-12 DIAGNOSIS — D680 Von Willebrand's disease: Secondary | ICD-10-CM | POA: Diagnosis not present

## 2018-06-12 DIAGNOSIS — O99212 Obesity complicating pregnancy, second trimester: Secondary | ICD-10-CM | POA: Insufficient documentation

## 2018-06-12 DIAGNOSIS — O321XX0 Maternal care for breech presentation, not applicable or unspecified: Secondary | ICD-10-CM | POA: Diagnosis not present

## 2018-06-12 DIAGNOSIS — E669 Obesity, unspecified: Secondary | ICD-10-CM | POA: Insufficient documentation

## 2018-06-12 DIAGNOSIS — O4443 Low lying placenta NOS or without hemorrhage, third trimester: Secondary | ICD-10-CM

## 2018-06-12 DIAGNOSIS — Z3A25 25 weeks gestation of pregnancy: Secondary | ICD-10-CM | POA: Diagnosis not present

## 2018-06-14 ENCOUNTER — Ambulatory Visit (INDEPENDENT_AMBULATORY_CARE_PROVIDER_SITE_OTHER): Payer: Medicaid Other | Admitting: Student

## 2018-06-14 ENCOUNTER — Telehealth: Payer: Self-pay

## 2018-06-14 VITALS — BP 103/56 | HR 87 | Wt 203.2 lb

## 2018-06-14 DIAGNOSIS — D6800 Von Willebrand disease, unspecified: Secondary | ICD-10-CM

## 2018-06-14 DIAGNOSIS — Z3480 Encounter for supervision of other normal pregnancy, unspecified trimester: Secondary | ICD-10-CM

## 2018-06-14 MED ORDER — FERROUS FUMARATE 325 (106 FE) MG PO TABS: 1.0000 | ORAL_TABLET | Freq: Two times a day (BID) | ORAL | 0 refills | Status: DC

## 2018-06-14 MED ORDER — DOCUSATE SODIUM 100 MG PO CAPS: 100.0000 mg | ORAL_CAPSULE | Freq: Two times a day (BID) | ORAL | 0 refills | Status: DC

## 2018-06-14 NOTE — Progress Notes (Signed)
[name], female   DOB: 05/17/1991, 27 y.o.   MRN: [id number]   PRENATAL VISIT NOTE  Subjective:  [name] is a 27 y.o. G3P0020 at [redacted]w[redacted]d being seen today for ongoing prenatal care.  She is currently monitored for the following issues for this high-risk pregnancy and has Von Willebrand disease (HCC); History of multiple miscarriages; Closed nondisplaced fracture of middle phalanx of right ring finger; and Supervision of other normal pregnancy, antepartum on their problem list.  Patient reports nosebleed last night, which she has not had in the past. Also now with bloody gums over the past two weeks. .  Contractions: Not present. Vag. Bleeding: None.  Movement: Present. Denies leaking of fluid.   The following portions of the patient's history were reviewed and updated as appropriate: allergies, current medications, past family history, past medical history, past social history, past surgical history and problem list. Problem list updated.  Objective:   Vitals:   06/14/18 0823  BP: (!) 103/56  Pulse: 87  Weight: 203 lb 3.2 oz (92.2 kg)    Fetal Status: Fetal Heart Rate (bpm): 148 Fundal Height: 25 cm Movement: Present     General:  Alert, oriented and cooperative. Patient is in no acute distress.  Skin: Skin is warm and dry. No rash noted.   Cardiovascular: Normal heart rate noted  Respiratory: Normal respiratory effort, no problems with respiration noted  Abdomen: Soft, gravid, appropriate for gestational age.  Pain/Pressure: Absent     Pelvic: Cervical exam deferred        Extremities: Normal range of motion.  Edema: Trace  Mental Status: Normal mood and affect. Normal behavior. Normal judgment and thought content.   Assessment and Plan:  Pregnancy: G3P0020 at [redacted]w[redacted]d  1. Supervision of other normal pregnancy, antepartum -RX for iron and colace  2. Von Willebrand disease (HCC) -will make appt for hematology, given new onset of bloody gums and nosebleeds, which  patient has not had in the past. May be pysiological changes of pregnancy.  -repeat VW disease labs today  Preterm labor symptoms and general obstetric precautions including but not limited to vaginal bleeding, contractions, leaking of fluid and fetal movement were reviewed in detail with the patient. Please refer to After Visit Summary for other counseling recommendations.  Return in about 4 weeks (around 07/12/2018), or 2 hr gtt and LROb.  Future Appointments  Date Time Provider Department Center  07/12/2018  8:50 AM [facility] LAB [facility]OC  07/12/2018 10:35 AM Rasch, Harolyn Rutherford, NP [facility] WOC  07/31/2018  8:00 AM [facility] [place] 3 [facility]-M[facility]US M[facility]-US    Marylene Land, CNM

## 2018-06-14 NOTE — Progress Notes (Signed)
[facility] Needs referral re sent Pt states nose bleed yesterday, but resolved Eye Redness shortly after nosebleed.

## 2018-06-14 NOTE — Patient Instructions (Signed)

## 2018-06-14 NOTE — Telephone Encounter (Signed)
Pt experiencing bleeding gums and nosebleeds. Requesting f/u with Dr. Candise Che as recommended by Luna Kitchens, CNM. In-basket sent to Dr. Candise Che and desk nurse to f/u.

## 2018-06-18 ENCOUNTER — Telehealth: Payer: Self-pay

## 2018-06-18 LAB — COAG STUDIES INTERP REPORT

## 2018-06-18 LAB — PROTIME-INR
INR: 1 (ref 0.8–1.2)
Prothrombin Time: 10.1 s (ref 9.1–12.0)

## 2018-06-18 LAB — APTT: APTT: 28 s (ref 24–33)

## 2018-06-18 LAB — VON WILLEBRAND PANEL
Factor VIII Activity: 103 % (ref 56–140)
Von Willebrand Ag: 176 % (ref 50–200)
Von Willebrand Factor: 112 % (ref 50–200)

## 2018-06-18 NOTE — Telephone Encounter (Signed)
-----   Message from Johney Maine, MD sent at 06/18/2018  9:05 AM EDT ----- Regarding: RE: F/u request No evidence of bleeding disorder based on recent w/u. Would recommend f/u with PCP to evaluate for local etiology of bleeding/medications etc PCP could consider ENT/dental evaluation based on their assessments. If after that no cause if found we shall see her back if needed. thx GK  ----- Message ----- From: Althia Forts, RN Sent: 06/14/2018   5:24 PM EDT To: Virgie Dad, RN, Johney Maine, MD Subject: F/u request                                    Called pt who had been advised to f/u by Luna Kitchens, CNM. Pt has been experiencing additional symptoms of nosebleeds and gums bleeding. Please review and send message to schedulers if you would like to set up a visit. Pt expects call from RN Friday or Monday.  Thanks!

## 2018-06-18 NOTE — Telephone Encounter (Signed)
Called patient and made her aware that Dr. Candise Che stated there is no evidence of bleeding disorder based on recent work up. Informed patient that Dr. Candise Che recommends she follow up with her PCP to evaluate for local etiology of bleeding/medications etc and that PCP should consider ENT/dental evaluation based on their assessments. Informed patient that per Dr. Candise Che if after that no cause is found then she can be seen if needed. Patient verbalized understanding and also requested that I forward the information to Luna Kitchens, CNM at the [facility] [employer].

## 2018-06-19 ENCOUNTER — Telehealth: Payer: Self-pay | Admitting: General Practice

## 2018-06-19 ENCOUNTER — Encounter: Payer: Self-pay | Admitting: *Deleted

## 2018-06-19 NOTE — Telephone Encounter (Signed)
Patient called and left message on nurse voicemail line stating she is trying to get in with [facility] but was told she needed to sign a release of information. Patient wants to know if we can send this electronically. Called patient & told her we cannot send a ROI through [place] and she will need to come in for that. Patient verbalized understanding & had no questions.

## 2018-06-20 ENCOUNTER — Encounter: Payer: Self-pay | Admitting: Family Medicine

## 2018-07-10 ENCOUNTER — Telehealth: Payer: Self-pay | Admitting: General Practice

## 2018-07-10 NOTE — Telephone Encounter (Signed)
Patient called and left message on nurse voicemail line stating she just had a tooth pulled and wants to know if she can take rapid release tylenol. Called patient, no answer- left message on voicemail stating we are trying to reach you to return your phone call. You may take rapid release tylenol, that isn't a problem. Please call us back if you have other questions.

## 2018-07-12 ENCOUNTER — Ambulatory Visit (INDEPENDENT_AMBULATORY_CARE_PROVIDER_SITE_OTHER): Payer: Medicaid Other | Admitting: Obstetrics and Gynecology

## 2018-07-12 ENCOUNTER — Other Ambulatory Visit: Payer: Medicaid Other

## 2018-07-12 ENCOUNTER — Other Ambulatory Visit: Payer: Self-pay | Admitting: *Deleted

## 2018-07-12 VITALS — BP 106/62 | HR 93 | Wt 206.3 lb

## 2018-07-12 DIAGNOSIS — Z3483 Encounter for supervision of other normal pregnancy, third trimester: Secondary | ICD-10-CM

## 2018-07-12 DIAGNOSIS — Z3480 Encounter for supervision of other normal pregnancy, unspecified trimester: Secondary | ICD-10-CM

## 2018-07-12 DIAGNOSIS — D6800 Von Willebrand disease, unspecified: Secondary | ICD-10-CM

## 2018-07-12 DIAGNOSIS — O4440 Low lying placenta NOS or without hemorrhage, unspecified trimester: Secondary | ICD-10-CM | POA: Insufficient documentation

## 2018-07-12 DIAGNOSIS — O4443 Low lying placenta NOS or without hemorrhage, third trimester: Secondary | ICD-10-CM

## 2018-07-12 NOTE — Progress Notes (Signed)
   PRENATAL VISIT NOTE  Subjective:  [name] is a 27 y.o. G3P0020 at [redacted]w[redacted]d being seen today for ongoing prenatal care.  She is currently monitored for the following issues for this low-risk pregnancy and has Von Willebrand disease (HCC); History of multiple miscarriages; Closed nondisplaced fracture of middle phalanx of right ring finger; Supervision of other normal pregnancy, antepartum; and Low-lying placenta on their problem list.  Patient reports Dry skin on her face. .  Contractions: Irritability. Vag. Bleeding: None.  Movement: Present. Denies leaking of fluid.   The following portions of the patient's history were reviewed and updated as appropriate: allergies, current medications, past family history, past medical history, past social history, past surgical history and problem list. Problem list updated.  Objective:   Vitals:   07/12/18 1052  BP: 106/62  Pulse: 93  Weight: 206 lb 4.8 oz (93.6 kg)    Fetal Status: Fetal Heart Rate (bpm): 137 Fundal Height: 30 cm Movement: Present     General:  Alert, oriented and cooperative. Patient is in no acute distress.  Skin: Skin is warm and dry. No rash noted.   Cardiovascular: Normal heart rate noted  Respiratory: Normal respiratory effort, no problems with respiration noted  Abdomen: Soft, gravid, appropriate for gestational age.  Pain/Pressure: Absent     Pelvic: Cervical exam deferred        Extremities: Normal range of motion.  Edema: Trace  Mental Status: Normal mood and affect. Normal behavior. Normal judgment and thought content.   Assessment and Plan:  Pregnancy: G3P0020 at [redacted]w[redacted]d  1. Supervision of other normal pregnancy, antepartum  - Doing well.   2. Low-lying placenta  No bleeding  Pelvic rest Any bleeding go to MAU F/U scheduled for 10/22  3. Von Willebrand disease (HCC)  Will schedule next visit with MD to discuss labs. Labs look good and are essentially negative.    Preterm labor symptoms and general  obstetric precautions including but not limited to vaginal bleeding, contractions, leaking of fluid and fetal movement were reviewed in detail with the patient. Please refer to After Visit Summary for other counseling recommendations.  Return in about 2 weeks (around 07/26/2018) for Schedule with MD only please, High risk .  Future Appointments  Date Time Provider Department Center  07/26/2018  9:15 AM Hermina Staggers, MD [facility] WOC  07/31/2018  8:00 AM [facility] [place] 3 WH-MFCUS MFC-US    Venia Carbon, NP

## 2018-07-13 LAB — GLUCOSE TOLERANCE, 2 HOURS W/ 1HR
GLUCOSE, 1 HOUR: 159 mg/dL (ref 65–179)
GLUCOSE, FASTING: 80 mg/dL (ref 65–91)
Glucose, 2 hour: 106 mg/dL (ref 65–152)

## 2018-07-13 LAB — CBC
Hematocrit: 36.7 % (ref 34.0–46.6)
Hemoglobin: 12.5 g/dL (ref 11.1–15.9)
MCH: 30.4 pg (ref 26.6–33.0)
MCHC: 34.1 g/dL (ref 31.5–35.7)
MCV: 89 fL (ref 79–97)
Platelets: 330 10*3/uL (ref 150–450)
RBC: 4.11 x10E6/uL (ref 3.77–5.28)
RDW: 12.2 % — ABNORMAL LOW (ref 12.3–15.4)
WBC: 12.5 10*3/uL — ABNORMAL HIGH (ref 3.4–10.8)

## 2018-07-13 LAB — RPR: RPR: NONREACTIVE

## 2018-07-13 LAB — HIV ANTIBODY (ROUTINE TESTING W REFLEX): HIV Screen 4th Generation wRfx: NONREACTIVE

## 2018-07-18 ENCOUNTER — Other Ambulatory Visit: Payer: Self-pay

## 2018-07-18 ENCOUNTER — Telehealth: Payer: Self-pay | Admitting: *Deleted

## 2018-07-18 ENCOUNTER — Inpatient Hospital Stay (HOSPITAL_COMMUNITY)
Admission: AD | Admit: 2018-07-18 | Discharge: 2018-07-18 | Disposition: A | Discharge status: Home / Self Care | Payer: Medicaid Other | Source: Ambulatory Visit | Attending: Obstetrics & Gynecology | Admitting: Obstetrics & Gynecology

## 2018-07-18 ENCOUNTER — Encounter (HOSPITAL_COMMUNITY): Payer: Self-pay

## 2018-07-18 DIAGNOSIS — O9989 Other specified diseases and conditions complicating pregnancy, childbirth and the puerperium: Secondary | ICD-10-CM

## 2018-07-18 DIAGNOSIS — O26893 Other specified pregnancy related conditions, third trimester: Secondary | ICD-10-CM | POA: Diagnosis not present

## 2018-07-18 DIAGNOSIS — O99619 Diseases of the digestive system complicating pregnancy, unspecified trimester: Secondary | ICD-10-CM

## 2018-07-18 DIAGNOSIS — K5900 Constipation, unspecified: Secondary | ICD-10-CM | POA: Insufficient documentation

## 2018-07-18 DIAGNOSIS — Z3A30 30 weeks gestation of pregnancy: Secondary | ICD-10-CM | POA: Insufficient documentation

## 2018-07-18 DIAGNOSIS — M549 Dorsalgia, unspecified: Secondary | ICD-10-CM

## 2018-07-18 DIAGNOSIS — M545 Low back pain: Secondary | ICD-10-CM | POA: Diagnosis present

## 2018-07-18 LAB — URINALYSIS, ROUTINE W REFLEX MICROSCOPIC
Bilirubin Urine: NEGATIVE
Glucose, UA: NEGATIVE mg/dL
Hgb urine dipstick: NEGATIVE
Ketones, ur: 5 mg/dL — AB
Nitrite: NEGATIVE
PH: 6 (ref 5.0–8.0)
Protein, ur: NEGATIVE mg/dL
Specific Gravity, Urine: 1.018 (ref 1.005–1.030)

## 2018-07-18 MED ORDER — CYCLOBENZAPRINE HCL 10 MG PO TABS: 10.0000 mg | ORAL_TABLET | Freq: Two times a day (BID) | ORAL | 0 refills | Status: DC | PRN

## 2018-07-18 NOTE — MAU Provider Note (Signed)
History     CSN: [id number]  Arrival date and time: 07/18/18 8657   First Provider Initiated Contact with Patient 07/18/18 1926      Chief Complaint  Patient presents with  . Back Pain   HPI   [name] is a 27 y.o. female G3P0020 @ [redacted]w[redacted]d here in MAU with complaints of lower back pain. The pain started in her lower left back and radiates to the middle. No urinary complaints. The pain started today and has been present all day. She recently changed jobs on Monday; she went from a standing to sitting position at work. This is the only thing she can recall that may be a change from her daily routine. + fetal movement. No bleeding.   OB History    Gravida  3   Para      Term      Preterm      AB  2   Living        SAB  2   TAB      Ectopic      Multiple      Live Births              Past Medical History:  Diagnosis Date  . Anemia   . Anxiety   . Asthma    exercise induced  . Complication of anesthesia   . Head injury, closed    scalp laceration, dizzy for a few minutes  . History of chlamydia   . PONV (postoperative nausea and vomiting)   . Von Willebrand disease (HCC)     Past Surgical History:  Procedure Laterality Date  . HAND SURGERY Left 2009   thumb , fracture  . HAND SURGERY Left 2006   Pins placed  . HARDWARE REMOVAL Left 02/10/2016   Procedure: LEFT SMALL FINGER DEEP IMPLANT REMOVAL;  Surgeon: Bradly Bienenstock, MD;  Location: [facility] OR;  Service: Orthopedics;  Laterality: Left;  . OPEN REDUCTION INTERNAL FIXATION (ORIF) PROXIMAL PHALANX Left 01/06/2016   Procedure: OPEN REDUCTION INTERNAL FIXATION (ORIF) LEFT SMALL FINGER;  Surgeon: Bradly Bienenstock, MD;  Location: [facility] OR;  Service: Orthopedics;  Laterality: Left;    Family History  Problem Relation Age of Onset  . Diabetes Father   . Hypertension Father   . Hypercholesterolemia Father   . Heart disease Father   . Cancer Paternal Grandfather   . Cancer Paternal Grandmother     Social  History   Tobacco Use  . Smoking status: Never Smoker  . Smokeless tobacco: Never Used  Substance Use Topics  . Alcohol use: Not Currently    Comment: Currently no drinking due to pregnancy  . Drug use: No    Allergies:  Allergies  Allergen Reactions  . Bee Venom Anaphylaxis  . Adhesive [Tape] Itching and Rash    Medications Prior to Admission  Medication Sig Dispense Refill Last Dose  . acetaminophen (TYLENOL) 325 MG tablet Take 650 mg by mouth as needed.   Taking  . albuterol (PROVENTIL HFA;VENTOLIN HFA) 108 (90 Base) MCG/ACT inhaler Inhale 1-2 puffs into the lungs every 6 (six) hours as needed for wheezing or shortness of breath.    Taking  . EPINEPHrine 0.3 mg/0.3 mL IJ SOAJ injection Inject 0.3 mLs (0.3 mg total) into the muscle once as needed. As needed for bee stings 2 Device 0 Taking  . ferrous fumarate (HEMOCYTE - 106 MG FE) 325 (106 Fe) MG TABS tablet Take 1 tablet (106 mg of iron total) by  mouth 2 (two) times daily. (Patient not taking: Reported on 07/12/2018) 30 each 0 Not Taking  . Prenatal Vit-Fe Fumarate-FA (MULTIVITAMIN-PRENATAL) 27-0.8 MG TABS tablet Take 1 tablet by mouth daily at 12 noon.   Taking   Results for orders placed or performed during the hospital encounter of 07/18/18 (from the past 48 hour(s))  Urinalysis, Routine w reflex microscopic     Status: Abnormal   Collection Time: 07/18/18  6:52 PM  Result Value Ref Range   Color, Urine YELLOW YELLOW   APPearance CLEAR CLEAR   Specific Gravity, Urine 1.018 1.005 - 1.030   pH 6.0 5.0 - 8.0   Glucose, UA NEGATIVE NEGATIVE mg/dL   Hgb urine dipstick NEGATIVE NEGATIVE   Bilirubin Urine NEGATIVE NEGATIVE   Ketones, ur 5 (A) NEGATIVE mg/dL   Protein, ur NEGATIVE NEGATIVE mg/dL   Nitrite NEGATIVE NEGATIVE   Leukocytes, UA TRACE (A) NEGATIVE   RBC / HPF 0-5 0 - 5 RBC/hpf   WBC, UA 0-5 0 - 5 WBC/hpf   Bacteria, UA RARE (A) NONE SEEN   Squamous Epithelial / LPF 0-5 0 - 5   Mucus PRESENT     Comment:  Performed at [facility], [address] [place], [place] [address]   Review of Systems  Constitutional: Negative for fever.  Gastrointestinal: Negative for abdominal pain.  Genitourinary: Negative for dysuria, flank pain, vaginal bleeding and vaginal discharge.   Physical Exam   Blood pressure 105/62, pulse (!) 102, temperature 98.3 F (36.8 C), temperature source Oral, resp. rate 18, height 5\' 3"  (1.6 m), weight 93.4 kg, last menstrual period 12/16/2017, unknown if currently breastfeeding.  Physical Exam  Constitutional: She is oriented to person, place, and time. She appears well-developed and well-nourished. No distress.  HENT:  Head: Normocephalic.  Eyes: Pupils are equal, round, and reactive to light.  Neck: Neck supple.  Respiratory: Effort normal.  GI: Soft. She exhibits no distension. There is no tenderness.  Genitourinary:  Genitourinary Comments: gentle bimanual exam Closed, thick, anterior   Musculoskeletal: Normal range of motion.       Lumbar back: She exhibits tenderness. She exhibits normal range of motion and no edema.       Arms: Neurological: She is alert and oriented to person, place, and time.  Skin: Skin is warm. She is not diaphoretic.  Psychiatric: Her behavior is normal.   Fetal Tracing: Baseline: 125 bpm Variability: Moderate  Accelerations: 15x15 Decelerations: None Toco: None  MAU Course  Procedures  None  MDM  UA  Urine culture, no urinary complaints at this time. .  Assessment and Plan   A:  Problem List Items Addressed This Visit    None    Visit Diagnoses    Back pain affecting pregnancy in third trimester    -  Primary   [redacted] weeks gestation of pregnancy       Constipation during pregnancy, antepartum          P:  Discharge home in stable condition Rx: Flexeril Change positions at work Heat, and ice alternate    [name] I, NP 07/19/2018 4:50 PM

## 2018-07-18 NOTE — MAU Note (Signed)
Pt presents to MAU with c/o back pain that is shooting down left leg, this started today around 11 am. Pt denies VB, LOF and urinary symptoms. +FM

## 2018-07-18 NOTE — Discharge Instructions (Signed)

## 2018-07-18 NOTE — Telephone Encounter (Signed)
Pt calling to report that she has begun to have some minor lower back pain that occasionally comes around to her lower abdomen and is dull and achy and she wants to know if this is a cause for concern.

## 2018-07-19 NOTE — Telephone Encounter (Signed)
Called patient, no answer- left message stating we are trying to reach you to return your phone call, please call us back if you still need assistance.  

## 2018-07-20 LAB — CULTURE, OB URINE: SPECIAL REQUESTS: NORMAL

## 2018-07-23 ENCOUNTER — Other Ambulatory Visit (HOSPITAL_COMMUNITY)
Admission: RE | Admit: 2018-07-23 | Discharge: 2018-07-23 | Disposition: A | Discharge status: Home / Self Care | Payer: Medicaid Other | Source: Ambulatory Visit | Attending: Family Medicine | Admitting: Family Medicine

## 2018-07-23 ENCOUNTER — Ambulatory Visit (INDEPENDENT_AMBULATORY_CARE_PROVIDER_SITE_OTHER): Payer: Medicaid Other | Admitting: Family Medicine

## 2018-07-23 VITALS — BP 103/58 | HR 80 | Wt 208.9 lb

## 2018-07-23 DIAGNOSIS — N898 Other specified noninflammatory disorders of vagina: Secondary | ICD-10-CM | POA: Diagnosis not present

## 2018-07-23 NOTE — Patient Instructions (Signed)
Eye Redness: Try lubricating/moisturizing drops like Refresh or Zatidor (anti-histamine drops) Rash: Try hydrocortisone cream if you develop itching.

## 2018-07-23 NOTE — Progress Notes (Addendum)
Came into walk-in clinic. Partner told her she needs to be tested due to some symptoms he is having. Is having some white d/c which is fairly new. No odor or itching. Also has red rash on R lower leg that has recently appeared. Denies any itching or pain at area. Has some raised, red bumps but not drainage. Left eye is red and was draining earlier today. No itching. Had pictures yest and wore makeup and thinks maybe that irritated eye. FHR 135 by doppler. Discussed with Dr L. Earlene Plater and she will see pt.

## 2018-07-23 NOTE — Progress Notes (Signed)
   ACUTE OFFICE VISIT NOTE Subjective:  [name] is a 27 y.o. G3P0020 at [redacted]w[redacted]d being seen today for acute visit, receives her prenatal care here for STI check. Here today with her partner who had intercourse with another person and was having some burning with urination. He has been checked for STIs both last week and this week and those tests have been negative. Patient states she has had no symptoms.   Has noticed eye redness and rash on leg. Woke up with redness in left eye. Wore false eyelashes and makeup yesterday for work which she normally doesn't do. Woke up with red eye and watery discharge. No itching/pain with movements.  Also rash appeared a couple of days ago on right shin. Few scattered bumps, red, not itchy. Some pain if touched. Hasn't tried anything for it.   Objective:   Vitals:   07/23/18 1801  BP: (!) 103/58  Pulse: 80  Weight: 208 lb 14.4 oz (94.8 kg)   Fetal Status: FHR 135  General:  Alert, oriented and cooperative. Patient is in no acute distress. Left eye with injected conjunctivae.  Skin: Skin is warm and dry. Erythematous papules on right shin. Mild TTP. No surrounding erythema.   Cardiovascular: Normal heart rate noted  Respiratory: Normal respiratory effort, no problems with respiration noted  Abdomen: Soft, gravid, appropriate for gestational age.        Pelvic: Normal appearing vagina, cervix. Moderate amount of white/yellow, creamy discharge noted in vault.  Extremities: Normal range of motion.     Mental Status: Normal mood and affect. Normal behavior. Normal judgment and thought content.   Assessment and Plan:  G3P0020 at [redacted]w[redacted]d here for STI check given partner had intercourse with another person with unknown status. Partner here today and has been negative of STI checks.   STI Screening: Had HIV, RPR testing recently.  - Cervicovaginal ancillary only, counseled will call with results  Conjunctivitis: Recommended artificial tears, anti-histamine  drops.  Rash: Unclear etiology. Looks most consistent with bug bites. Not having any joint pains or painful urination so Reiter's syndrome unlikely. Recommended hydrocortisone cream prn and keeping follow-up in [facility] this week for routine prenatal.   Please refer to After Visit Summary for other counseling recommendations.  Return in about 3 days (around 07/26/2018) for regularly scheduled visit .  Future Appointments  Date Time Provider Department Center  07/26/2018  9:15 AM Hermina Staggers, MD [facility]  07/31/2018  8:00 AM [facility] [place] 3 [facility]   Tamera Stands, DO

## 2018-07-25 LAB — CERVICOVAGINAL ANCILLARY ONLY
Bacterial vaginitis: NEGATIVE
Candida vaginitis: NEGATIVE
Chlamydia: NEGATIVE
Neisseria Gonorrhea: NEGATIVE
Trichomonas: NEGATIVE

## 2018-07-26 ENCOUNTER — Ambulatory Visit (INDEPENDENT_AMBULATORY_CARE_PROVIDER_SITE_OTHER): Payer: Medicaid Other | Admitting: Obstetrics and Gynecology

## 2018-07-26 ENCOUNTER — Encounter: Payer: Self-pay | Admitting: Obstetrics and Gynecology

## 2018-07-26 VITALS — BP 113/68 | HR 89 | Wt 211.4 lb

## 2018-07-26 DIAGNOSIS — Z3480 Encounter for supervision of other normal pregnancy, unspecified trimester: Secondary | ICD-10-CM

## 2018-07-26 DIAGNOSIS — Z3483 Encounter for supervision of other normal pregnancy, third trimester: Secondary | ICD-10-CM

## 2018-07-26 DIAGNOSIS — Z202 Contact with and (suspected) exposure to infections with a predominantly sexual mode of transmission: Secondary | ICD-10-CM | POA: Diagnosis not present

## 2018-07-26 DIAGNOSIS — Z23 Encounter for immunization: Secondary | ICD-10-CM

## 2018-07-26 DIAGNOSIS — D6800 Von Willebrand disease, unspecified: Secondary | ICD-10-CM

## 2018-07-26 DIAGNOSIS — O4440 Low lying placenta NOS or without hemorrhage, unspecified trimester: Secondary | ICD-10-CM | POA: Diagnosis not present

## 2018-07-26 NOTE — Progress Notes (Signed)
Subjective:  [name] is a 27 y.o. G3P0020 at [redacted]w[redacted]d being seen today for ongoing prenatal care.  She is currently monitored for the following issues for this high-risk pregnancy and has Von Willebrand disease (HCC); History of multiple miscarriages; Supervision of other normal pregnancy, antepartum; and Low-lying placenta on their problem list.  Patient reports general discomforts of pregnancy.  Contractions: Irritability. Vag. Bleeding: None.  Movement: Present. Denies leaking of fluid.   The following portions of the patient's history were reviewed and updated as appropriate: allergies, current medications, past family history, past medical history, past social history, past surgical history and problem list. Problem list updated.  Objective:   Vitals:   07/26/18 0922  BP: 113/68  Pulse: 89  Weight: 211 lb 6.4 oz (95.9 kg)    Fetal Status: Fetal Heart Rate (bpm): 145   Movement: Present     General:  Alert, oriented and cooperative. Patient is in no acute distress.  Skin: Skin is warm and dry. No rash noted.   Cardiovascular: Normal heart rate noted  Respiratory: Normal respiratory effort, no problems with respiration noted  Abdomen: Soft, gravid, appropriate for gestational age. Pain/Pressure: Present     Pelvic:  Cervical exam deferred        Extremities: Normal range of motion.  Edema: Trace  Mental Status: Normal mood and affect. Normal behavior. Normal judgment and thought content.   Urinalysis:      Assessment and Plan:  Pregnancy: G3P0020 at [redacted]w[redacted]d  1. Supervision of other normal pregnancy, antepartum Stable - Tdap vaccine greater than or equal to 7yo IM  2. Possible exposure to STD  - Herpes simplex virus(hsv) dna by pcr  3. Low-lying placenta No vaginal bleeding Continue with pelvic rest F/U U/S next week  4. Von Willebrand disease (HCC) Has seen Heme/Onc and had follow up testing No evidence of Dz Pt reassured  Preterm labor symptoms and general  obstetric precautions including but not limited to vaginal bleeding, contractions, leaking of fluid and fetal movement were reviewed in detail with the patient. Please refer to After Visit Summary for other counseling recommendations.  Return in about 2 weeks (around 08/09/2018) for OB visit.   Hermina Staggers, MD

## 2018-07-26 NOTE — Patient Instructions (Signed)
Pelvic Rest Pelvic rest may be recommended if:  Your placenta is partially or completely covering the opening of your cervix (placenta previa).  There is bleeding between the wall of the uterus and the amniotic sac in the first trimester of pregnancy (subchorionic hemorrhage).  You went into labor too early (preterm labor).  Based on your overall health and the health of your baby, your health care provider will decide if pelvic rest is right for you. How do I rest my pelvis? For as long as told by your health care provider:  Do not have sex, sexual stimulation, or an orgasm.  Do not use tampons. Do not douche. Do not put anything in your vagina.  Do not lift anything that is heavier than 10 lb (4.5 kg).  Avoid activities that take a lot of effort (are strenuous).  Avoid any activity in which your pelvic muscles could become strained.  When should I seek medical care? Seek medical care if you have:  Cramping pain in your lower abdomen.  Vaginal discharge.  A low, dull backache.  Regular contractions.  Uterine tightening.  When should I seek immediate medical care? Seek immediate medical care if:  You have vaginal bleeding and you are pregnant.  This information is not intended to replace advice given to you by your health care provider. Make sure you discuss any questions you have with your health care provider. Document Released: 01/21/2011 Document Revised: 03/03/2016 Document Reviewed: 03/30/2015 Elsevier Interactive Patient Education  2018 [employer]. Third Trimester of Pregnancy The third trimester is from week 28 through week 40 (months 7 through 9). The third trimester is a time when the unborn baby (fetus) is growing rapidly. At the end of the ninth month, the fetus is about 20 inches in length and weighs 6-10 pounds. Body changes during your third trimester Your body will continue to go through many changes during pregnancy. The changes vary from woman to  woman. During the third trimester:  Your weight will continue to increase. You can expect to gain 25-35 pounds (11-16 kg) by the end of the pregnancy.  You may begin to get stretch marks on your hips, abdomen, and breasts.  You may urinate more often because the fetus is moving lower into your pelvis and pressing on your bladder.  You may develop or continue to have heartburn. This is caused by increased hormones that slow down muscles in the digestive tract.  You may develop or continue to have constipation because increased hormones slow digestion and cause the muscles that push waste through your intestines to relax.  You may develop hemorrhoids. These are swollen veins (varicose veins) in the rectum that can itch or be painful.  You may develop swollen, bulging veins (varicose veins) in your legs.  You may have increased body aches in the pelvis, back, or thighs. This is due to weight gain and increased hormones that are relaxing your joints.  You may have changes in your hair. These can include thickening of your hair, rapid growth, and changes in texture. Some women also have hair loss during or after pregnancy, or hair that feels dry or thin. Your hair will most likely return to normal after your baby is born.  Your breasts will continue to grow and they will continue to become tender. A yellow fluid (colostrum) may leak from your breasts. This is the first milk you are producing for your baby.  Your belly button may stick out.  You may notice more  swelling in your hands, face, or ankles.  You may have increased tingling or numbness in your hands, arms, and legs. The skin on your belly may also feel numb.  You may feel short of breath because of your expanding uterus.  You may have more problems sleeping. This can be caused by the size of your belly, increased need to urinate, and an increase in your body's metabolism.  You may notice the fetus "dropping," or moving lower in  your abdomen (lightening).  You may have increased vaginal discharge.  You may notice your joints feel loose and you may have pain around your pelvic bone.  What to expect at prenatal visits You will have prenatal exams every 2 weeks until week 36. Then you will have weekly prenatal exams. During a routine prenatal visit:  You will be weighed to make sure you and the baby are growing normally.  Your blood pressure will be taken.  Your abdomen will be measured to track your baby's growth.  The fetal heartbeat will be listened to.  Any test results from the previous visit will be discussed.  You may have a cervical check near your due date to see if your cervix has softened or thinned (effaced).  You will be tested for Group B streptococcus. This happens between 35 and 37 weeks.  Your health care provider may ask you:  What your birth plan is.  How you are feeling.  If you are feeling the baby move.  If you have had any abnormal symptoms, such as leaking fluid, bleeding, severe headaches, or abdominal cramping.  If you are using any tobacco products, including cigarettes, chewing tobacco, and electronic cigarettes.  If you have any questions.  Other tests or screenings that may be performed during your third trimester include:  Blood tests that check for low iron levels (anemia).  Fetal testing to check the health, activity level, and growth of the fetus. Testing is done if you have certain medical conditions or if there are problems during the pregnancy.  Nonstress test (NST). This test checks the health of your baby to make sure there are no signs of problems, such as the baby not getting enough oxygen. During this test, a belt is placed around your belly. The baby is made to move, and its heart rate is monitored during movement.  What is false labor? False labor is a condition in which you feel small, irregular tightenings of the muscles in the womb (contractions) that  usually go away with rest, changing position, or drinking water. These are called Braxton Hicks contractions. Contractions may last for hours, days, or even weeks before true labor sets in. If contractions come at regular intervals, become more frequent, increase in intensity, or become painful, you should see your health care provider. What are the signs of labor?  Abdominal cramps.  Regular contractions that start at 10 minutes apart and become stronger and more frequent with time.  Contractions that start on the top of the uterus and spread down to the lower abdomen and back.  Increased pelvic pressure and dull back pain.  A watery or bloody mucus discharge that comes from the vagina.  Leaking of amniotic fluid. This is also known as your "water breaking." It could be a slow trickle or a gush. Let your health care provider know if it has a color or strange odor. If you have any of these signs, call your health care provider right away, even if it is before  your due date. Follow these instructions at home: Medicines  Follow your health care provider's instructions regarding medicine use. Specific medicines may be either safe or unsafe to take during pregnancy.  Take a prenatal vitamin that contains at least 600 micrograms (mcg) of folic acid.  If you develop constipation, try taking a stool softener if your health care provider approves. Eating and drinking  Eat a balanced diet that includes fresh fruits and vegetables, whole grains, good sources of protein such as meat, eggs, or tofu, and low-fat dairy. Your health care provider will help you determine the amount of weight gain that is right for you.  Avoid raw meat and uncooked cheese. These carry germs that can cause birth defects in the baby.  If you have low calcium intake from food, talk to your health care provider about whether you should take a daily calcium supplement.  Eat four or five small meals rather than three large  meals a day.  Limit foods that are high in fat and processed sugars, such as fried and sweet foods.  To prevent constipation: ? Drink enough fluid to keep your urine clear or pale yellow. ? Eat foods that are high in fiber, such as fresh fruits and vegetables, whole grains, and beans. Activity  Exercise only as directed by your health care provider. Most women can continue their usual exercise routine during pregnancy. Try to exercise for 30 minutes at least 5 days a week. Stop exercising if you experience uterine contractions.  Avoid heavy lifting.  Do not exercise in extreme heat or humidity, or at high altitudes.  Wear low-heel, comfortable shoes.  Practice good posture.  You may continue to have sex unless your health care provider tells you otherwise. Relieving pain and discomfort  Take frequent breaks and rest with your legs elevated if you have leg cramps or low back pain.  Take warm sitz baths to soothe any pain or discomfort caused by hemorrhoids. Use hemorrhoid cream if your health care provider approves.  Wear a good support bra to prevent discomfort from breast tenderness.  If you develop varicose veins: ? Wear support pantyhose or compression stockings as told by your healthcare provider. ? Elevate your feet for 15 minutes, 3-4 times a day. Prenatal care  Write down your questions. Take them to your prenatal visits.  Keep all your prenatal visits as told by your health care provider. This is important. Safety  Wear your seat belt at all times when driving.  Make a list of emergency phone numbers, including numbers for family, friends, the hospital, and police and fire departments. General instructions  Avoid cat litter boxes and soil used by cats. These carry germs that can cause birth defects in the baby. If you have a cat, ask someone to clean the litter box for you.  Do not travel far distances unless it is absolutely necessary and only with the approval  of your health care provider.  Do not use hot tubs, steam rooms, or saunas.  Do not drink alcohol.  Do not use any products that contain nicotine or tobacco, such as cigarettes and e-cigarettes. If you need help quitting, ask your health care provider.  Do not use any medicinal herbs or unprescribed drugs. These chemicals affect the formation and growth of the baby.  Do not douche or use tampons or scented sanitary pads.  Do not cross your legs for long periods of time.  To prepare for the arrival of your baby: ? Take prenatal  classes to understand, practice, and ask questions about labor and delivery. ? Make a trial run to the hospital. ? Visit the hospital and tour the maternity area. ? Arrange for maternity or paternity leave through employers. ? Arrange for family and friends to take care of pets while you are in the hospital. ? Purchase a rear-facing car seat and make sure you know how to install it in your car. ? Pack your hospital bag. ? Prepare the baby's nursery. Make sure to remove all pillows and stuffed animals from the baby's crib to prevent suffocation.  Visit your dentist if you have not gone during your pregnancy. Use a soft toothbrush to brush your teeth and be gentle when you floss. Contact a health care provider if:  You are unsure if you are in labor or if your water has broken.  You become dizzy.  You have mild pelvic cramps, pelvic pressure, or nagging pain in your abdominal area.  You have lower back pain.  You have persistent nausea, vomiting, or diarrhea.  You have an unusual or bad smelling vaginal discharge.  You have pain when you urinate. Get help right away if:  Your water breaks before 37 weeks.  You have regular contractions less than 5 minutes apart before 37 weeks.  You have a fever.  You are leaking fluid from your vagina.  You have spotting or bleeding from your vagina.  You have severe abdominal pain or cramping.  You have  rapid weight loss or weight gain.  You have shortness of breath with chest pain.  You notice sudden or extreme swelling of your face, hands, ankles, feet, or legs.  Your baby makes fewer than 10 movements in 2 hours.  You have severe headaches that do not go away when you take medicine.  You have vision changes. Summary  The third trimester is from week 28 through week 40, months 7 through 9. The third trimester is a time when the unborn baby (fetus) is growing rapidly.  During the third trimester, your discomfort may increase as you and your baby continue to gain weight. You may have abdominal, leg, and back pain, sleeping problems, and an increased need to urinate.  During the third trimester your breasts will keep growing and they will continue to become tender. A yellow fluid (colostrum) may leak from your breasts. This is the first milk you are producing for your baby.  False labor is a condition in which you feel small, irregular tightenings of the muscles in the womb (contractions) that eventually go away. These are called Braxton Hicks contractions. Contractions may last for hours, days, or even weeks before true labor sets in.  Signs of labor can include: abdominal cramps; regular contractions that start at 10 minutes apart and become stronger and more frequent with time; watery or bloody mucus discharge that comes from the vagina; increased pelvic pressure and dull back pain; and leaking of amniotic fluid. This information is not intended to replace advice given to you by your health care provider. Make sure you discuss any questions you have with your health care provider. Document Released: 09/20/2001 Document Revised: 03/03/2016 Document Reviewed: 11/27/2012 Elsevier Interactive Patient Education  2017 [employer].

## 2018-07-26 NOTE — Addendum Note (Signed)
Addended by: Henrietta Dine on: 07/26/2018 10:01 AM   Modules accepted: Orders

## 2018-07-29 LAB — HSV TYPE I/II IGG, IGMW/ REFLEX
HSV 1 IgM: <1:10 {titer}
HSV 2 IgG, Type Spec: <0.91 index (ref 0.00–0.90)

## 2018-07-31 ENCOUNTER — Ambulatory Visit (HOSPITAL_COMMUNITY)
Admission: RE | Admit: 2018-07-31 | Discharge: 2018-07-31 | Disposition: A | Discharge status: Home / Self Care | Payer: Medicaid Other | Source: Ambulatory Visit | Attending: Student | Admitting: Student

## 2018-07-31 DIAGNOSIS — E669 Obesity, unspecified: Secondary | ICD-10-CM | POA: Diagnosis not present

## 2018-07-31 DIAGNOSIS — O2693 Pregnancy related conditions, unspecified, third trimester: Secondary | ICD-10-CM | POA: Diagnosis not present

## 2018-07-31 DIAGNOSIS — O4443 Low lying placenta NOS or without hemorrhage, third trimester: Secondary | ICD-10-CM | POA: Diagnosis not present

## 2018-07-31 DIAGNOSIS — Z3A32 32 weeks gestation of pregnancy: Secondary | ICD-10-CM | POA: Diagnosis not present

## 2018-07-31 DIAGNOSIS — O99213 Obesity complicating pregnancy, third trimester: Secondary | ICD-10-CM | POA: Diagnosis not present

## 2018-07-31 DIAGNOSIS — O4403 Placenta previa specified as without hemorrhage, third trimester: Secondary | ICD-10-CM

## 2018-07-31 DIAGNOSIS — O3663X0 Maternal care for excessive fetal growth, third trimester, not applicable or unspecified: Secondary | ICD-10-CM | POA: Insufficient documentation

## 2018-08-01 ENCOUNTER — Other Ambulatory Visit (HOSPITAL_COMMUNITY): Payer: Self-pay | Admitting: *Deleted

## 2018-08-01 DIAGNOSIS — Z362 Encounter for other antenatal screening follow-up: Secondary | ICD-10-CM

## 2018-08-02 ENCOUNTER — Inpatient Hospital Stay (HOSPITAL_COMMUNITY)
Admission: AD | Admit: 2018-08-02 | Discharge: 2018-08-02 | Disposition: A | Discharge status: Home / Self Care | Payer: Medicaid Other | Source: Ambulatory Visit | Attending: Obstetrics and Gynecology | Admitting: Obstetrics and Gynecology

## 2018-08-02 ENCOUNTER — Encounter (HOSPITAL_COMMUNITY): Payer: Self-pay | Admitting: *Deleted

## 2018-08-02 DIAGNOSIS — O26893 Other specified pregnancy related conditions, third trimester: Secondary | ICD-10-CM | POA: Insufficient documentation

## 2018-08-02 DIAGNOSIS — O9989 Other specified diseases and conditions complicating pregnancy, childbirth and the puerperium: Secondary | ICD-10-CM

## 2018-08-02 DIAGNOSIS — O99891 Other specified diseases and conditions complicating pregnancy: Secondary | ICD-10-CM

## 2018-08-02 DIAGNOSIS — O4443 Low lying placenta NOS or without hemorrhage, third trimester: Secondary | ICD-10-CM

## 2018-08-02 DIAGNOSIS — Z6790 Unspecified blood type, Rh positive: Secondary | ICD-10-CM

## 2018-08-02 DIAGNOSIS — Z3A32 32 weeks gestation of pregnancy: Secondary | ICD-10-CM | POA: Diagnosis not present

## 2018-08-02 DIAGNOSIS — O4440 Low lying placenta NOS or without hemorrhage, unspecified trimester: Secondary | ICD-10-CM

## 2018-08-02 DIAGNOSIS — O26853 Spotting complicating pregnancy, third trimester: Secondary | ICD-10-CM | POA: Diagnosis present

## 2018-08-02 DIAGNOSIS — Z3480 Encounter for supervision of other normal pregnancy, unspecified trimester: Secondary | ICD-10-CM

## 2018-08-02 DIAGNOSIS — M549 Dorsalgia, unspecified: Secondary | ICD-10-CM | POA: Diagnosis not present

## 2018-08-02 DIAGNOSIS — Z3689 Encounter for other specified antenatal screening: Secondary | ICD-10-CM

## 2018-08-02 LAB — WET PREP, GENITAL
CLUE CELLS WET PREP: NONE SEEN
Sperm: NONE SEEN
Trich, Wet Prep: NONE SEEN
YEAST WET PREP: NONE SEEN

## 2018-08-02 LAB — URINALYSIS, ROUTINE W REFLEX MICROSCOPIC
Bilirubin Urine: NEGATIVE
Glucose, UA: NEGATIVE mg/dL
KETONES UR: NEGATIVE mg/dL
Nitrite: NEGATIVE
PROTEIN: NEGATIVE mg/dL
Specific Gravity, Urine: 1.01 (ref 1.005–1.030)
pH: 7 (ref 5.0–8.0)

## 2018-08-02 MED ORDER — CYCLOBENZAPRINE HCL 10 MG PO TABS
10.0000 mg | ORAL_TABLET | Freq: Three times a day (TID) | ORAL | Status: DC | PRN
  Administered 2018-08-02: 10 mg via ORAL
  Filled 2018-08-02: qty 1

## 2018-08-02 NOTE — MAU Note (Signed)
Pt reports bilateral sharp upper abd pain since this am. Also reports some pink discharge on tissue when she wiped this am. ? Low lying placenta

## 2018-08-02 NOTE — MAU Provider Note (Signed)
History     CSN: [id number]  Arrival date and time: 08/02/18 4098   First Provider Initiated Contact with Patient 08/02/18 [id number]      Chief Complaint  Patient presents with  . Abdominal Pain  . Vaginal Bleeding  . Back Pain   G3P0020 @32 .5 wks here with spotting. She saw pink blood on the toilet paper when she wiped this am. No recent IC. She has not been sexually active in almost a month. Denies LOF. Having some thin white vaginal discharge without odor or itching. Having irregular BH ctx. Good FM. Also endorses ongoing LBP for the last few weeks. Pain feels achy and rates 4/10. Uses Flexeril but didn't take any today. She admits to carrying a heavy backpack daily to school. Had hx of low-lying placenta that resolved as of 07/21/18.  OB History    Gravida  3   Para      Term      Preterm      AB  2   Living        SAB  2   TAB      Ectopic      Multiple      Live Births              Past Medical History:  Diagnosis Date  . Anemia   . Anxiety   . Asthma    exercise induced  . Complication of anesthesia   . Head injury, closed    scalp laceration, dizzy for a few minutes  . History of chlamydia   . PONV (postoperative nausea and vomiting)   . Von Willebrand disease (HCC)     Past Surgical History:  Procedure Laterality Date  . HAND SURGERY Left 2009   thumb , fracture  . HAND SURGERY Left 2006   Pins placed  . HARDWARE REMOVAL Left 02/10/2016   Procedure: LEFT SMALL FINGER DEEP IMPLANT REMOVAL;  Surgeon: Bradly Bienenstock, MD;  Location: [facility] OR;  Service: Orthopedics;  Laterality: Left;  . OPEN REDUCTION INTERNAL FIXATION (ORIF) PROXIMAL PHALANX Left 01/06/2016   Procedure: OPEN REDUCTION INTERNAL FIXATION (ORIF) LEFT SMALL FINGER;  Surgeon: Bradly Bienenstock, MD;  Location: [facility] OR;  Service: Orthopedics;  Laterality: Left;    Family History  Problem Relation Age of Onset  . Diabetes Father   . Hypertension Father   . Hypercholesterolemia Father   . Heart  disease Father   . Cancer Paternal Grandfather   . Cancer Paternal Grandmother     Social History   Tobacco Use  . Smoking status: Never Smoker  . Smokeless tobacco: Never Used  Substance Use Topics  . Alcohol use: Not Currently    Comment: Currently no drinking due to pregnancy  . Drug use: No    Allergies:  Allergies  Allergen Reactions  . Bee Venom Anaphylaxis  . Adhesive [Tape] Itching and Rash    Medications Prior to Admission  Medication Sig Dispense Refill Last Dose  . acetaminophen (TYLENOL) 325 MG tablet Take 650 mg by mouth as needed.   Past Week at Unknown time  . cyclobenzaprine (FLEXERIL) 10 MG tablet Take 1 tablet (10 mg total) by mouth 2 (two) times daily as needed for muscle spasms. 20 tablet 0 Past Week at Unknown time  . ferrous fumarate (HEMOCYTE - 106 MG FE) 325 (106 Fe) MG TABS tablet Take 1 tablet (106 mg of iron total) by mouth 2 (two) times daily. 30 each 0 Past Month at Unknown time  .  Prenatal Vit-Fe Fumarate-FA (PRENATAL MULTIVITAMIN) TABS tablet Take 1 tablet by mouth at bedtime.   08/01/2018 at Unknown time  . albuterol (PROVENTIL HFA;VENTOLIN HFA) 108 (90 Base) MCG/ACT inhaler Inhale 1-2 puffs into the lungs every 6 (six) hours as needed for wheezing or shortness of breath.    Rescue  . EPINEPHrine 0.3 mg/0.3 mL IJ SOAJ injection Inject 0.3 mLs (0.3 mg total) into the muscle once as needed. As needed for bee stings 2 Device 0 Emergency    Review of Systems  Gastrointestinal: Negative for abdominal pain, constipation, diarrhea, nausea and vomiting.  Genitourinary: Positive for vaginal bleeding and vaginal discharge. Negative for dysuria, hematuria and urgency.  Musculoskeletal: Positive for back pain.   Physical Exam   Blood pressure (!) 115/56, pulse 78, temperature 98.2 F (36.8 C), temperature source Oral, resp. rate 16, height 5\' 3"  (1.6 m), weight 97.1 kg, last menstrual period 12/16/2017, SpO2 100 %, unknown if currently  breastfeeding.  Physical Exam  Nursing note and vitals reviewed. Constitutional: She is oriented to person, place, and time. She appears well-developed and well-nourished. No distress.  HENT:  Head: Normocephalic and atraumatic.  Neck: Normal range of motion.  Cardiovascular: Normal rate.  Respiratory: Effort normal. No respiratory distress.  GI: Soft. She exhibits no distension. There is no tenderness.  gravid  Genitourinary:  Genitourinary Comments: External: no lesions or erythema Vagina: rugated, pink, moist, small tan/pink discharge Cervix: small blood on surface after q-tip contact; closed/thick   Musculoskeletal: Normal range of motion.       Cervical back: Normal. She exhibits no tenderness.       Thoracic back: Normal. She exhibits no tenderness.       Lumbar back: Normal. She exhibits no tenderness.  Neurological: She is alert and oriented to person, place, and time.  Skin: Skin is warm and dry.  Psychiatric: She has a normal mood and affect.  EFM: 135 bpm, mod variability, + accels, no decels Toco: irritability  Results for orders placed or performed during the hospital encounter of 08/02/18 (from the past 24 hour(s))  Urinalysis, Routine w reflex microscopic     Status: Abnormal   Collection Time: 08/02/18  8:47 AM  Result Value Ref Range   Color, Urine YELLOW YELLOW   APPearance HAZY (A) CLEAR   Specific Gravity, Urine 1.010 1.005 - 1.030   pH 7.0 5.0 - 8.0   Glucose, UA NEGATIVE NEGATIVE mg/dL   Hgb urine dipstick SMALL (A) NEGATIVE   Bilirubin Urine NEGATIVE NEGATIVE   Ketones, ur NEGATIVE NEGATIVE mg/dL   Protein, ur NEGATIVE NEGATIVE mg/dL   Nitrite NEGATIVE NEGATIVE   Leukocytes, UA SMALL (A) NEGATIVE   RBC / HPF 0-5 0 - 5 RBC/hpf   WBC, UA 0-5 0 - 5 WBC/hpf   Bacteria, UA RARE (A) NONE SEEN   Squamous Epithelial / LPF 0-5 0 - 5   Mucus PRESENT   Wet prep, genital     Status: Abnormal   Collection Time: 08/02/18  8:56 AM  Result Value Ref Range    Yeast Wet Prep HPF POC NONE SEEN NONE SEEN   Trich, Wet Prep NONE SEEN NONE SEEN   Clue Cells Wet Prep HPF POC NONE SEEN NONE SEEN   WBC, Wet Prep HPF POC MANY (A) NONE SEEN   Sperm NONE SEEN    MAU Course  Procedures Flexeril  MDM Labs ordered and reviewed. GC neg 10 days ago, not repeated. No evidence of UTI or PTL. No active bleeding.  Back pain improved. Stable for discharge.  Assessment and Plan  [redacted] weeks gestation NST reactive Spotting in pregnancy Rh pos Back pain in pregnancy Discharge home Follow up at [facility] next week as scheduled PTL precautions Flexeril prn (has Rx)  Allergies as of 08/02/2018      Reactions   Bee Venom Anaphylaxis   Adhesive [tape] Itching, Rash      Medication List    TAKE these medications   acetaminophen 325 MG tablet Commonly known as:  TYLENOL Take 650 mg by mouth as needed.   albuterol 108 (90 Base) MCG/ACT inhaler Commonly known as:  PROVENTIL HFA;VENTOLIN HFA Inhale 1-2 puffs into the lungs every 6 (six) hours as needed for wheezing or shortness of breath.   cyclobenzaprine 10 MG tablet Commonly known as:  FLEXERIL Take 1 tablet (10 mg total) by mouth 2 (two) times daily as needed for muscle spasms.   EPINEPHrine 0.3 mg/0.3 mL Soaj injection Commonly known as:  EPI-PEN Inject 0.3 mLs (0.3 mg total) into the muscle once as needed. As needed for bee stings   ferrous fumarate 325 (106 Fe) MG Tabs tablet Commonly known as:  HEMOCYTE - 106 mg FE Take 1 tablet (106 mg of iron total) by mouth 2 (two) times daily.   prenatal multivitamin Tabs tablet Take 1 tablet by mouth at bedtime.       Donette Larry, CNM 08/02/2018, 10:46 AM

## 2018-08-02 NOTE — Discharge Instructions (Signed)
Vaginal Bleeding During Pregnancy, Third Trimester A small amount of bleeding (spotting) from the vagina is common in pregnancy. Sometimes the bleeding is normal and is not a problem, and sometimes it is a sign of something serious. Be sure to tell your doctor about any bleeding from your vagina right away. Follow these instructions at home:  Watch your condition for any changes.  Follow your doctor's instructions about how active you can be.  If you are on bed rest: ? You may need to stay in bed and only get up to use the bathroom. ? You may be allowed to do some activities. ? If you need help, make plans for someone to help you.  Write down: ? The number of pads you use each day. ? How often you change pads. ? How soaked (saturated) your pads are.  Do not use tampons.  Do not douche.  Do not have sex or orgasms until your doctor says it is okay.  Follow your doctor's advice about lifting, driving, and doing physical activities.  If you pass any tissue from your vagina, save the tissue so you can show it to your doctor.  Only take medicines as told by your doctor.  Do not take aspirin because it can make you bleed.  Keep all follow-up visits as told by your doctor. Contact a doctor if:  You bleed from your vagina.  You have cramps.  You have labor pains.  You have a fever that does not go away after you take medicine. Get help right away if:  You have very bad cramps in your back or belly (abdomen).  You have chills.  You have a gush of fluid from your vagina.  You pass large clots or tissue from your vagina.  You bleed more.  You feel light-headed or weak.  You pass out (faint).  You do not feel your baby move around as much as before. This information is not intended to replace advice given to you by your health care provider. Make sure you discuss any questions you have with your health care provider. Document Released: 02/10/2014 Document Revised:  03/03/2016 Document Reviewed: 06/03/2013 Elsevier Interactive Patient Education  2018 [employer].  [employer] of the uterus can occur throughout pregnancy, but they are not always a sign that you are in labor. You may have practice contractions called Braxton Hicks contractions. These false labor contractions are sometimes confused with true labor. What are Deberah Pelton contractions? Braxton Hicks contractions are tightening movements that occur in the muscles of the uterus before labor. Unlike true labor contractions, these contractions do not result in opening (dilation) and thinning of the cervix. Toward the end of pregnancy (32-34 weeks), Braxton Hicks contractions can happen more often and may become stronger. These contractions are sometimes difficult to tell apart from true labor because they can be very uncomfortable. You should not feel embarrassed if you go to the hospital with false labor. Sometimes, the only way to tell if you are in true labor is for your health care provider to look for changes in the cervix. The health care provider will do a physical exam and may monitor your contractions. If you are not in true labor, the exam should show that your cervix is not dilating and your water has not broken. If there are other health problems associated with your pregnancy, it is completely safe for you to be sent home with false labor. You may continue to have [employer]  contractions until you go into true labor. How to tell the difference between true labor and false labor True labor  Contractions last 30-70 seconds.  Contractions become very regular.  Discomfort is usually felt in the top of the uterus, and it spreads to the lower abdomen and low back.  Contractions do not go away with walking.  Contractions usually become more intense and increase in frequency.  The cervix dilates and gets thinner. False labor  Contractions are usually  shorter and not as strong as true labor contractions.  Contractions are usually irregular.  Contractions are often felt in the front of the lower abdomen and in the groin.  Contractions may go away when you walk around or change positions while lying down.  Contractions get weaker and are shorter-lasting as time goes on.  The cervix usually does not dilate or become thin. Follow these instructions at home:  Take over-the-counter and prescription medicines only as told by your health care provider.  Keep up with your usual exercises and follow other instructions from your health care provider.  Eat and drink lightly if you think you are going into labor.  If Braxton Hicks contractions are making you uncomfortable: ? Change your position from lying down or resting to walking, or change from walking to resting. ? Sit and rest in a tub of warm water. ? Drink enough fluid to keep your urine pale yellow. Dehydration may cause these contractions. ? Do slow and deep breathing several times an hour.  Keep all follow-up prenatal visits as told by your health care provider. This is important. Contact a health care provider if:  You have a fever.  You have continuous pain in your abdomen. Get help right away if:  Your contractions become stronger, more regular, and closer together.  You have fluid leaking or gushing from your vagina.  You pass blood-tinged mucus (bloody show).  You have bleeding from your vagina.  You have low back pain that you never had before.  You feel your babys head pushing down and causing pelvic pressure.  Your baby is not moving inside you as much as it used to. Summary  Contractions that occur before labor are called Braxton Hicks contractions, false labor, or practice contractions.  Braxton Hicks contractions are usually shorter, weaker, farther apart, and less regular than true labor contractions. True labor contractions usually become progressively  stronger and regular and they become more frequent.  Manage discomfort from [facility] contractions by changing position, resting in a warm bath, drinking plenty of water, or practicing deep breathing. This information is not intended to replace advice given to you by your health care provider. Make sure you discuss any questions you have with your health care provider. Document Released: 02/09/2017 Document Revised: 02/09/2017 Document Reviewed: 02/09/2017 Elsevier Interactive Patient Education  2018 [employer].

## 2018-08-10 ENCOUNTER — Ambulatory Visit (INDEPENDENT_AMBULATORY_CARE_PROVIDER_SITE_OTHER): Payer: Medicaid Other | Admitting: Obstetrics and Gynecology

## 2018-08-10 ENCOUNTER — Encounter: Payer: Self-pay | Admitting: Obstetrics and Gynecology

## 2018-08-10 VITALS — BP 118/72 | HR 92 | Wt 214.0 lb

## 2018-08-10 DIAGNOSIS — Z3480 Encounter for supervision of other normal pregnancy, unspecified trimester: Secondary | ICD-10-CM

## 2018-08-10 DIAGNOSIS — D6800 Von Willebrand disease, unspecified: Secondary | ICD-10-CM

## 2018-08-10 NOTE — Patient Instructions (Signed)
Third Trimester of Pregnancy The third trimester is from week 28 through week 40 (months 7 through 9). The third trimester is a time when the unborn baby (fetus) is growing rapidly. At the end of the ninth month, the fetus is about 20 inches in length and weighs 6-10 pounds. Body changes during your third trimester Your body will continue to go through many changes during pregnancy. The changes vary from woman to woman. During the third trimester:  Your weight will continue to increase. You can expect to gain 25-35 pounds (11-16 kg) by the end of the pregnancy.  You may begin to get stretch marks on your hips, abdomen, and breasts.  You may urinate more often because the fetus is moving lower into your pelvis and pressing on your bladder.  You may develop or continue to have heartburn. This is caused by increased hormones that slow down muscles in the digestive tract.  You may develop or continue to have constipation because increased hormones slow digestion and cause the muscles that push waste through your intestines to relax.  You may develop hemorrhoids. These are swollen veins (varicose veins) in the rectum that can itch or be painful.  You may develop swollen, bulging veins (varicose veins) in your legs.  You may have increased body aches in the pelvis, back, or thighs. This is due to weight gain and increased hormones that are relaxing your joints.  You may have changes in your hair. These can include thickening of your hair, rapid growth, and changes in texture. Some women also have hair loss during or after pregnancy, or hair that feels dry or thin. Your hair will most likely return to normal after your baby is born.  Your breasts will continue to grow and they will continue to become tender. A yellow fluid (colostrum) may leak from your breasts. This is the first milk you are producing for your baby.  Your belly button may stick out.  You may notice more swelling in your hands,  face, or ankles.  You may have increased tingling or numbness in your hands, arms, and legs. The skin on your belly may also feel numb.  You may feel short of breath because of your expanding uterus.  You may have more problems sleeping. This can be caused by the size of your belly, increased need to urinate, and an increase in your body's metabolism.  You may notice the fetus "dropping," or moving lower in your abdomen (lightening).  You may have increased vaginal discharge.  You may notice your joints feel loose and you may have pain around your pelvic bone.  What to expect at prenatal visits You will have prenatal exams every 2 weeks until week 36. Then you will have weekly prenatal exams. During a routine prenatal visit:  You will be weighed to make sure you and the baby are growing normally.  Your blood pressure will be taken.  Your abdomen will be measured to track your baby's growth.  The fetal heartbeat will be listened to.  Any test results from the previous visit will be discussed.  You may have a cervical check near your due date to see if your cervix has softened or thinned (effaced).  You will be tested for Group B streptococcus. This happens between 35 and 37 weeks.  Your health care provider may ask you:  What your birth plan is.  How you are feeling.  If you are feeling the baby move.  If you have had   any abnormal symptoms, such as leaking fluid, bleeding, severe headaches, or abdominal cramping.  If you are using any tobacco products, including cigarettes, chewing tobacco, and electronic cigarettes.  If you have any questions.  Other tests or screenings that may be performed during your third trimester include:  Blood tests that check for low iron levels (anemia).  Fetal testing to check the health, activity level, and growth of the fetus. Testing is done if you have certain medical conditions or if there are problems during the  pregnancy.  Nonstress test (NST). This test checks the health of your baby to make sure there are no signs of problems, such as the baby not getting enough oxygen. During this test, a belt is placed around your belly. The baby is made to move, and its heart rate is monitored during movement.  What is false labor? False labor is a condition in which you feel small, irregular tightenings of the muscles in the womb (contractions) that usually go away with rest, changing position, or drinking water. These are called Braxton Hicks contractions. Contractions may last for hours, days, or even weeks before true labor sets in. If contractions come at regular intervals, become more frequent, increase in intensity, or become painful, you should see your health care provider. What are the signs of labor?  Abdominal cramps.  Regular contractions that start at 10 minutes apart and become stronger and more frequent with time.  Contractions that start on the top of the uterus and spread down to the lower abdomen and back.  Increased pelvic pressure and dull back pain.  A watery or bloody mucus discharge that comes from the vagina.  Leaking of amniotic fluid. This is also known as your "water breaking." It could be a slow trickle or a gush. Let your health care provider know if it has a color or strange odor. If you have any of these signs, call your health care provider right away, even if it is before your due date. Follow these instructions at home: Medicines  Follow your health care provider's instructions regarding medicine use. Specific medicines may be either safe or unsafe to take during pregnancy.  Take a prenatal vitamin that contains at least 600 micrograms (mcg) of folic acid.  If you develop constipation, try taking a stool softener if your health care provider approves. Eating and drinking  Eat a balanced diet that includes fresh fruits and vegetables, whole grains, good sources of protein  such as meat, eggs, or tofu, and low-fat dairy. Your health care provider will help you determine the amount of weight gain that is right for you.  Avoid raw meat and uncooked cheese. These carry germs that can cause birth defects in the baby.  If you have low calcium intake from food, talk to your health care provider about whether you should take a daily calcium supplement.  Eat four or five small meals rather than three large meals a day.  Limit foods that are high in fat and processed sugars, such as fried and sweet foods.  To prevent constipation: ? Drink enough fluid to keep your urine clear or pale yellow. ? Eat foods that are high in fiber, such as fresh fruits and vegetables, whole grains, and beans. Activity  Exercise only as directed by your health care provider. Most women can continue their usual exercise routine during pregnancy. Try to exercise for 30 minutes at least 5 days a week. Stop exercising if you experience uterine contractions.  Avoid heavy   lifting.  Do not exercise in extreme heat or humidity, or at high altitudes.  Wear low-heel, comfortable shoes.  Practice good posture.  You may continue to have sex unless your health care provider tells you otherwise. Relieving pain and discomfort  Take frequent breaks and rest with your legs elevated if you have leg cramps or low back pain.  Take warm sitz baths to soothe any pain or discomfort caused by hemorrhoids. Use hemorrhoid cream if your health care provider approves.  Wear a good support bra to prevent discomfort from breast tenderness.  If you develop varicose veins: ? Wear support pantyhose or compression stockings as told by your healthcare provider. ? Elevate your feet for 15 minutes, 3-4 times a day. Prenatal care  Write down your questions. Take them to your prenatal visits.  Keep all your prenatal visits as told by your health care provider. This is important. Safety  Wear your seat belt at  all times when driving.  Make a list of emergency phone numbers, including numbers for family, friends, the hospital, and police and fire departments. General instructions  Avoid cat litter boxes and soil used by cats. These carry germs that can cause birth defects in the baby. If you have a cat, ask someone to clean the litter box for you.  Do not travel far distances unless it is absolutely necessary and only with the approval of your health care provider.  Do not use hot tubs, steam rooms, or saunas.  Do not drink alcohol.  Do not use any products that contain nicotine or tobacco, such as cigarettes and e-cigarettes. If you need help quitting, ask your health care provider.  Do not use any medicinal herbs or unprescribed drugs. These chemicals affect the formation and growth of the baby.  Do not douche or use tampons or scented sanitary pads.  Do not cross your legs for long periods of time.  To prepare for the arrival of your baby: ? Take prenatal classes to understand, practice, and ask questions about labor and delivery. ? Make a trial run to the hospital. ? Visit the hospital and tour the maternity area. ? Arrange for maternity or paternity leave through employers. ? Arrange for family and friends to take care of pets while you are in the hospital. ? Purchase a rear-facing car seat and make sure you know how to install it in your car. ? Pack your hospital bag. ? Prepare the baby's nursery. Make sure to remove all pillows and stuffed animals from the baby's crib to prevent suffocation.  Visit your dentist if you have not gone during your pregnancy. Use a soft toothbrush to brush your teeth and be gentle when you floss. Contact a health care provider if:  You are unsure if you are in labor or if your water has broken.  You become dizzy.  You have mild pelvic cramps, pelvic pressure, or nagging pain in your abdominal area.  You have lower back pain.  You have persistent  nausea, vomiting, or diarrhea.  You have an unusual or bad smelling vaginal discharge.  You have pain when you urinate. Get help right away if:  Your water breaks before 37 weeks.  You have regular contractions less than 5 minutes apart before 37 weeks.  You have a fever.  You are leaking fluid from your vagina.  You have spotting or bleeding from your vagina.  You have severe abdominal pain or cramping.  You have rapid weight loss or weight gain.    You have shortness of breath with chest pain.  You notice sudden or extreme swelling of your face, hands, ankles, feet, or legs.  Your baby makes fewer than 10 movements in 2 hours.  You have severe headaches that do not go away when you take medicine.  You have vision changes. Summary  The third trimester is from week 28 through week 40, months 7 through 9. The third trimester is a time when the unborn baby (fetus) is growing rapidly.  During the third trimester, your discomfort may increase as you and your baby continue to gain weight. You may have abdominal, leg, and back pain, sleeping problems, and an increased need to urinate.  During the third trimester your breasts will keep growing and they will continue to become tender. A yellow fluid (colostrum) may leak from your breasts. This is the first milk you are producing for your baby.  False labor is a condition in which you feel small, irregular tightenings of the muscles in the womb (contractions) that eventually go away. These are called Braxton Hicks contractions. Contractions may last for hours, days, or even weeks before true labor sets in.  Signs of labor can include: abdominal cramps; regular contractions that start at 10 minutes apart and become stronger and more frequent with time; watery or bloody mucus discharge that comes from the vagina; increased pelvic pressure and dull back pain; and leaking of amniotic fluid. This information is not intended to replace advice  given to you by your health care provider. Make sure you discuss any questions you have with your health care provider. Document Released: 09/20/2001 Document Revised: 03/03/2016 Document Reviewed: 11/27/2012 Elsevier Interactive Patient Education  2017 [employer]  

## 2018-08-10 NOTE — Progress Notes (Signed)
Subjective:  [name] is a 27 y.o. G3P0020 at [redacted]w[redacted]d being seen today for ongoing prenatal care.  She is currently monitored for the following issues for this high-risk pregnancy and has Von Willebrand disease (HCC); History of multiple miscarriages; and Supervision of other normal pregnancy, antepartum on their problem list.  Patient reports no complaints.  Contractions: Irritability. Vag. Bleeding: None.  Movement: Present. Denies leaking of fluid.   The following portions of the patient's history were reviewed and updated as appropriate: allergies, current medications, past family history, past medical history, past social history, past surgical history and problem list. Problem list updated.  Objective:   Vitals:   08/10/18 1037  BP: 118/72  Pulse: 92  Weight: 214 lb (97.1 kg)    Fetal Status: Fetal Heart Rate (bpm): 153   Movement: Present     General:  Alert, oriented and cooperative. Patient is in no acute distress.  Skin: Skin is warm and dry. No rash noted.   Cardiovascular: Normal heart rate noted  Respiratory: Normal respiratory effort, no problems with respiration noted  Abdomen: Soft, gravid, appropriate for gestational age. Pain/Pressure: Present     Pelvic:  Cervical exam deferred        Extremities: Normal range of motion.  Edema: Trace  Mental Status: Normal mood and affect. Normal behavior. Normal judgment and thought content.   Urinalysis:      Assessment and Plan:  Pregnancy: G3P0020 at [redacted]w[redacted]d  1. Supervision of other normal pregnancy, antepartum Stable Growth scan 07/31/18 90 % Follow up scheduled GBS/vaginal cultures next visit  2. Von Willebrand disease (HCC) No evidence of Dx by Heme/Onc  Preterm labor symptoms and general obstetric precautions including but not limited to vaginal bleeding, contractions, leaking of fluid and fetal movement were reviewed in detail with the patient. Please refer to After Visit Summary for other counseling  recommendations.  Return in about 2 weeks (around 08/24/2018) for OB visit.   Hermina Staggers, MD

## 2018-08-13 ENCOUNTER — Ambulatory Visit (INDEPENDENT_AMBULATORY_CARE_PROVIDER_SITE_OTHER): Payer: Medicaid Other | Admitting: Family Medicine

## 2018-08-13 ENCOUNTER — Encounter: Payer: Self-pay | Admitting: *Deleted

## 2018-08-13 VITALS — BP 114/62 | HR 98 | Temp 98.6°F

## 2018-08-13 DIAGNOSIS — J069 Acute upper respiratory infection, unspecified: Secondary | ICD-10-CM

## 2018-08-13 DIAGNOSIS — B9789 Other viral agents as the cause of diseases classified elsewhere: Secondary | ICD-10-CM

## 2018-08-13 DIAGNOSIS — O9989 Other specified diseases and conditions complicating pregnancy, childbirth and the puerperium: Secondary | ICD-10-CM

## 2018-08-13 MED ORDER — FERROUS FUMARATE 325 (106 FE) MG PO TABS: 1.0000 | ORAL_TABLET | Freq: Two times a day (BID) | ORAL | 1 refills | Status: DC

## 2018-08-13 NOTE — Progress Notes (Signed)
   OFFICE VISIT NOTE History:   Chief Complaint  Patient presents with  . Sore Throat   27 y.o. G3P0020 at here today for acute visit. She started to have a sore throat over the weekend and also felt like her neck was sore. She was worried it might be strep throat. She has a non-productive cough. Sleeping okay. No sinus drainage or headache. No fever. Somewhat painful to swallow. Hasn't tried any medications yet but did try gargling with salt water and honey.   Review of Systems:  Pertinent items noted in HPI ROS  Objective:  Physical Exam BP 114/62   Pulse 98   Temp 98.6 F (37 C)   LMP 12/16/2017 (Exact Date)  FHTs 141 Physical Exam  Constitutional: She is oriented to person, place, and time. She appears well-developed and well-nourished. No distress.  HENT:  Head: Normocephalic and atraumatic.  Mouth/Throat: Posterior oropharyngeal erythema present. No oropharyngeal exudate.  Eyes: Conjunctivae and EOM are normal.  Neck: Neck supple. No thyromegaly present.  Cardiovascular: Normal rate, regular rhythm and normal heart sounds.  Pulmonary/Chest: Effort normal and breath sounds normal. No respiratory distress. She has no wheezes.  Lymphadenopathy:    She has cervical adenopathy.  Neurological: She is alert and oriented to person, place, and time.  Psychiatric: She has a normal mood and affect. Her behavior is normal.  Nursing note and vitals reviewed.   Assessment & Plan:   41 at [redacted]w[redacted]d who presents with signs and symptoms consistent with viral URI with cough. Low suspicion for strep throat given cough, no palatal petechia and no exudate. Lungs clear, afebrile, no sinus tenderness so low suspicion for pneumonia or bacterial sinusitis. Counseled on safe medications in pregnancy and recommended honey, cough drops, fluids, Tylenol, and trial of nasal spray with anti-histamine. Patient voiced understanding.   Cristal Deer. Earlene Plater, DO OB Family Medicine Fellow, [facility] for [employer], [facility]

## 2018-08-13 NOTE — Progress Notes (Signed)
Scratchy throat started Friday night. Yesterday was hard to swallow and neck sore. Has cough that is nonproductive. Denies fever/chills. Thinks having some drainage back of throat B/P 114/62 P-98 T 98.6 FHTs-141

## 2018-08-23 ENCOUNTER — Encounter: Payer: Self-pay | Admitting: Obstetrics and Gynecology

## 2018-08-23 ENCOUNTER — Encounter: Payer: Self-pay | Admitting: Family Medicine

## 2018-08-23 ENCOUNTER — Ambulatory Visit (INDEPENDENT_AMBULATORY_CARE_PROVIDER_SITE_OTHER): Payer: Medicaid Other | Admitting: Obstetrics and Gynecology

## 2018-08-23 VITALS — BP 115/67 | HR 103 | Wt 219.0 lb

## 2018-08-23 DIAGNOSIS — Z3483 Encounter for supervision of other normal pregnancy, third trimester: Secondary | ICD-10-CM

## 2018-08-23 DIAGNOSIS — Z3480 Encounter for supervision of other normal pregnancy, unspecified trimester: Secondary | ICD-10-CM

## 2018-08-23 NOTE — Progress Notes (Signed)
   PRENATAL VISIT NOTE  Subjective:  [name] is a 27 y.o. G3P0020 at 9131w5d being seen today for ongoing prenatal care.  She is currently monitored for the following issues for this low-risk pregnancy and has Von Willebrand disease (HCC); History of multiple miscarriages; and Supervision of other normal pregnancy, antepartum on their problem list.  Patient reports no complaints.  Contractions: Irritability. Vag. Bleeding: None.  Movement: Present. Denies leaking of fluid.   The following portions of the patient's history were reviewed and updated as appropriate: allergies, current medications, past family history, past medical history, past social history, past surgical history and problem list. Problem list updated.  Objective:   Vitals:   08/23/18 1601  BP: 115/67  Pulse: (!) 103  Weight: 219 lb (99.3 kg)    Fetal Status: Fetal Heart Rate (bpm): 144   Movement: Present     General:  Alert, oriented and cooperative. Patient is in no acute distress.  Skin: Skin is warm and dry. No rash noted.   Cardiovascular: Normal heart rate noted  Respiratory: Normal respiratory effort, no problems with respiration noted  Abdomen: Soft, gravid, appropriate for gestational age.  Pain/Pressure: Present     Pelvic: Cervical exam deferred        Extremities: Normal range of motion.  Edema: Trace  Mental Status: Normal mood and affect. Normal behavior. Normal judgment and thought content.   Assessment and Plan:  Pregnancy: G3P0020 at 5531w5d  1. Supervision of other normal pregnancy, antepartum Patient is doing well without complaints Cultures next visit Answered questions regarding pain management options Patient desires water birth and has taken the class. Will have her see midwife at a subsequent visit  Preterm labor symptoms and general obstetric precautions including but not limited to vaginal bleeding, contractions, leaking of fluid and fetal movement were reviewed in detail with the  patient. Please refer to After Visit Summary for other counseling recommendations.  Return in about 1 week (around 08/30/2018) for ROB.  Future Appointments  Date Time Provider Department Center  08/30/2018  9:15 AM Reva BoresPratt, [facility] S[facility]  09/04/2018  8:15 AM [facility] US 4 [facility]  09/05/2018  8:15 AM Adryana Mogensen, Gigi GinPeggy, MD [facility]  09/14/2018 10:55 AM Mckade Gurka, [facility]inPeggy[facility]  09/21/2018  8:15 AM Levie HeritageStinson, Jacob J, DO [facility]    Catalina AntiguaPeggy Kenyanna Grzesiak, MD

## 2018-08-26 ENCOUNTER — Encounter (HOSPITAL_COMMUNITY): Payer: Self-pay | Admitting: *Deleted

## 2018-08-26 ENCOUNTER — Inpatient Hospital Stay (HOSPITAL_COMMUNITY)
Admission: AD | Admit: 2018-08-26 | Discharge: 2018-08-26 | Disposition: A | Discharge status: Home / Self Care | Payer: Medicaid Other | Source: Ambulatory Visit | Attending: Obstetrics & Gynecology | Admitting: Obstetrics & Gynecology

## 2018-08-26 DIAGNOSIS — O99513 Diseases of the respiratory system complicating pregnancy, third trimester: Secondary | ICD-10-CM | POA: Insufficient documentation

## 2018-08-26 DIAGNOSIS — J45909 Unspecified asthma, uncomplicated: Secondary | ICD-10-CM | POA: Insufficient documentation

## 2018-08-26 DIAGNOSIS — O99113 Other diseases of the blood and blood-forming organs and certain disorders involving the immune mechanism complicating pregnancy, third trimester: Secondary | ICD-10-CM | POA: Insufficient documentation

## 2018-08-26 DIAGNOSIS — D680 Von Willebrand's disease: Secondary | ICD-10-CM | POA: Diagnosis not present

## 2018-08-26 DIAGNOSIS — O1203 Gestational edema, third trimester: Secondary | ICD-10-CM

## 2018-08-26 DIAGNOSIS — M7989 Other specified soft tissue disorders: Secondary | ICD-10-CM | POA: Diagnosis present

## 2018-08-26 DIAGNOSIS — Z888 Allergy status to other drugs, medicaments and biological substances status: Secondary | ICD-10-CM | POA: Insufficient documentation

## 2018-08-26 DIAGNOSIS — Z3A36 36 weeks gestation of pregnancy: Secondary | ICD-10-CM | POA: Diagnosis not present

## 2018-08-26 DIAGNOSIS — Z79899 Other long term (current) drug therapy: Secondary | ICD-10-CM | POA: Diagnosis not present

## 2018-08-26 DIAGNOSIS — O26893 Other specified pregnancy related conditions, third trimester: Secondary | ICD-10-CM | POA: Diagnosis not present

## 2018-08-26 DIAGNOSIS — F419 Anxiety disorder, unspecified: Secondary | ICD-10-CM | POA: Diagnosis not present

## 2018-08-26 DIAGNOSIS — O99343 Other mental disorders complicating pregnancy, third trimester: Secondary | ICD-10-CM | POA: Insufficient documentation

## 2018-08-26 LAB — URINALYSIS, ROUTINE W REFLEX MICROSCOPIC
Bilirubin Urine: NEGATIVE
GLUCOSE, UA: NEGATIVE mg/dL
HGB URINE DIPSTICK: NEGATIVE
Ketones, ur: NEGATIVE mg/dL
LEUKOCYTES UA: NEGATIVE
Nitrite: NEGATIVE
PH: 7 (ref 5.0–8.0)
Protein, ur: NEGATIVE mg/dL
SPECIFIC GRAVITY, URINE: 1.004 — AB (ref 1.005–1.030)

## 2018-08-26 NOTE — Discharge Instructions (Signed)
Skin Conditions During Pregnancy Pregnancy affects many parts of your body. One part is your skin. Most skin problems that develop during pregnancy are not serious and are considered a normal part of pregnancy. They go away on their own after the baby is born. Other skin problems may need treatment. What type of skin problems can develop during pregnancy?  Stretch marks. Stretch marks are purple or pink lines on the skin. They may appear on the belly, breasts, thighs, or buttocks. Stretch marks are caused by weight gain that causes the skin to stretch. Stretch marks do not cause problems. Almost all women get them during pregnancy.  Darkening of the skin (hyperpigmentation). The darkening may occur in patches or as a line. Patches may appear on the face, nipples, or genital area. Lines often stretch from the belly button to the pubic area. Hyperpigmentation develops in almost all pregnant women. It is more severe in women with a dark complexion.  Spider angiomas. These are tiny pink or red lines that go out from a center point, like the legs of a spider. Usually, they are on the face, neck, and arms. They do not cause problems. They are most common in women with light complexions.  Palmar erythema. This is a reddening of the palms. It is most common in women with light complexions.  Swelling and redness. This can occur on the face, eyelids, fingers, or toes.  Pruritic urticarial papules and plaques of pregnancy (PUPPP). This is a rash that is itchy, red, and has tiny blisters. The cause is unknown. It usually starts on the abdomen and may affect the arms or legs. It does not affect the face. It usually begins later in pregnancy. About a third of all pregnant women develop this condition. There are no associated problems to the fetus with this rash. Sometimes, oral steroids are used to calm down the itch. The rash clears after the baby is born.  Prurigo of pregnancy. This is a disease in which red  patches and bumps appear on the arms and legs. The cause is unknown. The patches and bumps clear after the baby is born. About a third of pregnant women develop this disease.  Acne. Pimples may develop, including in women who have had clear skin for a long time.  Skin tags. These are small flaps of skin that stick out from the body. They may grow or become darker during pregnancy. They are usually harmless.  Moles. These are flat or slightly raised growths. They are usually round and pink or brown. They may grow or become darker during pregnancy.  Intrahepatic cholestasis of pregnancy. This is a rare condition that causes itchy skin. It may run in families. It increases the risk of complications for the fetus. This condition usually resolves after delivery. It can recur with subsequent pregnancies.  Impetigo herpetiformis. This is a form of a severe skin disease called pustular psoriasis. Usually, delivery is the only method of resolving the condition.  Pruritic folliculitis of pregnancy. This is a rare condition that causes pimple-like skin growths. It develops in the middle or later stages of pregnancy. Its cause is unknown.It usually resolves 2-3 weeks after delivery.  Pemphigoid gestationis. This is a very rare autoimmune disease. It causes a severely itchy rash and blisters. The rash does not appear on the face, scalp, or inside of the mouth. It usually resolves 3 months after delivery. It may recur with subsequent pregnancies. Some pre-existing skin conditions, such as atopic dermatitis, may become worse   during pregnancy. Follow these instructions at home: Different conditions may have different instructions. In general:  Follow all your health care provider's directions about medicines to treat skin problems while you are pregnant. Do not use any over-the-counter medicines (including medicated creams and lotions) until you have checked with your health care provider. Many medicines are not  safe to use when you are pregnant.  Avoid time in the sun. This will help keep your skin from darkening. When you must be outside, use sunscreen and wear a hat with a wide brim to protect your face. The sunscreen should have a SPF of at least 15. This may help limit dark spots that develop when the skin is exposed to the sun.  To avoid problems from stretched skin: ? Do not sit or stand for long periods of time. ? Exercise regularly. This helps keep your skin in good condition.  Use a gentle soap. This helps prevent acne.  Do not get too hot or too sweaty. This makes some skin rashes worse.  Wear loose clothes made of a soft fabric. This prevents skin irritation.  For itching, add oatmeal or cornstarch to your bathwater.  Use a skin moisturizer. Ask your health care provider for suggestions.  This information is not intended to replace advice given to you by your health care provider. Make sure you discuss any questions you have with your health care provider. Document Released: 10/29/2010 Document Revised: 03/03/2016 Document Reviewed: 07/08/2013 Elsevier Interactive Patient Education  2018 [employer]vier Inc.  

## 2018-08-26 NOTE — MAU Note (Signed)
Victoria CulverRachael Nguyen is a 27 y.o. at 5762w1d here in MAU reporting: +swelling in hands and lower extremities. States woke up and noticed it.  Pain score: denies Vitals:   08/26/18 0731  BP: 105/66  Pulse: 90  Resp: 18  Temp: 97.7 F (36.5 C)  SpO2: 100%     FHT: 145 Lab orders placed from triage: ua

## 2018-08-26 NOTE — MAU Provider Note (Addendum)
History     CSN: [id number]  Arrival date and time: 08/26/18 0709   Chief Complaint  Patient presents with  . swelling of hands and feet   HPI   Ms.Montel CulverRachael Kitchings is a 27 y.o. female G3P0020 @ 34104w1d here in MAU with swelling in her feet and hands that she noticed a few days ago. Says she feels puffy. Says the swelling fluctuates from day to day. No other complaints, just wanted to make sure everything was ok. + fetal movement,  No bleeding.   OB History    Gravida  3   Para      Term      Preterm      AB  2   Living        SAB  2   TAB      Ectopic      Multiple      Live Births              Past Medical History:  Diagnosis Date  . Anemia   . Anxiety   . Asthma    exercise induced  . Complication of anesthesia   . Head injury, closed    scalp laceration, dizzy for a few minutes  . History of chlamydia   . PONV (postoperative nausea and vomiting)   . Von Willebrand disease (HCC)     Past Surgical History:  Procedure Laterality Date  . HAND SURGERY Left 2009   thumb , fracture  . HAND SURGERY Left 2006   Pins placed  . HARDWARE REMOVAL Left 02/10/2016   Procedure: LEFT SMALL FINGER DEEP IMPLANT REMOVAL;  Surgeon: Bradly BienenstockFred Ortmann, MD;  Location: [facility] OR;  Service: Orthopedics;  Laterality: Left;  . OPEN REDUCTION INTERNAL FIXATION (ORIF) PROXIMAL PHALANX Left 01/06/2016   Procedure: OPEN REDUCTION INTERNAL FIXATION (ORIF) LEFT SMALL FINGER;  Surgeon: Bradly BienenstockFred Ortmann, MD;  Location: [facility] OR;  Service: Orthopedics;  Laterality: Left;    Family History  Problem Relation Age of Onset  . Diabetes Father   . Hypertension Father   . Hypercholesterolemia Father   . Heart disease Father   . Cancer Paternal Grandfather   . Cancer Paternal Grandmother     Social History   Tobacco Use  . Smoking status: Never Smoker  . Smokeless tobacco: Never Used  Substance Use Topics  . Alcohol use: Not Currently    Comment: Currently no drinking due to pregnancy  . Drug  use: No    Allergies:  Allergies  Allergen Reactions  . Bee Venom Anaphylaxis  . Adhesive [Tape] Itching and Rash    Medications Prior to Admission  Medication Sig Dispense Refill Last Dose  . acetaminophen (TYLENOL) 325 MG tablet Take 650 mg by mouth as needed.   Taking  . albuterol (PROVENTIL HFA;VENTOLIN HFA) 108 (90 Base) MCG/ACT inhaler Inhale 1-2 puffs into the lungs every 6 (six) hours as needed for wheezing or shortness of breath.    Taking  . cyclobenzaprine (FLEXERIL) 10 MG tablet Take 1 tablet (10 mg total) by mouth 2 (two) times daily as needed for muscle spasms. 20 tablet 0 Taking  . EPINEPHrine 0.3 mg/0.3 mL IJ SOAJ injection Inject 0.3 mLs (0.3 mg total) into the muscle once as needed. As needed for bee stings 2 Device 0 Taking  . ferrous fumarate (HEMOCYTE - 106 MG FE) 325 (106 Fe) MG TABS tablet Take 1 tablet (106 mg of iron total) by mouth 2 (two) times daily. 60 each 1   .  Prenatal Vit-Fe Fumarate-FA (PRENATAL MULTIVITAMIN) TABS tablet Take 1 tablet by mouth at bedtime.   Taking   No results found for this or any previous visit (from the past 48 hour(s)).  Review of Systems  Gastrointestinal: Negative for abdominal pain.  Genitourinary: Negative for vaginal bleeding and vaginal discharge.  Musculoskeletal: Positive for joint swelling.   Physical Exam   Blood pressure 105/66, pulse 90, temperature 97.7 F (36.5 C), temperature source Oral, resp. rate 18, weight 99.4 kg, last menstrual period 12/16/2017, SpO2 100 %, unknown if currently breastfeeding.  Physical Exam  Constitutional: She is oriented to person, place, and time. She appears well-developed and well-nourished. No distress.  HENT:  Head: Normocephalic.  GI: Soft. She exhibits no distension. There is no tenderness. There is no rebound.  Musculoskeletal: Normal range of motion. She exhibits no edema.  Neurological: She is alert and oriented to person, place, and time.  Skin: Skin is warm. She is not  diaphoretic.  Psychiatric: Her behavior is normal.   MAU Course  Procedures  None  MDM  Report given to V. Katrinka Blazing CNM who assumes care of the patient   Rasch, Victorino Dike I, NP  FHR now 130, mod variability, 15x15 accels, no decels Pt w/out edema now, but has pictures showing what appear to be 3+ edema. BP is Nml . Pt denies HA, vision change, epigastric pain so there is no concern for Preeclampsia. Neg homans and no risk factors for DTV except pregnancy. Edema C/W nml chnages or pregnancy. Recommend limiting sodium intake in increasing water. Elevate legs. Pre-E precautions and VT precautions reviewed.  Assessment and Plan   1. Swelling of lower extremity during pregnancy in third trimester    Discharge home in stable condition Preeclampsia and DVT precautions reviewed. Recommend limiting sodium intake in increasing water. Elevate legs.  Follow-up Information    Center for [facility] up.   Specialty:  Obstetrics and Gynecology Why: 08/28/2018 as scheduled or sooner as needed  Contact information: [address] [place] [address] [phone]       [facility] Follow up.   Why:  as needed in pregnancy emergencies Contact information: [address] [id number] mc [place] [address] [phone]         Allergies as of 08/26/2018      Reactions   Bee Venom Anaphylaxis   Adhesive [tape] Itching, Rash      Medication List    TAKE these medications   acetaminophen 325 MG tablet Commonly known as:  TYLENOL Take 650 mg by mouth as needed.   albuterol 108 (90 Base) MCG/ACT inhaler Commonly known as:  PROVENTIL HFA;VENTOLIN HFA Inhale 1-2 puffs into the lungs every 6 (six) hours as needed for wheezing or shortness of breath.   cyclobenzaprine 10 MG tablet Commonly known as:  FLEXERIL Take 1 tablet (10 mg total) by mouth 2 (two) times daily as needed for muscle spasms.   EPINEPHrine  0.3 mg/0.3 mL Soaj injection Commonly known as:  EPI-PEN Inject 0.3 mLs (0.3 mg total) into the muscle once as needed. As needed for bee stings   ferrous fumarate 325 (106 Fe) MG Tabs tablet Commonly known as:  HEMOCYTE - 106 mg FE Take 1 tablet (106 mg of iron total) by mouth 2 (two) times daily.   prenatal multivitamin Tabs tablet Take 1 tablet by mouth at bedtime.

## 2018-08-28 ENCOUNTER — Other Ambulatory Visit (HOSPITAL_COMMUNITY)
Admission: RE | Admit: 2018-08-28 | Discharge: 2018-08-28 | Disposition: A | Discharge status: Home / Self Care | Payer: Medicaid Other | Source: Ambulatory Visit | Attending: Family Medicine | Admitting: Family Medicine

## 2018-08-28 ENCOUNTER — Ambulatory Visit (INDEPENDENT_AMBULATORY_CARE_PROVIDER_SITE_OTHER): Payer: Medicaid Other | Admitting: Advanced Practice Midwife

## 2018-08-28 VITALS — BP 118/64 | HR 97 | Wt 219.1 lb

## 2018-08-28 DIAGNOSIS — Z3480 Encounter for supervision of other normal pregnancy, unspecified trimester: Secondary | ICD-10-CM | POA: Diagnosis present

## 2018-08-28 DIAGNOSIS — B3731 Acute candidiasis of vulva and vagina: Secondary | ICD-10-CM

## 2018-08-28 DIAGNOSIS — B373 Candidiasis of vulva and vagina: Secondary | ICD-10-CM

## 2018-08-28 DIAGNOSIS — Z3483 Encounter for supervision of other normal pregnancy, third trimester: Secondary | ICD-10-CM

## 2018-08-28 DIAGNOSIS — Z049 Encounter for examination and observation for unspecified reason: Secondary | ICD-10-CM

## 2018-08-28 DIAGNOSIS — Z3A36 36 weeks gestation of pregnancy: Secondary | ICD-10-CM

## 2018-08-28 MED ORDER — TERCONAZOLE 0.4 % VA CREA: 1.0000 | TOPICAL_CREAM | Freq: Every day | VAGINAL | 0 refills | Status: DC

## 2018-08-28 NOTE — Progress Notes (Signed)
   PRENATAL VISIT NOTE  Subjective:  [name] is a 27 y.o. G3P0020 at 1088w3d being seen today for ongoing prenatal care.  She is currently monitored for the following issues for this low-risk pregnancy and has Von Willebrand disease (HCC); History of multiple miscarriages; Supervision of other normal pregnancy, antepartum; and Suspected condition not found on their problem list.  Patient reports vaginal discharge.  Contractions: Irritability. Vag. Bleeding: None.  Movement: Present. Denies leaking of fluid.   The following portions of the patient's history were reviewed and updated as appropriate: allergies, current medications, past family history, past medical history, past social history, past surgical history and problem list. Problem list updated.  Objective:   Vitals:   08/28/18 1357  BP: 118/64  Pulse: 97  Weight: 219 lb 1.6 oz (99.4 kg)    Fetal Status: Fetal Heart Rate (bpm): 139 Fundal Height: 38 cm Movement: Present  Presentation: Vertex  General:  Alert, oriented and cooperative. Patient is in no acute distress.  Skin: Skin is warm and dry. No rash noted.   Cardiovascular: Normal heart rate noted  Respiratory: Normal respiratory effort, no problems with respiration noted  Abdomen: Soft, gravid, appropriate for gestational age.  Pain/Pressure: Absent     Pelvic: Cervical exam performed Dilation: 1 Effacement (%): 0 Station: -3  Extremities: Normal range of motion.  Edema: Mild pitting, slight indentation  Mental Status: Normal mood and affect. Normal behavior. Normal judgment and thought content.   Assessment and Plan:  Pregnancy: G3P0020 at 6788w3d  1. Suspected condition not found - neg Von Willebrand's testing x 3. Reviewed with Dr. Macon LargeAnyanwu. Will remove Dx from chart.   2. Supervision Nml pregnancy - Waterbirth consent signed.    Term labor symptoms and general obstetric precautions including but not limited to vaginal bleeding, contractions, leaking of fluid and  fetal movement were reviewed in detail with the patient. Please refer to After Visit Summary for other counseling recommendations.  No follow-ups on file.  Future Appointments  Date Time Provider Department Center  09/04/2018  8:15 AM [facility] US 4 [facility]  09/05/2018  8:15 AM Constant, Gigi GinPeggy, MD [facility]  09/14/2018 10:55 AM Constant, [facility]Peggy, [facility]  09/21/2018  8:15 AM Levie HeritageStinson, Jacob J, DO [facility]    [name], CNM

## 2018-08-28 NOTE — Patient Instructions (Signed)
Considering Waterbirth? Guide for patients at [facility]  Why consider waterbirth?  . Gentle birth for babies . Less pain medicine used in labor . May allow for passive descent/less pushing . May reduce perineal tears  . More mobility and instinctive maternal position changes . Increased maternal relaxation . Reduced blood pressure in labor  Is waterbirth safe? What are the risks of infection, drowning or other complications?  . Infection: o Very low risk (3.7 % for tub vs 4.8% for bed) o 7 in 8000 waterbirths with documented infection o Poorly cleaned equipment most common cause o Slightly lower group B strep transmission rate  . Drowning o Maternal:  - Very low risk   - Related to seizures or fainting o Newborn:  - Very low risk. No evidence of increased risk of respiratory problems in multiple large studies - Physiological protection from breathing under water - Avoid underwater birth if there are any fetal complications - Once baby's head is out of the water, keep it out.  . Birth complication o Some reports of cord trauma, but risk decreased by bringing baby to surface gradually o No evidence of increased risk of shoulder dystocia. Mothers can usually change positions faster in water than in a bed, possibly aiding the maneuvers to free the shoulder.   You must attend a Waterbirth class at [facility]  3rd Wednesday of every month from 7-9pm  Free  Register by calling [phone] or online at [email or web address]  Bring us the certificate from the class to your prenatal appointment  Meet with a midwife at 36 weeks to see if you can still plan a waterbirth and to sign the consent.   Purchase or rent the following supplies: You are responsible for providing all supplies listed above. **If you do not have all necessary supplies you cannot have a waterbirth.**   Water Birth Pool (Birth Pool in a Box or [employer] for instance)  (Tubs start  ~$125)  Single-use disposable tub liner designed for your brand of tub  Electric drain pump to remove water (We recommend 792 gallon per hour or greater pump.)   New garden hose labeled "lead-free", "suitable for drinking water",  Separate garden hose to remove the dirty water  Fish net  Bathing suit top (optional)  Long-handled mirror (optional)  Places to purchase or rent supplies:   Yourwaterbirth.com for tub purchases and supplies  Waterbirthsolutions.com for tub purchases and supplies  [employer] (www.thelaborladies.com) $275 for tub rental/set-up & take down/kit   [place] [employer] (http://www.[employer]anc.org/MeetUs.htm) Information regarding doulas (labor support) who provide pool rentals  Things that would prevent you from having a waterbirth:  Premature, <37wks  Previous cesarean birth  Presence of thick meconium-stained fluid  Multiple gestation (Twins, triplets, etc.)  Uncontrolled diabetes or gestational diabetes requiring medication  Hypertension requiring medication or diagnosis of pre-eclampsia  Heavy vaginal bleeding  Non-reassuring fetal heart rate  Active infection (MRSA, etc.). Group B Strep is NOT a contraindication for waterbirth.  If your labor has to be induced and induction method requires continuous monitoring of the baby's heart rate  Other risks/issues identified by your obstetrical provider  Please remember that birth is unpredictable. Under certain unforeseeable circumstances your provider may advise against giving birth in the tub. These decisions will be made on a case-by-case basis and with the safety of you and your baby as our highest priority.    

## 2018-08-29 LAB — CERVICOVAGINAL ANCILLARY ONLY
BACTERIAL VAGINITIS: NEGATIVE
CANDIDA VAGINITIS: NEGATIVE
CHLAMYDIA, DNA PROBE: NEGATIVE
Neisseria Gonorrhea: NEGATIVE
TRICH (WINDOWPATH): NEGATIVE

## 2018-08-30 ENCOUNTER — Encounter: Payer: Medicaid Other | Admitting: Family Medicine

## 2018-08-31 LAB — CULTURE, BETA STREP (GROUP B ONLY): STREP GP B CULTURE: NEGATIVE

## 2018-09-03 ENCOUNTER — Ambulatory Visit (INDEPENDENT_AMBULATORY_CARE_PROVIDER_SITE_OTHER): Payer: Medicaid Other | Admitting: Student

## 2018-09-03 ENCOUNTER — Encounter: Payer: Self-pay | Admitting: Family Medicine

## 2018-09-03 VITALS — BP 110/62 | HR 93 | Wt 221.4 lb

## 2018-09-03 DIAGNOSIS — Z3A37 37 weeks gestation of pregnancy: Secondary | ICD-10-CM

## 2018-09-03 DIAGNOSIS — R42 Dizziness and giddiness: Secondary | ICD-10-CM

## 2018-09-03 DIAGNOSIS — O9989 Other specified diseases and conditions complicating pregnancy, childbirth and the puerperium: Secondary | ICD-10-CM

## 2018-09-03 DIAGNOSIS — Z3480 Encounter for supervision of other normal pregnancy, unspecified trimester: Secondary | ICD-10-CM

## 2018-09-03 LAB — POCT URINALYSIS DIP (DEVICE)
Bilirubin Urine: NEGATIVE
Glucose, UA: NEGATIVE mg/dL
HGB URINE DIPSTICK: NEGATIVE
Ketones, ur: NEGATIVE mg/dL
NITRITE: NEGATIVE
PH: 7 (ref 5.0–8.0)
Protein, ur: NEGATIVE mg/dL
Specific Gravity, Urine: 1.015 (ref 1.005–1.030)
Urobilinogen, UA: 0.2 mg/dL (ref 0.0–1.0)

## 2018-09-03 NOTE — Patient Instructions (Addendum)
Eating Plan for Pregnant Women While you are pregnant, your body will require additional nutrition to help support your growing baby. It is recommended that you consume:  150 additional calories each day during your first trimester.  300 additional calories each day during your second trimester.  300 additional calories each day during your third trimester.  Eating a healthy, well-balanced diet is very important for your health and for your baby's health. You also have a higher need for some vitamins and minerals, such as folic acid, calcium, iron, and vitamin D. What do I need to know about eating during pregnancy?  Do not try to lose weight or go on a diet during pregnancy.  Choose healthy, nutritious foods. Choose  of a sandwich with a glass of milk instead of a candy bar or a high-calorie sugar-sweetened beverage.  Limit your overall intake of foods that have "empty calories." These are foods that have little nutritional value, such as sweets, desserts, candies, sugar-sweetened beverages, and fried foods.  Eat a variety of foods, especially fruits and vegetables.  Take a prenatal vitamin to help meet the additional needs during pregnancy, specifically for folic acid, iron, calcium, and vitamin D.  Remember to stay active. Ask your health care provider for exercise recommendations that are specific to you.  Practice good food safety and cleanliness, such as washing your hands before you eat and after you prepare raw meat. This helps to prevent foodborne illnesses, such as listeriosis, that can be very dangerous for your baby. Ask your health care provider for more information about listeriosis. What does 150 extra calories look like? Healthy options for an additional 150 calories each day could be any of the following:  Plain low-fat yogurt (6-8 oz) with  cup of berries.  1 apple with 2 teaspoons of peanut butter.  Cut-up vegetables with  cup of hummus.  Low-fat chocolate milk  (8 oz or 1 cup).  1 string cheese with 1 medium orange.   of a peanut butter and jelly sandwich on whole-wheat bread (1 tsp of peanut butter).  For 300 calories, you could eat two of those healthy options each day. What is a healthy amount of weight to gain? The recommended amount of weight for you to gain is based on your pre-pregnancy BMI. If your pre-pregnancy BMI was:  Less than 18 (underweight), you should gain 28-40 lb.  18-24.9 (normal), you should gain 25-35 lb.  25-29.9 (overweight), you should gain 15-25 lb.  Greater than 30 (obese), you should gain 11-20 lb.  What if I am having twins or multiples? Generally, pregnant women who will be having twins or multiples may need to increase their daily calories by 300-600 calories each day. The recommended range for total weight gain is 25-54 lb, depending on your pre-pregnancy BMI. Talk with your health care provider for specific guidance about additional nutritional needs, weight gain, and exercise during your pregnancy. What foods can I eat? Grains Any grains. Try to choose whole grains, such as whole-wheat bread, oatmeal, or brown rice. Vegetables Any vegetables. Try to eat a variety of colors and types of vegetables to get a full range of vitamins and minerals. Remember to wash your vegetables well before eating. Fruits Any fruits. Try to eat a variety of colors and types of fruit to get a full range of vitamins and minerals. Remember to wash your fruits well before eating. Meats and Other Protein Sources Lean meats, including chicken, [place], fish, and lean cuts of beef, veal,  or pork. Make sure that all meats are cooked to "well done." Tofu. Tempeh. Beans. Eggs. Peanut butter and other nut butters. Seafood, such as shrimp, crab, and lobster. If you choose fish, select types that are higher in omega-3 fatty acids, including salmon, herring, mussels, trout, sardines, and pollock. Make sure that all meats are cooked to food-safe  temperatures. Dairy Pasteurized milk and milk alternatives. Pasteurized yogurt and pasteurized cheese. Cottage cheese. Sour cream. Beverages Water. Juices that contain 100% fruit juice or vegetable juice. Caffeine-free teas and decaffeinated coffee. Drinks that contain caffeine are okay to drink, but it is better to avoid caffeine. Keep your total caffeine intake to less than 200 mg each day (12 oz of coffee, tea, or soda) or as directed by your health care provider. Condiments Any pasteurized condiments. Sweets and Desserts Any sweets and desserts. Fats and Oils Any fats and oils. The items listed above may not be a complete list of recommended foods or beverages. Contact your dietitian for more options. What foods are not recommended? Vegetables Unpasteurized (raw) vegetable juices. Fruits Unpasteurized (raw) fruit juices. Meats and Other Protein Sources Cured meats that have nitrates, such as bacon, salami, and hotdogs. Luncheon meats, bologna, or other deli meats (unless they are reheated until they are steaming hot). Refrigerated pate, meat spreads from a meat counter, smoked seafood that is found in the refrigerated section of a store. Raw fish, such as sushi or sashimi. High mercury content fish, such as tilefish, shark, swordfish, and king mackerel. Raw meats, such as tuna or beef tartare. Undercooked meats and poultry. Make sure that all meats are cooked to food-safe temperatures. Dairy Unpasteurized (raw) milk and any foods that have raw milk in them. Soft cheeses, such as feta, queso blanco, queso fresco, Brie, Camembert cheeses, blue-veined cheeses, and Panela cheese (unless it is made with pasteurized milk, which must be stated on the label). Beverages Alcohol. Sugar-sweetened beverages, such as sodas, teas, or energy drinks. Condiments Homemade fermented foods and drinks, such as pickles, sauerkraut, or kombucha drinks. (Store-bought pasteurized versions of these are  okay.) Other Salads that are made in the store, such as ham salad, chicken salad, egg salad, tuna salad, and seafood salad. The items listed above may not be a complete list of foods and beverages to avoid. Contact your dietitian for more information. This information is not intended to replace advice given to you by your health care provider. Make sure you discuss any questions you have with your health care provider. Document Released: 07/11/2014 Document Revised: 03/03/2016 Document Reviewed: 03/11/2014 Elsevier Interactive Patient Education  2018 [employer].  Eating Plan for Pregnant Women While you are pregnant, your body will require additional nutrition to help support your growing baby. It is recommended that you consume:  150 additional calories each day during your first trimester.  300 additional calories each day during your second trimester.  300 additional calories each day during your third trimester.  Eating a healthy, well-balanced diet is very important for your health and for your baby's health. You also have a higher need for some vitamins and minerals, such as folic acid, calcium, iron, and vitamin D. What do I need to know about eating during pregnancy?  Do not try to lose weight or go on a diet during pregnancy.  Choose healthy, nutritious foods. Choose  of a sandwich with a glass of milk instead of a candy bar or a high-calorie sugar-sweetened beverage.  Limit your overall intake of foods that have "empty  calories." These are foods that have little nutritional value, such as sweets, desserts, candies, sugar-sweetened beverages, and fried foods.  Eat a variety of foods, especially fruits and vegetables.  Take a prenatal vitamin to help meet the additional needs during pregnancy, specifically for folic acid, iron, calcium, and vitamin D.  Remember to stay active. Ask your health care provider for exercise recommendations that are specific to you.  Practice  good food safety and cleanliness, such as washing your hands before you eat and after you prepare raw meat. This helps to prevent foodborne illnesses, such as listeriosis, that can be very dangerous for your baby. Ask your health care provider for more information about listeriosis. What does 150 extra calories look like? Healthy options for an additional 150 calories each day could be any of the following:  Plain low-fat yogurt (6-8 oz) with  cup of berries.  1 apple with 2 teaspoons of peanut butter.  Cut-up vegetables with  cup of hummus.  Low-fat chocolate milk (8 oz or 1 cup).  1 string cheese with 1 medium orange.   of a peanut butter and jelly sandwich on whole-wheat bread (1 tsp of peanut butter).  For 300 calories, you could eat two of those healthy options each day. What is a healthy amount of weight to gain? The recommended amount of weight for you to gain is based on your pre-pregnancy BMI. If your pre-pregnancy BMI was:  Less than 18 (underweight), you should gain 28-40 lb.  18-24.9 (normal), you should gain 25-35 lb.  25-29.9 (overweight), you should gain 15-25 lb.  Greater than 30 (obese), you should gain 11-20 lb.  What if I am having twins or multiples? Generally, pregnant women who will be having twins or multiples may need to increase their daily calories by 300-600 calories each day. The recommended range for total weight gain is 25-54 lb, depending on your pre-pregnancy BMI. Talk with your health care provider for specific guidance about additional nutritional needs, weight gain, and exercise during your pregnancy. What foods can I eat? Grains Any grains. Try to choose whole grains, such as whole-wheat bread, oatmeal, or brown rice. Vegetables Any vegetables. Try to eat a variety of colors and types of vegetables to get a full range of vitamins and minerals. Remember to wash your vegetables well before eating. Fruits Any fruits. Try to eat a variety of  colors and types of fruit to get a full range of vitamins and minerals. Remember to wash your fruits well before eating. Meats and Other Protein Sources Lean meats, including chicken, [place], fish, and lean cuts of beef, veal, or pork. Make sure that all meats are cooked to "well done." Tofu. Tempeh. Beans. Eggs. Peanut butter and other nut butters. Seafood, such as shrimp, crab, and lobster. If you choose fish, select types that are higher in omega-3 fatty acids, including salmon, herring, mussels, trout, sardines, and pollock. Make sure that all meats are cooked to food-safe temperatures. Dairy Pasteurized milk and milk alternatives. Pasteurized yogurt and pasteurized cheese. Cottage cheese. Sour cream. Beverages Water. Juices that contain 100% fruit juice or vegetable juice. Caffeine-free teas and decaffeinated coffee. Drinks that contain caffeine are okay to drink, but it is better to avoid caffeine. Keep your total caffeine intake to less than 200 mg each day (12 oz of coffee, tea, or soda) or as directed by your health care provider. Condiments Any pasteurized condiments. Sweets and Desserts Any sweets and desserts. Fats and Oils Any fats and oils. The  items listed above may not be a complete list of recommended foods or beverages. Contact your dietitian for more options. What foods are not recommended? Vegetables Unpasteurized (raw) vegetable juices. Fruits Unpasteurized (raw) fruit juices. Meats and Other Protein Sources Cured meats that have nitrates, such as bacon, salami, and hotdogs. Luncheon meats, bologna, or other deli meats (unless they are reheated until they are steaming hot). Refrigerated pate, meat spreads from a meat counter, smoked seafood that is found in the refrigerated section of a store. Raw fish, such as sushi or sashimi. High mercury content fish, such as tilefish, shark, swordfish, and king mackerel. Raw meats, such as tuna or beef tartare. Undercooked meats and  poultry. Make sure that all meats are cooked to food-safe temperatures. Dairy Unpasteurized (raw) milk and any foods that have raw milk in them. Soft cheeses, such as feta, queso blanco, queso fresco, Brie, Camembert cheeses, blue-veined cheeses, and Panela cheese (unless it is made with pasteurized milk, which must be stated on the label). Beverages Alcohol. Sugar-sweetened beverages, such as sodas, teas, or energy drinks. Condiments Homemade fermented foods and drinks, such as pickles, sauerkraut, or kombucha drinks. (Store-bought pasteurized versions of these are okay.) Other Salads that are made in the store, such as ham salad, chicken salad, egg salad, tuna salad, and seafood salad. The items listed above may not be a complete list of foods and beverages to avoid. Contact your dietitian for more information. This information is not intended to replace advice given to you by your health care provider. Make sure you discuss any questions you have with your health care provider. Document Released: 07/11/2014 Document Revised: 03/03/2016 Document Reviewed: 03/11/2014 Elsevier Interactive Patient Education  [employer].

## 2018-09-03 NOTE — Progress Notes (Signed)
Pt states starting today she has been feeling light headed & dizzy

## 2018-09-03 NOTE — Progress Notes (Signed)
[name], female   DOB: [birth year], 27 y.o.   MRN: [id number]   PRENATAL VISIT NOTE  Subjective:  [name] is a 27 y.o. G3P0020 at 90w2dbeing seen today for ongoing prenatal care.  She is currently monitored for the following issues for this low-risk pregnancy and has [place] W[place]brand disease ([place]; History of multiple miscarriages; Supervision of other normal pregnancy, antepartum; Suspected condition not found; and Dizziness on their problem list.  Patient reports feeling light-headed and dizzy today, even after eating. States that she did not feel well at work; she felt very dizzy.  Has not been exerting herself, did laundry and homework all weekend. .  She had t[place]at[place]af, collard greens, bagel, mac and cheese, banana, yogurt.  Contractions: Irritability. Vag. Bleeding: None, Bloody Show.  Movement: Present. Denies leaking of fluid.   The following portions of the patient's history were reviewed and updated as appropriate: allergies, current medications, past family history, past medical history, past social history, past surgical history and problem list. Problem list updated.  Objective:   Vitals:   09/03/18 1757  BP: 110/62  Pulse: 93  Weight: 221 lb 6.4 oz (100.4 kg)    Fetal Status: Fetal Heart Rate (bpm): 150   Movement: Present     General:  Alert, oriented and cooperative. Patient is in no acute distress.  Skin: Skin is warm and dry. No rash noted.   Cardiovascular: Normal heart rate noted  Respiratory: Normal respiratory effort, no problems with respiration noted  Abdomen: Soft, gravid, appropriate for gestational age.  Pain/Pressure: Absent     Pelvic: Cervical exam deferred        Extremities: Normal range of motion.  Edema: Trace  Mental Status: Normal mood and affect. Normal behavior. Normal judgment and thought content.   Assessment and Plan:  Pregnancy: [id number] 26w2d1. Dizziness - Urinalysis: negative - CBC  2. Supervision of other  normal pregnancy, antepartum Doing well otherwise; recommend eating every 2-3 hours, decrease carbs, increase protein, water, gatorad  Preterm labor symptoms and general obstetric precautions including but not limited to vaginal bleeding, contractions, leaking of fluid and fetal movement were reviewed in detail with the patient. Please refer to After Visit Summary for other counseling recommendations.  No follow-ups on file.  Future Appointments  Date Time Provider DeLockwood11/26/2019  8:15 AM WHT[place] [facility]-MFCUS MFC-US  09/05/2018  8:15 AM Constant, PeVickii ChafeMD WOC-WOCA WOC  09/14/2018 10:55 AM Constant, PeVickii ChafeMD [facility]  09/21/2018  8:15 AM StTruett MainlandDO WOC-WOCA WOC    [place]

## 2018-09-04 ENCOUNTER — Ambulatory Visit (HOSPITAL_COMMUNITY)
Admission: RE | Admit: 2018-09-04 | Discharge: 2018-09-04 | Disposition: A | Discharge status: Home / Self Care | Payer: Medicaid Other | Source: Ambulatory Visit | Attending: Student | Admitting: Student

## 2018-09-04 DIAGNOSIS — O2693 Pregnancy related conditions, unspecified, third trimester: Secondary | ICD-10-CM | POA: Diagnosis not present

## 2018-09-04 DIAGNOSIS — Z362 Encounter for other antenatal screening follow-up: Secondary | ICD-10-CM | POA: Diagnosis present

## 2018-09-04 DIAGNOSIS — O3663X0 Maternal care for excessive fetal growth, third trimester, not applicable or unspecified: Secondary | ICD-10-CM

## 2018-09-04 DIAGNOSIS — O4403 Placenta previa specified as without hemorrhage, third trimester: Secondary | ICD-10-CM | POA: Diagnosis not present

## 2018-09-04 DIAGNOSIS — Z3A37 37 weeks gestation of pregnancy: Secondary | ICD-10-CM | POA: Insufficient documentation

## 2018-09-04 DIAGNOSIS — O99213 Obesity complicating pregnancy, third trimester: Secondary | ICD-10-CM

## 2018-09-04 LAB — CBC
Hematocrit: 35 % (ref 34.0–46.6)
Hemoglobin: 12.2 g/dL (ref 11.1–15.9)
MCH: 30.7 pg (ref 26.6–33.0)
MCHC: 34.9 g/dL (ref 31.5–35.7)
MCV: 88 fL (ref 79–97)
Platelets: 359 10*3/uL (ref 150–450)
RBC: 3.98 x10E6/uL (ref 3.77–5.28)
RDW: 12.5 % (ref 12.3–15.4)
WBC: 14.4 10*3/uL — ABNORMAL HIGH (ref 3.4–10.8)

## 2018-09-04 LAB — URINALYSIS
BILIRUBIN UA: NEGATIVE
GLUCOSE, UA: NEGATIVE
KETONES UA: NEGATIVE
NITRITE UA: NEGATIVE
Protein, UA: NEGATIVE
RBC UA: NEGATIVE
SPEC GRAV UA: 1.011 (ref 1.005–1.030)
UUROB: 0.2 mg/dL (ref 0.2–1.0)
pH, UA: 6.5 (ref 5.0–7.5)

## 2018-09-05 ENCOUNTER — Encounter: Payer: Medicaid Other | Admitting: Obstetrics and Gynecology

## 2018-09-13 ENCOUNTER — Other Ambulatory Visit: Payer: Self-pay

## 2018-09-13 ENCOUNTER — Encounter (HOSPITAL_COMMUNITY): Payer: Self-pay

## 2018-09-13 ENCOUNTER — Inpatient Hospital Stay (HOSPITAL_COMMUNITY)
Admission: AD | Admit: 2018-09-13 | Discharge: 2018-09-13 | Disposition: A | Discharge status: Home / Self Care | Payer: Medicaid Other | Source: Ambulatory Visit | Attending: Obstetrics and Gynecology | Admitting: Obstetrics and Gynecology

## 2018-09-13 DIAGNOSIS — Z3480 Encounter for supervision of other normal pregnancy, unspecified trimester: Secondary | ICD-10-CM

## 2018-09-13 DIAGNOSIS — J069 Acute upper respiratory infection, unspecified: Secondary | ICD-10-CM

## 2018-09-13 DIAGNOSIS — O99513 Diseases of the respiratory system complicating pregnancy, third trimester: Secondary | ICD-10-CM | POA: Insufficient documentation

## 2018-09-13 DIAGNOSIS — Z3A38 38 weeks gestation of pregnancy: Secondary | ICD-10-CM

## 2018-09-13 DIAGNOSIS — J101 Influenza due to other identified influenza virus with other respiratory manifestations: Secondary | ICD-10-CM | POA: Diagnosis not present

## 2018-09-13 DIAGNOSIS — O26843 Uterine size-date discrepancy, third trimester: Secondary | ICD-10-CM

## 2018-09-13 DIAGNOSIS — R05 Cough: Secondary | ICD-10-CM | POA: Diagnosis present

## 2018-09-13 LAB — URINALYSIS, ROUTINE W REFLEX MICROSCOPIC
BILIRUBIN URINE: NEGATIVE
Glucose, UA: NEGATIVE mg/dL
HGB URINE DIPSTICK: NEGATIVE
KETONES UR: NEGATIVE mg/dL
Leukocytes, UA: NEGATIVE
NITRITE: NEGATIVE
Protein, ur: NEGATIVE mg/dL
Specific Gravity, Urine: 1.005 (ref 1.005–1.030)
pH: 7 (ref 5.0–8.0)

## 2018-09-13 LAB — CBC WITH DIFFERENTIAL/PLATELET
BASOS ABS: 0 10*3/uL (ref 0.0–0.1)
Basophils Relative: 0 %
Eosinophils Absolute: 0 10*3/uL (ref 0.0–0.5)
Eosinophils Relative: 0 %
HEMATOCRIT: 35.3 % — AB (ref 36.0–46.0)
Hemoglobin: 11.6 g/dL — ABNORMAL LOW (ref 12.0–15.0)
Lymphocytes Relative: 9 %
Lymphs Abs: 1 10*3/uL (ref 0.7–4.0)
MCH: 30.2 pg (ref 26.0–34.0)
MCHC: 32.9 g/dL (ref 30.0–36.0)
MCV: 91.9 fL (ref 80.0–100.0)
Monocytes Absolute: 1.4 10*3/uL — ABNORMAL HIGH (ref 0.1–1.0)
Monocytes Relative: 13 %
NEUTROS PCT: 78 %
NRBC: 0 % (ref 0.0–0.2)
Neutro Abs: 8.2 10*3/uL — ABNORMAL HIGH (ref 1.7–7.7)
Platelets: 271 10*3/uL (ref 150–400)
RBC: 3.84 MIL/uL — ABNORMAL LOW (ref 3.87–5.11)
RDW: 13.4 % (ref 11.5–15.5)
WBC: 10.6 10*3/uL — ABNORMAL HIGH (ref 4.0–10.5)

## 2018-09-13 LAB — INFLUENZA PANEL BY PCR (TYPE A & B)
INFLAPCR: NEGATIVE
INFLBPCR: POSITIVE — AB

## 2018-09-13 MED ORDER — LACTATED RINGERS IV BOLUS
1000.0000 mL | Freq: Once | INTRAVENOUS | Status: AC
  Administered 2018-09-13: 1000 mL via INTRAVENOUS

## 2018-09-13 MED ORDER — OSELTAMIVIR PHOSPHATE 75 MG PO CAPS: 75.0000 mg | ORAL_CAPSULE | Freq: Two times a day (BID) | ORAL | 0 refills | Status: AC

## 2018-09-13 MED ORDER — ALBUTEROL SULFATE HFA 108 (90 BASE) MCG/ACT IN AERS: 1.0000 | INHALATION_SPRAY | RESPIRATORY_TRACT | 3 refills | Status: DC | PRN

## 2018-09-13 NOTE — Discharge Instructions (Signed)

## 2018-09-13 NOTE — MAU Note (Signed)
Pt presents with flu symptoms that began last night that have progressively gotten worse.  Reports productive cough, body aches, questionable fever. Denies VB or LOF.  Reports +FM.

## 2018-09-13 NOTE — MAU Provider Note (Signed)
Chief Complaint:  Flu symptoms   First Provider Initiated Contact with Patient 09/13/18 1445      HPI: [name] is a 27 y.o. G3P0020 at [redacted]w[redacted]d pt of [facility] who presents to maternity admissions reporting flu like symptoms x 24 hours. She reports that her boyfriend had similar symptoms and did go to Urgent Care but it was 48 hours after onset of symptoms so he was not tested for influenza.  She has body aches, chills, low grade fevers at home, sore throat, and productive cough. She has not tried any treatments. There are no other symptoms. She reports irregular light cramping, no regular contractions and her baby is moving well.    HPI  Past Medical History: Past Medical History:  Diagnosis Date  . Anemia   . Anxiety   . Asthma    exercise induced  . Complication of anesthesia   . Head injury, closed    scalp laceration, dizzy for a few minutes  . History of chlamydia   . PONV (postoperative nausea and vomiting)   . Von Willebrand disease (HCC)     Past obstetric history: OB History  Gravida Para Term Preterm AB Living  3       2    SAB TAB Ectopic Multiple Live Births  2            # Outcome Date GA Lbr Len/2nd Weight Sex Delivery Anes PTL Lv  3 Current           2 SAB 12/18/16 [redacted]w[redacted]d    SAB     1 SAB 03/28/16 [redacted]w[redacted]d    SAB       Past Surgical History: Past Surgical History:  Procedure Laterality Date  . HAND SURGERY Left 2009   thumb , fracture  . HAND SURGERY Left 2006   Pins placed  . HARDWARE REMOVAL Left 02/10/2016   Procedure: LEFT SMALL FINGER DEEP IMPLANT REMOVAL;  Surgeon: Bradly Bienenstock, MD;  Location: [facility] OR;  Service: Orthopedics;  Laterality: Left;  . OPEN REDUCTION INTERNAL FIXATION (ORIF) PROXIMAL PHALANX Left 01/06/2016   Procedure: OPEN REDUCTION INTERNAL FIXATION (ORIF) LEFT SMALL FINGER;  Surgeon: Bradly Bienenstock, MD;  Location: [facility] OR;  Service: Orthopedics;  Laterality: Left;    Family History: Family History  Problem Relation Age of Onset  . Diabetes  Father   . Hypertension Father   . Hypercholesterolemia Father   . Heart disease Father   . Cancer Paternal Grandfather   . Cancer Paternal Grandmother     Social History: Social History   Tobacco Use  . Smoking status: Never Smoker  . Smokeless tobacco: Never Used  Substance Use Topics  . Alcohol use: Not Currently    Comment: Currently no drinking due to pregnancy  . Drug use: No    Allergies:  Allergies  Allergen Reactions  . Bee Venom Anaphylaxis  . Adhesive [Tape] Itching and Rash    Meds:  Medications Prior to Admission  Medication Sig Dispense Refill Last Dose  . acetaminophen (TYLENOL) 325 MG tablet Take 650 mg by mouth as needed.   Taking  . albuterol (PROVENTIL HFA;VENTOLIN HFA) 108 (90 Base) MCG/ACT inhaler Inhale 1-2 puffs into the lungs every 6 (six) hours as needed for wheezing or shortness of breath.    Taking  . cyclobenzaprine (FLEXERIL) 10 MG tablet Take 1 tablet (10 mg total) by mouth 2 (two) times daily as needed for muscle spasms. 20 tablet 0 Taking  . EPINEPHrine 0.3 mg/0.3 mL  IJ SOAJ injection Inject 0.3 mLs (0.3 mg total) into the muscle once as needed. As needed for bee stings 2 Device 0 Taking  . ferrous fumarate (HEMOCYTE - 106 MG FE) 325 (106 Fe) MG TABS tablet Take 1 tablet (106 mg of iron total) by mouth 2 (two) times daily. 60 each 1 Taking  . Prenatal Vit-Fe Fumarate-FA (PRENATAL MULTIVITAMIN) TABS tablet Take 1 tablet by mouth at bedtime.   Taking  . terconazole (TERAZOL 7) 0.4 % vaginal cream Place 1 applicator vaginally at bedtime. (Patient not taking: Reported on 09/03/2018) 45 g 0 Not Taking    ROS:  Review of Systems  Constitutional: Positive for chills. Negative for fatigue and fever.  HENT: Positive for congestion and sore throat.   Eyes: Negative for visual disturbance.  Respiratory: Positive for cough. Negative for shortness of breath.   Cardiovascular: Negative for chest pain.  Gastrointestinal: Negative for abdominal pain,  nausea and vomiting.  Genitourinary: Negative for difficulty urinating, dysuria, flank pain, pelvic pain, vaginal bleeding, vaginal discharge and vaginal pain.  Musculoskeletal: Positive for myalgias.  Neurological: Negative for dizziness and headaches.  Psychiatric/Behavioral: Negative.      I have reviewed patient's Past Medical Hx, Surgical Hx, Family Hx, Social Hx, medications and allergies.   Physical Exam   Patient Vitals for the past 24 hrs:  BP Temp Temp src Pulse Resp SpO2 Height Weight  09/13/18 1309 110/67 99.8 F (37.7 C) Oral (!) 112 20 97 % 5\' 3"  (1.6 m) 102.5 kg   Constitutional: Well-developed, well-nourished female in no acute distress.  Cardiovascular: normal rate Respiratory: normal effort GI: Abd soft, non-tender, gravid appropriate for gestational age.  MS: Extremities nontender, no edema, normal ROM Neurologic: Alert and oriented x 4.  GU: Neg CVAT.  PELVIC EXAM:     FHT:  Upon arrival, FHR baseline 165 with moderate variability and 10 x 10 accels, no 15 x 15 accels.   IV fluids started and shortly afterwards FHR baseline 155  moderate variability, accelerations present, no decelerations Contractions: irregular, mild to palpation   Labs: Results for orders placed or performed during the hospital encounter of 09/13/18 (from the past 24 hour(s))  Urinalysis, Routine w reflex microscopic     Status: None   Collection Time: 09/13/18  1:08 PM  Result Value Ref Range   Color, Urine YELLOW YELLOW   APPearance CLEAR CLEAR   Specific Gravity, Urine 1.005 1.005 - 1.030   pH 7.0 5.0 - 8.0   Glucose, UA NEGATIVE NEGATIVE mg/dL   Hgb urine dipstick NEGATIVE NEGATIVE   Bilirubin Urine NEGATIVE NEGATIVE   Ketones, ur NEGATIVE NEGATIVE mg/dL   Protein, ur NEGATIVE NEGATIVE mg/dL   Nitrite NEGATIVE NEGATIVE   Leukocytes, UA NEGATIVE NEGATIVE  CBC with Differential/Platelet     Status: Abnormal   Collection Time: 09/13/18  2:45 PM  Result Value Ref Range   WBC  10.6 (H) 4.0 - 10.5 K/uL   RBC 3.84 (L) 3.87 - 5.11 MIL/uL   Hemoglobin 11.6 (L) 12.0 - 15.0 g/dL   HCT 45.435.3 (L) 09.836.0 - 11.946.0 %   MCV 91.9 80.0 - 100.0 fL   MCH 30.2 26.0 - 34.0 pg   MCHC 32.9 30.0 - 36.0 g/dL   RDW 14.713.4 82.911.5 - 56.215.5 %   Platelets 271 150 - 400 K/uL   nRBC 0.0 0.0 - 0.2 %   Neutrophils Relative % 78 %   Neutro Abs 8.2 (H) 1.7 - 7.7 K/uL   Lymphocytes Relative 9 %  Lymphs Abs 1.0 0.7 - 4.0 K/uL   Monocytes Relative 13 %   Monocytes Absolute 1.4 (H) 0.1 - 1.0 K/uL   Eosinophils Relative 0 %   Eosinophils Absolute 0.0 0.0 - 0.5 K/uL   Basophils Relative 0 %   Basophils Absolute 0.0 0.0 - 0.1 K/uL   B/Positive/-- (06/03 0935)  Imaging:    MAU Course/MDM: I have ordered labs and reviewed results. CBC with no elevated WBCs. UA wnl. NST reviewed and reactive following IV fluids Pt positive today for influenza b.  Will treat with Tamiflu and supportive measures.  Safe OTC meds in pregnancy list reviewed. D/C home Appt tomorrow rescheduled for early next week Pt discharge with strict return precautions.   Assessment:  1. Influenza B   2. Supervision of other normal pregnancy, antepartum   3. Acute upper respiratory infection   4. Pregnancy with 38 completed weeks gestation     Plan: Discharge home Labor precautions and fetal kick counts    Sharen Counter Certified Nurse-Midwife 09/13/2018 3:01 PM

## 2018-09-14 ENCOUNTER — Inpatient Hospital Stay (HOSPITAL_COMMUNITY)
Admission: AD | Admit: 2018-09-14 | Discharge: 2018-09-17 | DRG: 807 | Disposition: A | Discharge status: Home / Self Care | Payer: Medicaid Other | Attending: Obstetrics and Gynecology | Admitting: Obstetrics and Gynecology

## 2018-09-14 ENCOUNTER — Encounter: Payer: Medicaid Other | Admitting: Obstetrics and Gynecology

## 2018-09-14 ENCOUNTER — Encounter (HOSPITAL_COMMUNITY): Payer: Self-pay

## 2018-09-14 ENCOUNTER — Inpatient Hospital Stay (HOSPITAL_COMMUNITY)
Admission: AD | Admit: 2018-09-14 | Discharge: 2018-09-14 | Disposition: A | Discharge status: Home / Self Care | Payer: Medicaid Other | Source: Ambulatory Visit | Attending: Obstetrics and Gynecology | Admitting: Obstetrics and Gynecology

## 2018-09-14 ENCOUNTER — Encounter (HOSPITAL_COMMUNITY): Payer: Self-pay | Admitting: *Deleted

## 2018-09-14 DIAGNOSIS — O479 False labor, unspecified: Secondary | ICD-10-CM

## 2018-09-14 DIAGNOSIS — O3663X0 Maternal care for excessive fetal growth, third trimester, not applicable or unspecified: Secondary | ICD-10-CM | POA: Diagnosis present

## 2018-09-14 DIAGNOSIS — Z3A00 Weeks of gestation of pregnancy not specified: Secondary | ICD-10-CM | POA: Insufficient documentation

## 2018-09-14 DIAGNOSIS — Z3A39 39 weeks gestation of pregnancy: Secondary | ICD-10-CM

## 2018-09-14 DIAGNOSIS — J45909 Unspecified asthma, uncomplicated: Secondary | ICD-10-CM | POA: Diagnosis present

## 2018-09-14 DIAGNOSIS — O9952 Diseases of the respiratory system complicating childbirth: Principal | ICD-10-CM | POA: Diagnosis present

## 2018-09-14 DIAGNOSIS — J101 Influenza due to other identified influenza virus with other respiratory manifestations: Secondary | ICD-10-CM

## 2018-09-14 DIAGNOSIS — O26843 Uterine size-date discrepancy, third trimester: Secondary | ICD-10-CM

## 2018-09-14 DIAGNOSIS — J111 Influenza due to unidentified influenza virus with other respiratory manifestations: Secondary | ICD-10-CM | POA: Diagnosis present

## 2018-09-14 DIAGNOSIS — Z3480 Encounter for supervision of other normal pregnancy, unspecified trimester: Secondary | ICD-10-CM

## 2018-09-14 LAB — POCT FERN TEST: POCT Fern Test: NEGATIVE

## 2018-09-14 NOTE — MAU Note (Signed)
Pt states tested positive for flu yesterday. Started Tamaflu today

## 2018-09-14 NOTE — MAU Note (Signed)
Pt states she believes she lost her mucous plug beginning last night, describes it as pink-brown mucous discharge.  States she began feeling mild intermittent lower abdm cramping x 3 days that has become more frequent & intense since then; describes them as [id number] & states them as 6 on 0-10 pain scale.  Endorses +FM.  Denies vag bleeding.

## 2018-09-14 NOTE — Discharge Instructions (Signed)
Braxton Hicks Contractions °Contractions of the uterus can occur throughout pregnancy, but they are not always a sign that you are in labor. You may have practice contractions called Braxton Hicks contractions. These false labor contractions are sometimes confused with true labor. °What are Braxton Hicks contractions? °Braxton Hicks contractions are tightening movements that occur in the muscles of the uterus before labor. Unlike true labor contractions, these contractions do not result in opening (dilation) and thinning of the cervix. Toward the end of pregnancy (32-34 weeks), Braxton Hicks contractions can happen more often and may become stronger. These contractions are sometimes difficult to tell apart from true labor because they can be very uncomfortable. You should not feel embarrassed if you go to the hospital with false labor. °Sometimes, the only way to tell if you are in true labor is for your health care provider to look for changes in the cervix. The health care provider will do a physical exam and may monitor your contractions. If you are not in true labor, the exam should show that your cervix is not dilating and your water has not broken. °If there are other health problems associated with your pregnancy, it is completely safe for you to be sent home with false labor. You may continue to have Braxton Hicks contractions until you go into true labor. °How to tell the difference between true labor and false labor °True labor °· Contractions last 30-70 seconds. °· Contractions become very regular. °· Discomfort is usually felt in the top of the uterus, and it spreads to the lower abdomen and low back. °· Contractions do not go away with walking. °· Contractions usually become more intense and increase in frequency. °· The cervix dilates and gets thinner. °False labor °· Contractions are usually shorter and not as strong as true labor contractions. °· Contractions are usually irregular. °· Contractions  are often felt in the front of the lower abdomen and in the groin. °· Contractions may go away when you walk around or change positions while lying down. °· Contractions get weaker and are shorter-lasting as time goes on. °· The cervix usually does not dilate or become thin. °Follow these instructions at home: °· Take over-the-counter and prescription medicines only as told by your health care provider. °· Keep up with your usual exercises and follow other instructions from your health care provider. °· Eat and drink lightly if you think you are going into labor. °· If Braxton Hicks contractions are making you uncomfortable: °? Change your position from lying down or resting to walking, or change from walking to resting. °? Sit and rest in a tub of warm water. °? Drink enough fluid to keep your urine pale yellow. Dehydration may cause these contractions. °? Do slow and deep breathing several times an hour. °· Keep all follow-up prenatal visits as told by your health care provider. This is important. °Contact a health care provider if: °· You have a fever. °· You have continuous pain in your abdomen. °Get help right away if: °· Your contractions become stronger, more regular, and closer together. °· You have fluid leaking or gushing from your vagina. °· You pass blood-tinged mucus (bloody show). °· You have bleeding from your vagina. °· You have low back pain that you never had before. °· You feel your baby’s head pushing down and causing pelvic pressure. °· Your baby is not moving inside you as much as it used to. °Summary °· Contractions that occur before labor are called Braxton   Hicks contractions, false labor, or practice contractions. °· Braxton Hicks contractions are usually shorter, weaker, farther apart, and less regular than true labor contractions. True labor contractions usually become progressively stronger and regular and they become more frequent. °· Manage discomfort from Braxton Hicks contractions by  changing position, resting in a warm bath, drinking plenty of water, or practicing deep breathing. °This information is not intended to replace advice given to you by your health care provider. Make sure you discuss any questions you have with your health care provider. °Document Released: 02/09/2017 Document Revised: 02/09/2017 Document Reviewed: 02/09/2017 °Elsevier Interactive Patient Education © 2018 Elsevier Inc. ° °

## 2018-09-15 ENCOUNTER — Inpatient Hospital Stay (HOSPITAL_COMMUNITY): Payer: Medicaid Other | Admitting: Anesthesiology

## 2018-09-15 ENCOUNTER — Other Ambulatory Visit: Payer: Self-pay

## 2018-09-15 DIAGNOSIS — J45909 Unspecified asthma, uncomplicated: Secondary | ICD-10-CM | POA: Diagnosis present

## 2018-09-15 DIAGNOSIS — O9952 Diseases of the respiratory system complicating childbirth: Secondary | ICD-10-CM | POA: Diagnosis present

## 2018-09-15 DIAGNOSIS — Z3483 Encounter for supervision of other normal pregnancy, third trimester: Secondary | ICD-10-CM | POA: Diagnosis present

## 2018-09-15 DIAGNOSIS — O3663X0 Maternal care for excessive fetal growth, third trimester, not applicable or unspecified: Secondary | ICD-10-CM | POA: Diagnosis present

## 2018-09-15 DIAGNOSIS — Z3A39 39 weeks gestation of pregnancy: Secondary | ICD-10-CM | POA: Diagnosis not present

## 2018-09-15 DIAGNOSIS — J111 Influenza due to unidentified influenza virus with other respiratory manifestations: Secondary | ICD-10-CM | POA: Diagnosis present

## 2018-09-15 LAB — CBC
HEMATOCRIT: 38.3 % (ref 36.0–46.0)
Hemoglobin: 12.4 g/dL (ref 12.0–15.0)
MCH: 30 pg (ref 26.0–34.0)
MCHC: 32.4 g/dL (ref 30.0–36.0)
MCV: 92.7 fL (ref 80.0–100.0)
Platelets: 270 10*3/uL (ref 150–400)
RBC: 4.13 MIL/uL (ref 3.87–5.11)
RDW: 13.7 % (ref 11.5–15.5)
WBC: 17.3 10*3/uL — AB (ref 4.0–10.5)
nRBC: 0 % (ref 0.0–0.2)

## 2018-09-15 LAB — TYPE AND SCREEN
ABO/RH(D): B POS
Antibody Screen: NEGATIVE

## 2018-09-15 LAB — RPR: RPR Ser Ql: NONREACTIVE

## 2018-09-15 MED ORDER — FENTANYL 2.5 MCG/ML BUPIVACAINE 1/10 % EPIDURAL INFUSION (WH - ANES)
14.0000 mL/h | INTRAMUSCULAR | Status: DC | PRN
  Administered 2018-09-15: 14 mL/h via EPIDURAL

## 2018-09-15 MED ORDER — LIDOCAINE HCL (PF) 1 % IJ SOLN
INTRAMUSCULAR | Status: DC | PRN
  Administered 2018-09-15: 13 mL via EPIDURAL

## 2018-09-15 MED ORDER — EPHEDRINE 5 MG/ML INJ
10.0000 mg | INTRAVENOUS | Status: DC | PRN
  Filled 2018-09-15: qty 2

## 2018-09-15 MED ORDER — TERBUTALINE SULFATE 1 MG/ML IJ SOLN
0.2500 mg | Freq: Once | INTRAMUSCULAR | Status: DC | PRN
  Filled 2018-09-15: qty 1

## 2018-09-15 MED ORDER — TETANUS-DIPHTH-ACELL PERTUSSIS 5-2.5-18.5 LF-MCG/0.5 IM SUSP: 0.5000 mL | Freq: Once | INTRAMUSCULAR | Status: DC

## 2018-09-15 MED ORDER — BUTORPHANOL TARTRATE 1 MG/ML IJ SOLN
2.0000 mg | Freq: Once | INTRAMUSCULAR | Status: AC
  Administered 2018-09-15: 2 mg via INTRAVENOUS
  Filled 2018-09-15: qty 2

## 2018-09-15 MED ORDER — IBUPROFEN 600 MG PO TABS
600.0000 mg | ORAL_TABLET | Freq: Four times a day (QID) | ORAL | Status: DC
  Administered 2018-09-15 – 2018-09-16 (×3): 600 mg via ORAL
  Filled 2018-09-15 (×5): qty 1

## 2018-09-15 MED ORDER — PHENYLEPHRINE 40 MCG/ML (10ML) SYRINGE FOR IV PUSH (FOR BLOOD PRESSURE SUPPORT)
80.0000 ug | PREFILLED_SYRINGE | INTRAVENOUS | Status: DC | PRN
  Filled 2018-09-15: qty 10

## 2018-09-15 MED ORDER — OXYCODONE-ACETAMINOPHEN 5-325 MG PO TABS: 1.0000 | ORAL_TABLET | ORAL | Status: DC | PRN

## 2018-09-15 MED ORDER — ZOLPIDEM TARTRATE 5 MG PO TABS: 5.0000 mg | ORAL_TABLET | Freq: Every evening | ORAL | Status: DC | PRN

## 2018-09-15 MED ORDER — DIPHENHYDRAMINE HCL 25 MG PO CAPS: 25.0000 mg | ORAL_CAPSULE | Freq: Four times a day (QID) | ORAL | Status: DC | PRN

## 2018-09-15 MED ORDER — WITCH HAZEL-GLYCERIN EX PADS
1.0000 "application " | MEDICATED_PAD | CUTANEOUS | Status: DC | PRN
  Administered 2018-09-15 (×2): 1 via TOPICAL

## 2018-09-15 MED ORDER — ACETAMINOPHEN 325 MG PO TABS: 650.0000 mg | ORAL_TABLET | ORAL | Status: DC | PRN

## 2018-09-15 MED ORDER — OXYTOCIN 40 UNITS IN LACTATED RINGERS INFUSION - SIMPLE MED
2.5000 [IU]/h | INTRAVENOUS | Status: DC
  Administered 2018-09-15: 2.5 [IU]/h via INTRAVENOUS
  Filled 2018-09-15: qty 1000

## 2018-09-15 MED ORDER — OXYTOCIN 40 UNITS IN LACTATED RINGERS INFUSION - SIMPLE MED
1.0000 m[IU]/min | INTRAVENOUS | Status: DC
  Administered 2018-09-15: 2 m[IU]/min via INTRAVENOUS

## 2018-09-15 MED ORDER — SENNOSIDES-DOCUSATE SODIUM 8.6-50 MG PO TABS
2.0000 | ORAL_TABLET | ORAL | Status: DC
  Administered 2018-09-16 – 2018-09-17 (×2): 2 via ORAL
  Filled 2018-09-15 (×2): qty 2

## 2018-09-15 MED ORDER — SOD CITRATE-CITRIC ACID 500-334 MG/5ML PO SOLN: 30.0000 mL | ORAL | Status: DC | PRN

## 2018-09-15 MED ORDER — ONDANSETRON HCL 4 MG/2ML IJ SOLN: 4.0000 mg | Freq: Four times a day (QID) | INTRAMUSCULAR | Status: DC | PRN

## 2018-09-15 MED ORDER — OSELTAMIVIR PHOSPHATE 75 MG PO CAPS
75.0000 mg | ORAL_CAPSULE | Freq: Two times a day (BID) | ORAL | Status: DC
  Administered 2018-09-15 – 2018-09-17 (×5): 75 mg via ORAL
  Filled 2018-09-15: qty 1

## 2018-09-15 MED ORDER — ONDANSETRON HCL 4 MG PO TABS: 4.0000 mg | ORAL_TABLET | ORAL | Status: DC | PRN

## 2018-09-15 MED ORDER — ONDANSETRON HCL 4 MG/2ML IJ SOLN: 4.0000 mg | INTRAMUSCULAR | Status: DC | PRN

## 2018-09-15 MED ORDER — LACTATED RINGERS IV SOLN
500.0000 mL | Freq: Once | INTRAVENOUS | Status: AC
  Administered 2018-09-15: 500 mL via INTRAVENOUS

## 2018-09-15 MED ORDER — ALBUTEROL SULFATE (2.5 MG/3ML) 0.083% IN NEBU: 3.0000 mL | INHALATION_SOLUTION | RESPIRATORY_TRACT | Status: DC | PRN

## 2018-09-15 MED ORDER — COCONUT OIL OIL
1.0000 "application " | TOPICAL_OIL | Status: DC | PRN
  Administered 2018-09-17: 1 via TOPICAL
  Filled 2018-09-15: qty 120

## 2018-09-15 MED ORDER — PHENYLEPHRINE 40 MCG/ML (10ML) SYRINGE FOR IV PUSH (FOR BLOOD PRESSURE SUPPORT)
PREFILLED_SYRINGE | INTRAVENOUS | Status: AC
  Filled 2018-09-15: qty 10

## 2018-09-15 MED ORDER — FENTANYL CITRATE (PF) 100 MCG/2ML IJ SOLN
100.0000 ug | INTRAMUSCULAR | Status: DC | PRN
  Administered 2018-09-15: 100 ug via INTRAVENOUS
  Filled 2018-09-15: qty 2

## 2018-09-15 MED ORDER — LACTATED RINGERS IV SOLN
500.0000 mL | INTRAVENOUS | Status: DC | PRN
  Administered 2018-09-15 (×2): 500 mL via INTRAVENOUS

## 2018-09-15 MED ORDER — PROMETHAZINE HCL 25 MG/ML IJ SOLN
12.5000 mg | Freq: Once | INTRAMUSCULAR | Status: DC
  Filled 2018-09-15: qty 1

## 2018-09-15 MED ORDER — LACTATED RINGERS IV SOLN
INTRAVENOUS | Status: DC
  Administered 2018-09-15 (×3): via INTRAVENOUS

## 2018-09-15 MED ORDER — PRENATAL MULTIVITAMIN CH
1.0000 | ORAL_TABLET | Freq: Every day | ORAL | Status: DC
  Administered 2018-09-16 – 2018-09-17 (×2): 1 via ORAL
  Filled 2018-09-15 (×2): qty 1

## 2018-09-15 MED ORDER — DIBUCAINE 1 % RE OINT: 1.0000 "application " | TOPICAL_OINTMENT | RECTAL | Status: DC | PRN

## 2018-09-15 MED ORDER — BENZOCAINE-MENTHOL 20-0.5 % EX AERO
1.0000 "application " | INHALATION_SPRAY | CUTANEOUS | Status: DC | PRN
  Administered 2018-09-15: 1 via TOPICAL
  Filled 2018-09-15: qty 56

## 2018-09-15 MED ORDER — LACTATED RINGERS IV BOLUS
500.0000 mL | Freq: Once | INTRAVENOUS | Status: AC
  Administered 2018-09-15: 500 mL via INTRAVENOUS

## 2018-09-15 MED ORDER — LIDOCAINE HCL (PF) 1 % IJ SOLN
30.0000 mL | INTRAMUSCULAR | Status: AC | PRN
  Administered 2018-09-15: 30 mL via SUBCUTANEOUS
  Filled 2018-09-15: qty 30

## 2018-09-15 MED ORDER — SIMETHICONE 80 MG PO CHEW: 80.0000 mg | CHEWABLE_TABLET | ORAL | Status: DC | PRN

## 2018-09-15 MED ORDER — OXYCODONE-ACETAMINOPHEN 5-325 MG PO TABS: 2.0000 | ORAL_TABLET | ORAL | Status: DC | PRN

## 2018-09-15 MED ORDER — FENTANYL 2.5 MCG/ML BUPIVACAINE 1/10 % EPIDURAL INFUSION (WH - ANES)
INTRAMUSCULAR | Status: AC
  Filled 2018-09-15: qty 100

## 2018-09-15 MED ORDER — DIPHENHYDRAMINE HCL 50 MG/ML IJ SOLN: 12.5000 mg | INTRAMUSCULAR | Status: DC | PRN

## 2018-09-15 MED ORDER — OXYTOCIN BOLUS FROM INFUSION
500.0000 mL | Freq: Once | INTRAVENOUS | Status: AC
  Administered 2018-09-15: 500 mL via INTRAVENOUS

## 2018-09-15 MED ORDER — ACETAMINOPHEN 325 MG PO TABS
650.0000 mg | ORAL_TABLET | ORAL | Status: DC | PRN
  Administered 2018-09-15: 650 mg via ORAL
  Filled 2018-09-15: qty 2

## 2018-09-15 NOTE — Progress Notes (Signed)
Had mom facetime with her baby in the nursery.

## 2018-09-15 NOTE — Anesthesia Preprocedure Evaluation (Signed)
Anesthesia Evaluation  Patient identified by MRN, date of birth, ID band Patient awake    Reviewed: Allergy & Precautions, NPO status , Patient's Chart, lab work & pertinent test results  History of Anesthesia Complications (+) PONV and history of anesthetic complications  Airway Mallampati: II  TM Distance: >3 FB Neck ROM: Full    Dental  (+) Teeth Intact, Dental Advisory Given, Caps,    Pulmonary asthma ,    Pulmonary exam normal breath sounds clear to auscultation       Cardiovascular Exercise Tolerance: Good negative cardio ROS Normal cardiovascular exam Rhythm:Regular Rate:Normal     Neuro/Psych  Headaches, PSYCHIATRIC DISORDERS Anxiety    GI/Hepatic negative GI ROS, Neg liver ROS,   Endo/Other  negative endocrine ROS  Renal/GU negative Renal ROS     Musculoskeletal negative musculoskeletal ROS (+)   Abdominal (+) + obese,   Peds  Hematology negative hematology ROS (+)   Anesthesia Other Findings   Reproductive/Obstetrics (+) Pregnancy                             Anesthesia Physical  Anesthesia Plan  ASA: II  Anesthesia Plan: Epidural   Post-op Pain Management:    Induction:   PONV Risk Score and Plan:   Airway Management Planned:   Additional Equipment:   Intra-op Plan:   Post-operative Plan:   Informed Consent: I have reviewed the patients History and Physical, chart, labs and discussed the procedure including the risks, benefits and alternatives for the proposed anesthesia with the patient or authorized representative who has indicated his/her understanding and acceptance.     Plan Discussed with: CRNA and Anesthesiologist  Anesthesia Plan Comments:         Anesthesia Quick Evaluation

## 2018-09-15 NOTE — Progress Notes (Signed)
This RN called answering service for [facility] to inform them of upcoming birth of newborn while mom diagnosed with flu. Per [facility] with answering service, [facility] newborn separated from mom until MD able to see baby.

## 2018-09-15 NOTE — H&P (Signed)
Victoria CulverRachael Nguyen is a 27 y.o. female G3P0020 @ 39.0wks by LMP and confirmed by 6wk scan presenting for reg ctx. Having +bldy show but no leaking of fluid.  Her preg has been followed by the [facility] service and has been remarkable for 1) dx of flu on 12/5 (started Tamiflu on 12/6) 2) asthma 3) previous previa- resolved on 10/22 4) LGA infant EFW 4598 @ 38wks 5) GBS neg  OB History    Gravida  3   Para      Term      Preterm      AB  2   Living  0     SAB  2   TAB      Ectopic      Multiple      Live Births             Past Medical History:  Diagnosis Date  . Anemia   . Anxiety   . Asthma    exercise induced  . Complication of anesthesia   . Head injury, closed    scalp laceration, dizzy for a few minutes  . History of chlamydia   . PONV (postoperative nausea and vomiting)   . Von Willebrand disease (HCC)    Past Surgical History:  Procedure Laterality Date  . HAND SURGERY Left 2009   thumb , fracture  . HAND SURGERY Left 2006   Pins placed  . HARDWARE REMOVAL Left 02/10/2016   Procedure: LEFT SMALL FINGER DEEP IMPLANT REMOVAL;  Surgeon: Bradly BienenstockFred Ortmann, MD;  Location: [facility] OR;  Service: Orthopedics;  Laterality: Left;  . OPEN REDUCTION INTERNAL FIXATION (ORIF) PROXIMAL PHALANX Left 01/06/2016   Procedure: OPEN REDUCTION INTERNAL FIXATION (ORIF) LEFT SMALL FINGER;  Surgeon: Bradly BienenstockFred Ortmann, MD;  Location: [facility] OR;  Service: Orthopedics;  Laterality: Left;   Family History: family history includes Cancer in her paternal grandfather and paternal grandmother; Diabetes in her father; Heart disease in her father; Hypercholesterolemia in her father; Hypertension in her father. Social History:  reports that she has never smoked. She has never used smokeless tobacco. She reports that she drank alcohol. She reports that she does not use drugs.     Maternal Diabetes: No Genetic Screening: Normal Maternal Ultrasounds/Referrals: Normal Fetal Ultrasounds or other Referrals:  Other:   Maternal Substance Abuse:  No Significant Maternal Medications:  Meds include: Other: Tamiflu- first dose 12/6 Significant Maternal Lab Results:  Lab values include: Group B Strep negative Other Comments:  pt with + flu test 09/13/18  ROS History Dilation: 8 Effacement (%): 90 Station: -2 Exam by:: Philipp DeputyKim Shaw Blood pressure 135/72, pulse 88, temperature 98.6 F (37 C), temperature source Oral, resp. rate 18, last menstrual period 12/16/2017, unknown if currently breastfeeding. Exam Physical Exam  Constitutional: She is oriented to person, place, and time. She appears well-developed.  HENT:  Head: Normocephalic.  Neck: Normal range of motion.  Cardiovascular: Normal rate.  Respiratory: Effort normal.  GI:  EFM 120-130s, early variables, +LTV Ctx q 3-5 mins  Genitourinary:  Genitourinary Comments: Having sm clots/heavy bldy show  Musculoskeletal: Normal range of motion.  Neurological: She is alert and oriented to person, place, and time.  Skin: Skin is warm and dry.  Psychiatric: She has a normal mood and affect. Her behavior is normal. Thought content normal.    Prenatal labs: ABO, Rh: --/--/B POS (12/07 0053) Antibody: NEG (12/07 0053) Rubella: 1.02 (06/03 0935) RPR: Non Reactive (10/03 0908)  HBsAg: Negative (06/03 0935)  HIV: Non Reactive (  10/03 0908)  GBS:   neg 11/19  Assessment/Plan: IUP@39 .0wks Active labor Mod vag bldg + influenza GBS neg  Admit to [facility] Expectant management Plans epidural placement Anticipate SVD Prepare for LGA delivery On droplet precautions; will follow peds plan for infant contact after delivery with + flu situation    [name] CNM 09/15/2018, 3:56 AM

## 2018-09-15 NOTE — Lactation Note (Signed)
This note was copied from a baby's chart. Lactation Consultation Note  Patient Name: Victoria Montel CulverRachael Galano UJWJX'BToday's Date: 09/15/2018 Reason for consult: Initial assessment;Primapara;1st time breastfeeding;Other (Comment);Term(Mother has the flu, baby in the nursery)  8 hours old FT female who is being partially BF and formula fed in the hospital nursery. Mom is a P1 and has the flu, baby had to be separated from mother as a precaution measure. Mom has already started pumping today, she's pumped twice, and already turning her snappies with breastmilk to her RN. LC took the last snappie to the nursery, nursery RN's told LC they'll be feeding it to baby. Mom doesn't have a pump at home.  Mom is already familiar with hand expression and able to get colostrum, the last snappie her RN took to the nursery was obtained through hand expression. Mom was in the middle of pumping when entering the room, when session was over, she was disappointed she didn't get any volume like before. Praised her for her efforts and reminded her that pumping at this stage is mainly for breast stimulation, baby is already on [employer]er[employer] Gentle. Mom was complaining about cramping while pumping, advised her to use the restroom first prior pumping.  Feeding plan:  1. Encouraged mom to pump every 3 hours and at least once at night, a minimum of 8 pumping sessions in 24 hours 2. Mom will continue turning all her EBM to her RN to be taken to her baby in the nursery 3. Once her MD clears her out from the flu, she can start putting baby to the breast   BF brochure, BF resources and feeding diary were reviewed. Mom reported all questions and concerns were answered, she's aware of LC services and will call PRN.  Maternal Data Formula Feeding for Exclusion: No Has patient been taught Hand Expression?: Yes Does the patient have breastfeeding experience prior to this delivery?: No  Feeding Feeding Type: Breast  Milk  Interventions Interventions: Breast feeding basics reviewed;DEBP  Lactation Tools Discussed/Used Tools: Pump Breast pump type: Double-Electric Breast Pump WIC Program: No Pump Review: Setup, frequency, and cleaning;Milk Storage Initiated by:: RN and MPeck IBCLC adjusted junctures in pump; they're loose Date initiated:: 09/15/18   Consult Status Consult Status: PRN Date: 09/16/18 Follow-up type: In-patient    Rodolfo Notaro Venetia ConstableS Jaun Galluzzo 09/15/2018, 6:48 PM

## 2018-09-15 NOTE — Anesthesia Procedure Notes (Signed)
Epidural Patient location during procedure: OB Start time: 09/15/2018 3:58 AM End time: 09/15/2018 4:11 AM  Staffing Anesthesiologist: Lowella CurbMiller, Loribeth Katich Ray, MD Performed: anesthesiologist   Preanesthetic Checklist Completed: patient identified, site marked, surgical consent, pre-op evaluation, timeout performed, IV checked, risks and benefits discussed and monitors and equipment checked  Epidural Patient position: sitting Prep: ChloraPrep Patient monitoring: heart rate, cardiac monitor, continuous pulse ox and blood pressure Approach: midline Location: L2-L3 Injection technique: LOR saline  Needle:  Needle type: Tuohy  Needle gauge: 17 G Needle length: 9 cm Needle insertion depth: 6 cm Catheter type: closed end flexible Catheter size: 20 Guage Catheter at skin depth: 12 cm Test dose: negative  Assessment Events: blood not aspirated, injection not painful, no injection resistance, negative IV test and no paresthesia  Additional Notes Reason for block:procedure for pain

## 2018-09-15 NOTE — Anesthesia Pain Management Evaluation Note (Signed)
  [facility] Pain Management Visit Note  Patient: [name], 27 y.o., female  "Hello I am a [facility] of the anesthesia [facility] at [facility]. We have an anesthesia team available at all times to provide care throughout the hospital, including epidural management and anesthesia for C-section. I don't know your plan for the delivery whether it a natural birth, water birth, IV sedation, nitrous supplementation, doula or epidural, but we want to meet your pain goals."   1.Was your pain managed to your expectations on prior hospitalizations?   No prior hospitalizations  2.What is your expectation for pain management during this hospitalization?     Epidural  3.How can we help you reach that goal? Epidural in place at time of visit  Record the patient's initial score and the patient's pain goal.   Pain: Patient sleeping - unable to assess  Pain Goal: Patient sleeping - unable to assess [facility] wants you to be able to say your pain was always managed very well.  Rica RecordsICKELTON,Dio Giller 09/15/2018

## 2018-09-15 NOTE — Progress Notes (Addendum)
Patient ID: Montel CulverRachael Dalsanto, female   DOB: 08/07/1991, 27 y.o.   MRN: [id number]  Was complete at 0515; laboring down and feeling a little pressure  BP 114/71 FHR 130s, +accels, no decels, Cat 1 Ctx q 3-4 mins spont Cx C/C/vtx -1  IUP@term  End 1st stage LGA infant +flu  Will allow to labor down until the urge to push is strong Anticipate SVD, but will be prepared for LGA delivery Peds recommends that infant be taken to nursery right after delivery- pt agreeable  Arabella MerlesKimberly D Shaw CNM 09/15/2018

## 2018-09-16 MED ORDER — PSEUDOEPHEDRINE HCL ER 120 MG PO TB12
120.0000 mg | ORAL_TABLET | Freq: Two times a day (BID) | ORAL | Status: DC
  Administered 2018-09-16 (×2): 120 mg via ORAL
  Filled 2018-09-16 (×5): qty 1

## 2018-09-16 NOTE — Anesthesia Postprocedure Evaluation (Signed)
Anesthesia Post Note  Patient: Victoria CulverRachael Nguyen  Procedure(s) Performed: AN AD HOC LABOR EPIDURAL     Patient location during evaluation: Mother Baby Anesthesia Type: Epidural Level of consciousness: awake and alert Pain management: pain level controlled Vital Signs Assessment: post-procedure vital signs reviewed and stable Respiratory status: spontaneous breathing, nonlabored ventilation and respiratory function stable Cardiovascular status: stable Postop Assessment: no headache, no backache, epidural receding, able to ambulate, adequate PO intake, no apparent nausea or vomiting and patient able to bend at knees Anesthetic complications: no    Last Vitals:  Vitals:   09/16/18 0230 09/16/18 0542  BP: 108/60 95/63  Pulse: 77 79  Resp: 18 18  Temp: 36.5 C 36.5 C  SpO2: 99% 97%    Last Pain:  Vitals:   09/16/18 0546  TempSrc:   PainSc: 0-No pain   Pain Goal:                 [name],[name]

## 2018-09-16 NOTE — Progress Notes (Signed)
Had momma facetime with her baby, who was awake in nursery!

## 2018-09-17 ENCOUNTER — Encounter (HOSPITAL_COMMUNITY): Payer: Self-pay | Admitting: *Deleted

## 2018-09-17 MED ORDER — SENNOSIDES-DOCUSATE SODIUM 8.6-50 MG PO TABS: 2.0000 | ORAL_TABLET | ORAL | 0 refills | Status: DC

## 2018-09-17 MED ORDER — IBUPROFEN 600 MG PO TABS: 600.0000 mg | ORAL_TABLET | Freq: Four times a day (QID) | ORAL | 2 refills | Status: DC | PRN

## 2018-09-17 MED ORDER — IBUPROFEN 600 MG PO TABS: 600.0000 mg | ORAL_TABLET | Freq: Four times a day (QID) | ORAL | 0 refills | Status: DC

## 2018-09-17 MED ORDER — SENNOSIDES-DOCUSATE SODIUM 8.6-50 MG PO TABS: 2.0000 | ORAL_TABLET | Freq: Two times a day (BID) | ORAL | 2 refills | Status: AC | PRN

## 2018-09-17 NOTE — Progress Notes (Signed)
Mom has had normal temperatures for over 24 hours.  Dad says his temperature last night was 98.6; this nurse took it this morning, and it was 97.9

## 2018-09-17 NOTE — Discharge Instructions (Signed)
Vaginal Delivery, Care After °Refer to this sheet in the next few weeks. These instructions provide you with information about caring for yourself after vaginal delivery. Your health care provider may also give you more specific instructions. Your treatment has been planned according to current medical practices, but problems sometimes occur. Call your health care provider if you have any problems or questions. °What can I expect after the procedure? °After vaginal delivery, it is common to have: °· Some bleeding from your vagina. °· Soreness in your abdomen, your vagina, and the area of skin between your vaginal opening and your anus (perineum). °· Pelvic cramps. °· Fatigue. ° °Follow these instructions at home: °Medicines °· Take over-the-counter and prescription medicines only as told by your health care provider. °· If you were prescribed an antibiotic medicine, take it as told by your health care provider. Do not stop taking the antibiotic until it is finished. °Driving ° °· Do not drive or operate heavy machinery while taking prescription pain medicine. °· Do not drive for 24 hours if you received a sedative. °Lifestyle °· Do not drink alcohol. This is especially important if you are breastfeeding or taking medicine to relieve pain. °· Do not use tobacco products, including cigarettes, chewing tobacco, or e-cigarettes. If you need help quitting, ask your health care provider. °Eating and drinking °· Drink at least 8 eight-ounce glasses of water every day unless you are told not to by your health care provider. If you choose to breastfeed your baby, you may need to drink more water than this. °· Eat high-fiber foods every day. These foods may help prevent or relieve constipation. High-fiber foods include: °? Whole grain cereals and breads. °? Brown rice. °? Beans. °? Fresh fruits and vegetables. °Activity °· Return to your normal activities as told by your health care provider. Ask your health care provider  what activities are safe for you. °· Rest as much as possible. Try to rest or take a nap when your baby is sleeping. °· Do not lift anything that is heavier than your baby or 10 lb (4.5 kg) until your health care provider says that it is safe. °· Talk with your health care provider about when you can engage in sexual activity. This may depend on your: °? Risk of infection. °? Rate of healing. °? Comfort and desire to engage in sexual activity. °Vaginal Care °· If you have an episiotomy or a vaginal tear, check the area every day for signs of infection. Check for: °? More redness, swelling, or pain. °? More fluid or blood. °? Warmth. °? Pus or a bad smell. °· Do not use tampons or douches until your health care provider says this is safe. °· Watch for any blood clots that may pass from your vagina. These may look like clumps of dark red, brown, or black discharge. °General instructions °· Keep your perineum clean and dry as told by your health care provider. °· Wear loose, comfortable clothing. °· Wipe from front to back when you use the toilet. °· Ask your health care provider if you can shower or take a bath. If you had an episiotomy or a perineal tear during labor and delivery, your health care provider may tell you not to take baths for a certain length of time. °· Wear a bra that supports your breasts and fits you well. °· If possible, have someone help you with household activities and help care for your baby for at least a few days after   you leave the hospital. °· Keep all follow-up visits for you and your baby as told by your health care provider. This is important. °Contact a health care provider if: °· You have: °? Vaginal discharge that has a bad smell. °? Difficulty urinating. °? Pain when urinating. °? A sudden increase or decrease in the frequency of your bowel movements. °? More redness, swelling, or pain around your episiotomy or vaginal tear. °? More fluid or blood coming from your episiotomy or  vaginal tear. °? Pus or a bad smell coming from your episiotomy or vaginal tear. °? A fever. °? A rash. °? Little or no interest in activities you used to enjoy. °? Questions about caring for yourself or your baby. °· Your episiotomy or vaginal tear feels warm to the touch. °· Your episiotomy or vaginal tear is separating or does not appear to be healing. °· Your breasts are painful, hard, or turn red. °· You feel unusually sad or worried. °· You feel nauseous or you vomit. °· You pass large blood clots from your vagina. If you pass a blood clot from your vagina, save it to show to your health care provider. Do not flush blood clots down the toilet without having your health care provider look at them. °· You urinate more than usual. °· You are dizzy or light-headed. °· You have not breastfed at all and you have not had a menstrual period for 12 weeks after delivery. °· You have stopped breastfeeding and you have not had a menstrual period for 12 weeks after you stopped breastfeeding. °Get help right away if: °· You have: °? Pain that does not go away or does not get better with medicine. °? Chest pain. °? Difficulty breathing. °? Blurred vision or spots in your vision. °? Thoughts about hurting yourself or your baby. °· You develop pain in your abdomen or in one of your legs. °· You develop a severe headache. °· You faint. °· You bleed from your vagina so much that you fill two sanitary pads in one hour. °This information is not intended to replace advice given to you by your health care provider. Make sure you discuss any questions you have with your health care provider. °Document Released: 09/23/2000 Document Revised: 03/09/2016 Document Reviewed: 10/11/2015 °Elsevier Interactive Patient Education © 2018 Elsevier Inc. ° °

## 2018-09-17 NOTE — Lactation Note (Signed)
This note was copied from a baby'Nguyen chart. Lactation Consultation Note  Patient Name: Victoria Montel CulverRachael Elahi WUJWJ'XToday'Nguyen Date: 09/17/2018 Reason for consult: Follow-up assessment Mom is pumping every 3 hours and obtaining small amounts of colostrum for the baby.  Baby is in the nursery because mom was positive for the flu.  Mom and baby will be discharged today.  Pediatrician would like mom to pump and bottle feed the first week.  Lactation outpatient appointment recommended.  [facility]  Maternal Data    Feeding Feeding Type: Bottle Fed - Formula Nipple Type: Regular  LATCH Score                   Interventions    Lactation Tools Discussed/Used     Consult Status Consult Status: Complete Follow-up type: Call as needed    Huston FoleyMOULDEN, [name] 09/17/2018, 10:51 AM

## 2018-09-17 NOTE — Progress Notes (Signed)
Called into room; mom concerned of her "decreasing" milk supply while pumping--what can she do to increase.  Explained that it could be the tamiflu but more likely because of her illness; encouraged her to keep pumping. Also brought in her baby's blanket from nursery so that she could hold it while pumping.

## 2018-09-17 NOTE — Discharge Summary (Signed)
Postpartum Discharge Summary     Patient Name: [name] DOB: [birth year] MRN: [id number]  Date of admission: 09/14/2018 Delivering Provider: Sharen Counter A   Date of discharge: 09/17/2018  Admitting diagnosis: 39 WKS, CTX Intrauterine pregnancy: [redacted]w[redacted]d     Secondary diagnosis:  Active Problems:   Labor and delivery, indication for care   NSVD (normal spontaneous vaginal delivery)   Postpartum care following vaginal delivery  Additional problems: None     Discharge diagnosis: Term Pregnancy Delivered                                                                                                Post partum procedures:None  Augmentation: None  Complications: None  Hospital course:  Onset of Labor With Vaginal Delivery     27 y.o. yo G3P0020 at [redacted]w[redacted]d was admitted in Active Labor on 09/14/2018. Patient had an uncomplicated labor course as follows:  Membrane Rupture Time/Date: 3:35 AM ,09/15/2018   Intrapartum Procedures: Episiotomy: None [1]                                         Lacerations:  2nd degree [3]  Patient had a delivery of a Viable infant. 09/15/2018  Information for the patient's newborn:  Avleen, Atwal [[id number]]  Delivery Method: Vag-Spont    Pateint had an uncomplicated postpartum course.  She is ambulating, tolerating a regular diet, passing flatus, and urinating well. Patient is discharged home in stable condition on 09/17/18.   Magnesium Sulfate recieved: No BMZ received: No  Physical exam  Vitals:   09/16/18 0542 09/16/18 1404 09/16/18 2132 09/17/18 0629  BP: 95/63 103/64 109/70 112/75  Pulse: 79 87 79 70  Resp: 18 18 18 18   Temp: 97.7 F (36.5 C) 97.9 F (36.6 C) 98.4 F (36.9 C) 98.2 F (36.8 C)  TempSrc: Oral Oral Oral Oral  SpO2: 97% 98% 97% 97%  Weight:      Height:       General: alert, cooperative and no distress Lochia: appropriate Uterine Fundus: firm Incision: N/A DVT Evaluation: No evidence of DVT seen on physical  exam. Negative Homan's sign. No cords or calf tenderness. No significant calf/ankle edema. Labs: Lab Results  Component Value Date   WBC 17.3 (H) 09/15/2018   HGB 12.4 09/15/2018   HCT 38.3 09/15/2018   MCV 92.7 09/15/2018   PLT 270 09/15/2018   CMP Latest Ref Rng & Units 11/12/2013  Glucose 70 - 99 mg/dL 97  BUN 6 - 23 mg/dL 11  Creatinine 0.27 - 2.53 mg/dL 6.64  Sodium 403 - 474 mEq/L 137  Potassium 3.7 - 5.3 mEq/L 3.3(L)  Chloride 96 - 112 mEq/L 103  CO2 19 - 32 mEq/L 19  Calcium 8.4 - 10.5 mg/dL 8.6    Discharge instruction: per After Visit Summary and "Baby and Me Booklet".  After visit meds:  Allergies as of 09/17/2018      Reactions   Bee Venom Anaphylaxis   Adhesive [tape] Itching, Rash  Medication List    TAKE these medications   acetaminophen 325 MG tablet Commonly known as:  TYLENOL Take 650 mg by mouth every 6 (six) hours as needed for mild pain or headache.   albuterol 108 (90 Base) MCG/ACT inhaler Commonly known as:  PROVENTIL HFA;VENTOLIN HFA Inhale 1-2 puffs into the lungs every 4 (four) hours as needed for wheezing or shortness of breath.   cyclobenzaprine 10 MG tablet Commonly known as:  FLEXERIL Take 1 tablet (10 mg total) by mouth 2 (two) times daily as needed for muscle spasms.   EPINEPHrine 0.3 mg/0.3 mL Soaj injection Commonly known as:  EPI-PEN Inject 0.3 mLs (0.3 mg total) into the muscle once as needed. As needed for bee stings   FERROCITE 324 (106 Fe) MG Tabs tablet Generic drug:  Ferrous Fumarate Take 1 tablet by mouth daily.   ibuprofen 600 MG tablet Commonly known as:  ADVIL,MOTRIN Take 1 tablet (600 mg total) by mouth every 6 (six) hours.   oseltamivir 75 MG capsule Commonly known as:  TAMIFLU Take 1 capsule (75 mg total) by mouth 2 (two) times daily for 5 days.   prenatal multivitamin Tabs tablet Take 1 tablet by mouth at bedtime.   pseudoephedrine 120 MG 12 hr tablet Commonly known as:  SUDAFED Take 120 mg by mouth  2 (two) times daily.   senna-docusate 8.6-50 MG tablet Commonly known as:  Senokot-S Take 2 tablets by mouth daily for 7 days. Start taking on:  09/18/2018       Diet: routine diet  Activity: Advance as tolerated. Pelvic rest for 6 weeks.   Outpatient follow up:4 weeks Follow up Appt:No future appointments. Follow up Visit:   Please schedule this patient for Postpartum visit in: 4 weeks with the following provider: Any provider For C/S patients schedule nurse incision check in weeks 2 weeks: no Low risk pregnancy complicated by: None Delivery mode:  Vacuum Anticipated Birth Control:  Condoms PP Procedures needed: None  Schedule Integrated BH visit: no  Newborn Data: Live born female  Birth Weight: 8 lb 1.6 oz (3675 g) APGAR: 9, 9  Newborn Delivery   Birth date/time:  09/15/2018 10:34:00 Delivery type:  Vaginal, Spontaneous     Baby Feeding: Breast Disposition:home with mother   09/17/2018 Awilda Metro, MD

## 2018-09-18 ENCOUNTER — Telehealth: Payer: Self-pay | Admitting: Family Medicine

## 2018-09-18 NOTE — Telephone Encounter (Signed)
Called patient to get her scheduled for her pp visit. No answer, LVM with appt time and date. Left office nukber to call if needed to reschedule. Reminder mailed

## 2018-09-21 ENCOUNTER — Encounter: Payer: Medicaid Other | Admitting: Family Medicine

## 2018-10-05 ENCOUNTER — Ambulatory Visit: Payer: Self-pay

## 2018-10-05 NOTE — Lactation Note (Signed)
This note was copied from a baby's chart.  10/05/2018  Name: [name] MRN: [id number] Date of Birth: 09/15/2017 Gestational Age: Gestational Age: 3791w0d Birth Weight: 129.6 oz Weight today:    9 pounds 6.8 ounces (4274 grams) with clean newborn diaper  Sherral Hammersvah is a 82 week old infant who presents today with mom and dad for feeding assistance. Mom is concerned [name] is not latching consistently. Mom with Flu and mom and infant were separated the first few days of life. mom would like to get infant to BF full time.   [name] has gained 584 grams in the last 18 days with an average daily weight gain of 32 grams a day.   Mom reports infant is not wanting to latch consistently. Mom is offering after a bottle and when sleepy. Infant usually will only feed on the right breast. Worked with mom on latching and deeping latch.   Mom reports her right nipple is sore since infant only latching on that breast, infant pulling on and off the breast may be the cause of mom's discomfort on that breast. Mom denies shooting pain in the breast.  Mom reports pain with pumping, enc mom to lubricate nipple prior to pumping. Mom using # 24 flanges that seems to be a good fit for her. Infant with white on tongue, it does not appear to be thrush. Enc mom to begin boiling pump parts and bottles once a day and to let Ped know if tongue worsens. Infant with no patches in her mouth otherwise. No diaper rash noted. Mom using Lanolin to nipples prn. Enc mom to use Coconut Oil or Lanolin free nipple cream/ointment.   Infant is bottle feeding with the hospital Enfamil slow flow nipples with the green collar. Mom has been using for the last 2 weeks. Discussed with mom those nipples are single use and it is not recommended to wash and reuse. Mom reports infant chokes on several nipples including the Avent and Mam she has at home. Discussed trying other slow flow nipples to see if that helps. Discussed using Preemie Dr. Theora GianottiBrown's if  needed.    Infant was latched to the left breast. She took a few seconds to organize and then nursed well. She became frustrated after about 10 minutes. Infant was then latched to the right breast and was more unsettled on the breast. She nursed for a few minutes and then became upset. Dad tried several times to offer the bottle and infant refused, LC was able to get infant to take the bottle. Discussed offering bottle first if infant frustrated at the breast. Enc mom to try BF with each feeding and to not stress infant out at the breast and to stop the BF if infant becomes frustrated. Worked on Hospital doctorcross cradle and football hold latches with mom.   Mom concerned that her left breast produces less than the right. Discussed this is normal in most mothers. Mom is pumping 5 x a day, enc mom to increase pumping to 7-8 x a day to protect milk until infant nursing better.   Mom using Haakaa with feeds. Discussed with mom that Haakaa most likely does not empty the breast fully and that DEBP is still recommended, she reports she is using both. Mom used Haakaa in the office as that is what she had with her.   Infant to follow up with Dr. Azucena Kubaeid on Jan. 10. [facility], she has not called back to schedule with them. Enc  mom to take advantage of their services.  Mom informed of BF Support Groups and handouts given. Mom to follow up with Lactation as needed per her request. Enc mom to follow up with Lactation prn and if infant has tongue and/or lip releases.   Mom and dad reports all their questions have been answered at this time. Parents to call with questions/concerns as needed.  [name] was awake and alert then entire visit. Parents both very attentive to her.      General Information: Mother's reason for visit: Feeding assessment, assistance with latch Consult: Initial Lactation consultant: Noralee StainSharon Aashi Derrington RN,IBCLC Breastfeeding experience: not latching well, pumping and bottle feeding mostly    Maternal medications: Pre-natal vitamin  Breastfeeding History: Frequency of breast feeding: tries several times a day Duration of feeding: few minutes  Supplementation: Supplement method: bottle(Enfamil slow flow nipple (green collar)) Brand: Daron OfferGerber Goodstart Formula volume: 2-3 ounces x 1 Formula frequency: 1 x yesterday   Breast milk volume: 2-2.5 ounces Breast milk frequency: every 2-3 hours   Pump type: Symphony Pump frequency: 5 x a day Pump volume: 5 ounces (1 ounce from the left breast) used Haakaa several times a day with feedings  Infant Output Assessment: Voids per 24 hours: 8+ Urine color: Clear yellow Stools per 24 hours: 8 Stool color: Yellow  Breast Assessment: Breast: Soft, Compressible Nipple: Erect Pain level: 5(5 on right nipple, 0 on left nipple) Pain interventions: Bra, Lanolin, Breast pump  Feeding Assessment: Infant oral assessment: Variance Infant oral assessment comment: Infant with thick labial frenulum that wraps around upper gum ridge. Upper lip with some resistance to flanging. Infant with posterior lingual frenulum noted. Infant with good tongue lateralization and extension. Infant with strong suckle and  good tongue supping and extension on gloved finger and on the bottle. Infant noted to click on the breast and bottle intermittently. Parents shown paced bottle feeding. Infant fed by LC. Infant drooling milk with feeding with the Enfamil Slow flow nipple. Infant noted to have jaw quivering and fatigue with feeding. Infant latched to the breast well although would pull on and off the right breast. infant with disorganized suckle and tongue thrusting when latching to the bottle and to the breast. Nipples are rounded post feeding. Parents shown tongue and lip restrictions. Parents given information on how tongue and lip restrictions can effect milk supply and weight. Parents given handout with website information and local providers who accept Medicaid.  Parents to research and decide if they want infant seen. Discussed with mom continuing to pump to protect milk supply even after she is nursing better.  Positioning: Cross cradle(left breast) Latch: 2 - Grasps breast easily, tongue down, lips flanged, rhythmical sucking. Audible swallowing: 1 - A few with stimulation Type of nipple: 2 - Everted at rest and after stimulation Comfort: 2 - Soft/non-tender Hold: 1 - Assistance needed to correctly position infant at breast and maintain latch LATCH score: 8 Latch assessment: Deep Lips flanged: Yes Suck assessment: Displays both   Pre-feed weight: 4274 grams Post feed weight: 4300 grams Amount transferred: 26 ml Amount supplemented: 0  Additional Feeding Assessment: Infant oral assessment: Variance Infant oral assessment comment: see above Positioning: Football(right breast) Latch: 1 - Repeated attempts neede to sustain latch, nipple held in mouth throughout feeding, stimulation needed to elicit sucking reflex. Audible swallowing: 1 - A few with stimulation Type of nipple: 2 - Everted at rest and after stimulation Comfort: 1 - Filling, red/small blisters or bruises, mild/mod discomfort Hold: 1 -  Assistance needed to correctly position infant at breast and maintain latch LATCH score: 6 Latch assessment: Deep Lips flanged: Yes Suck assessment: Displays both   Pre-feed weight: 4300 grams Post feed weight: 4310 grams Amount transferred: 10 ml Amount supplemented: 55 ml EBM via bottle  Totals: Total amount transferred: 36 ml Total supplement given: 55 ml Total amount pumped post feed: 40 ml with Haakaa   Plan: 1. Offer infant the breast with feeding cues 2. Empty the first breast before offering the second breast, Offer both breasts with each feeding 3. Offer infant about 1/2 ounce in a bottle of pumped breast milk or formula if she is upset at the breast 4. Offer infant a bottle of pumped breast milk after breast feeding if she is  still cueing to feed. Use formula if you are out of breast milk  5. Feed infant using a slower flow nipple suck as Dr. Theora Gianotti Level 1 nipple, Avent Natural Flow level 0 nipple, or Nuk Natural Flow nipple. If she is still choking then try the Dr. Theora Gianotti Preemie Nipple 6. Feed infant using Paced bottle feeding method (video on [email or web address]) 7. Infant needs about 79-105 ml (2.5-3.5 ounces) for 8 feeds a day or 630-840 ml (21-28 ounces) in 24 hours. Infant may take more or less depending on how often she feeds. Feed infant until she is satisfied.  8. Continue to pump with your double electric breast pump 7-8 x a day for about 15 minutes to empty the breasts. Pump each time the infant is getting a bottle to protect milk supply.  9. Keep up the good work 10. Thank you for allowing me to assist your today 11. Please call with any questions/concerns as needed [phone] 12. Follow up with Lactation as needed   Ed Blalock RN, IBCLC                                                    [name] 10/05/2018, 9:27 AM

## 2018-10-19 ENCOUNTER — Ambulatory Visit: Payer: Medicaid Other | Admitting: Advanced Practice Midwife

## 2018-10-23 ENCOUNTER — Ambulatory Visit (INDEPENDENT_AMBULATORY_CARE_PROVIDER_SITE_OTHER): Payer: Medicaid Other | Admitting: Internal Medicine

## 2018-10-23 NOTE — Progress Notes (Signed)
Subjective:     [name] is a 28 y.o. female who presents for a postpartum visit. She is 5 weeks postpartum following a spontaneous vaginal delivery. I have fully reviewed the prenatal and intrapartum course. The delivery was at 39 gestational weeks. Outcome: spontaneous vaginal delivery. Anesthesia: epidural. Postpartum course has been benign . Baby's course has been benign. Baby is feeding by breast. Bleeding thin lochia. Bowel function is abnormal: constipation. Bladder function is normal. Patient is not sexually active. Contraception method is none. Postpartum depression screening: negative.  The following portions of the patient's history were reviewed and updated as appropriate: allergies, current medications, past family history, past medical history, past social history, past surgical history and problem list.  Review of Systems Pertinent items are noted in HPI.   Objective:    BP 116/74   Pulse (!) 57   Wt 196 lb 6.4 oz (89.1 kg)   BMI 34.79 kg/m   General:  alert and cooperative   Breasts:  inspection negative, no nipple discharge or bleeding, no masses or nodularity palpable  Lungs: clear to auscultation bilaterally  Heart:  regular rate and rhythm, S1, S2 normal, no murmur, click, rub or gallop  Abdomen: soft, non-tender; bowel sounds normal; no masses,  no organomegaly   Vulva:  normal  Vagina: normal vagina, no sutures visualized and second degree laceration has healed well   Rectal Exam: Normal rectovaginal exam        Assessment:     Benign postpartum exam. Pap smear not done at today's visit. Normal in Dec 2017.   Plan:    1. Contraception: patient declines contraception at this visit. Plans to use OCPs in the future when she "is ready".  2. Follow up in: Dec 2020 for annual exam and PAP  smear or as needed.    Marcy Siren, D.O. [facility] Fellow, [facility] for [facility] 10/23/2018, 4:58  PM

## 2018-10-23 NOTE — Patient Instructions (Signed)
Your stiches have dissolved and your laceration has healed very well. Continue to drink lots of water to help with constipation. You can also try Miralax if you still find you are constipated.

## 2018-12-04 IMAGING — DX DG SHOULDER 2+V*L*
3 series · 3 of 3 positions shown · non-contrast
Comparison: None.

CLINICAL DATA: Flag football injury to the left shoulder with pain
and swelling.

EXAM:
LEFT SHOULDER - 2+ VIEW

[shoulder ap]
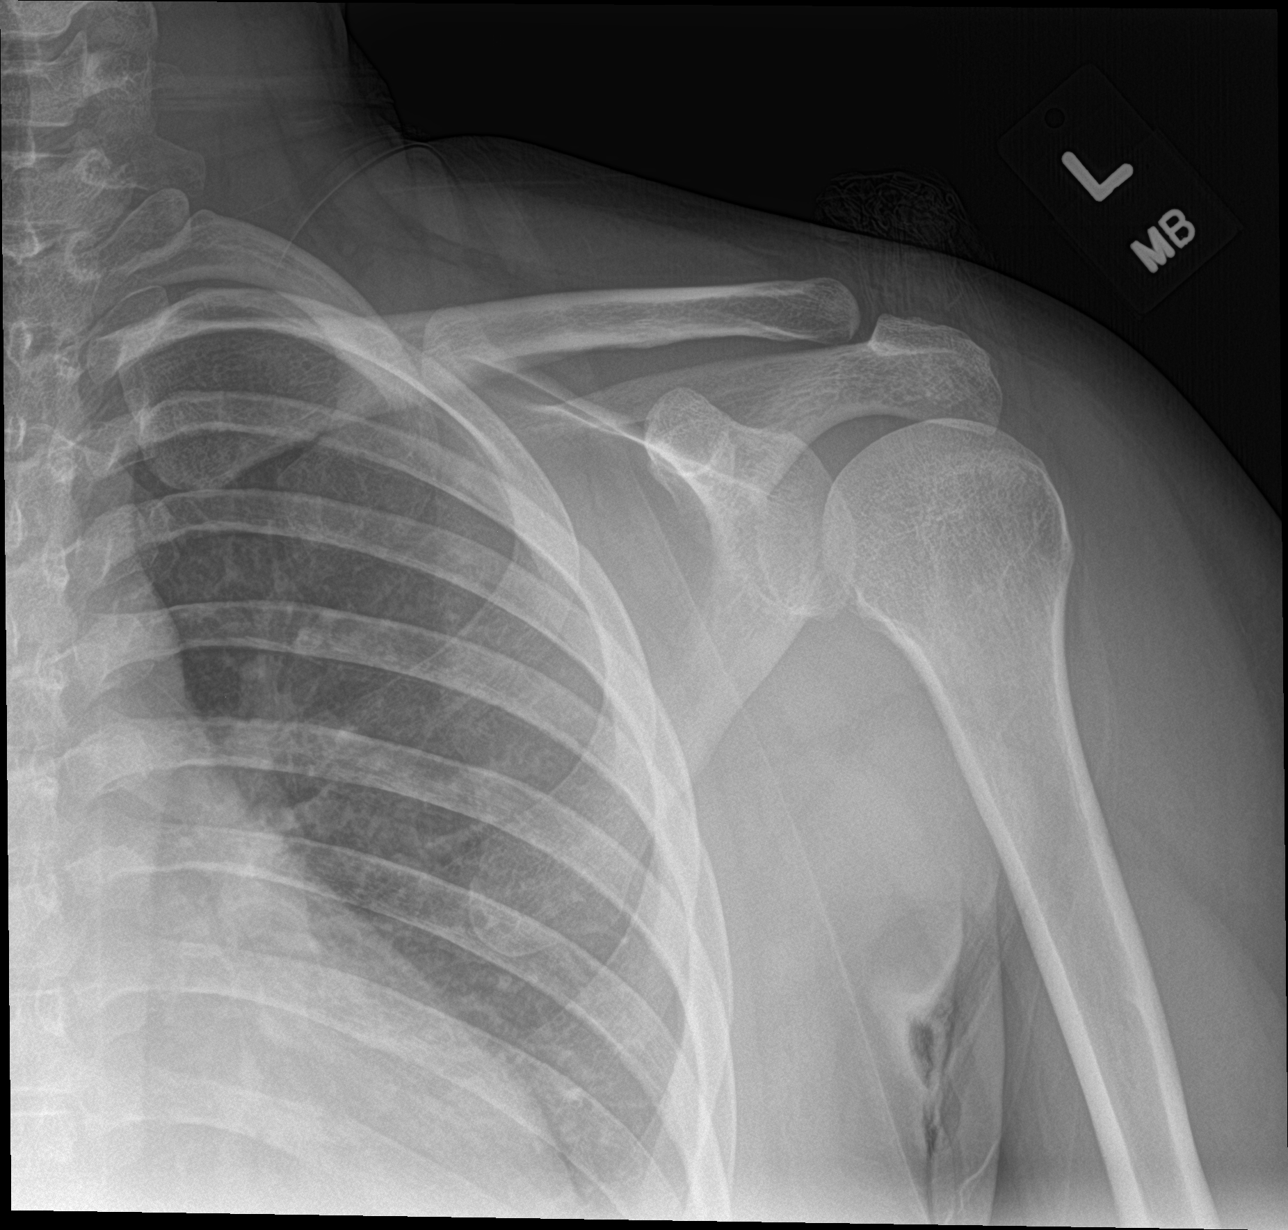

[shoulder grashey]
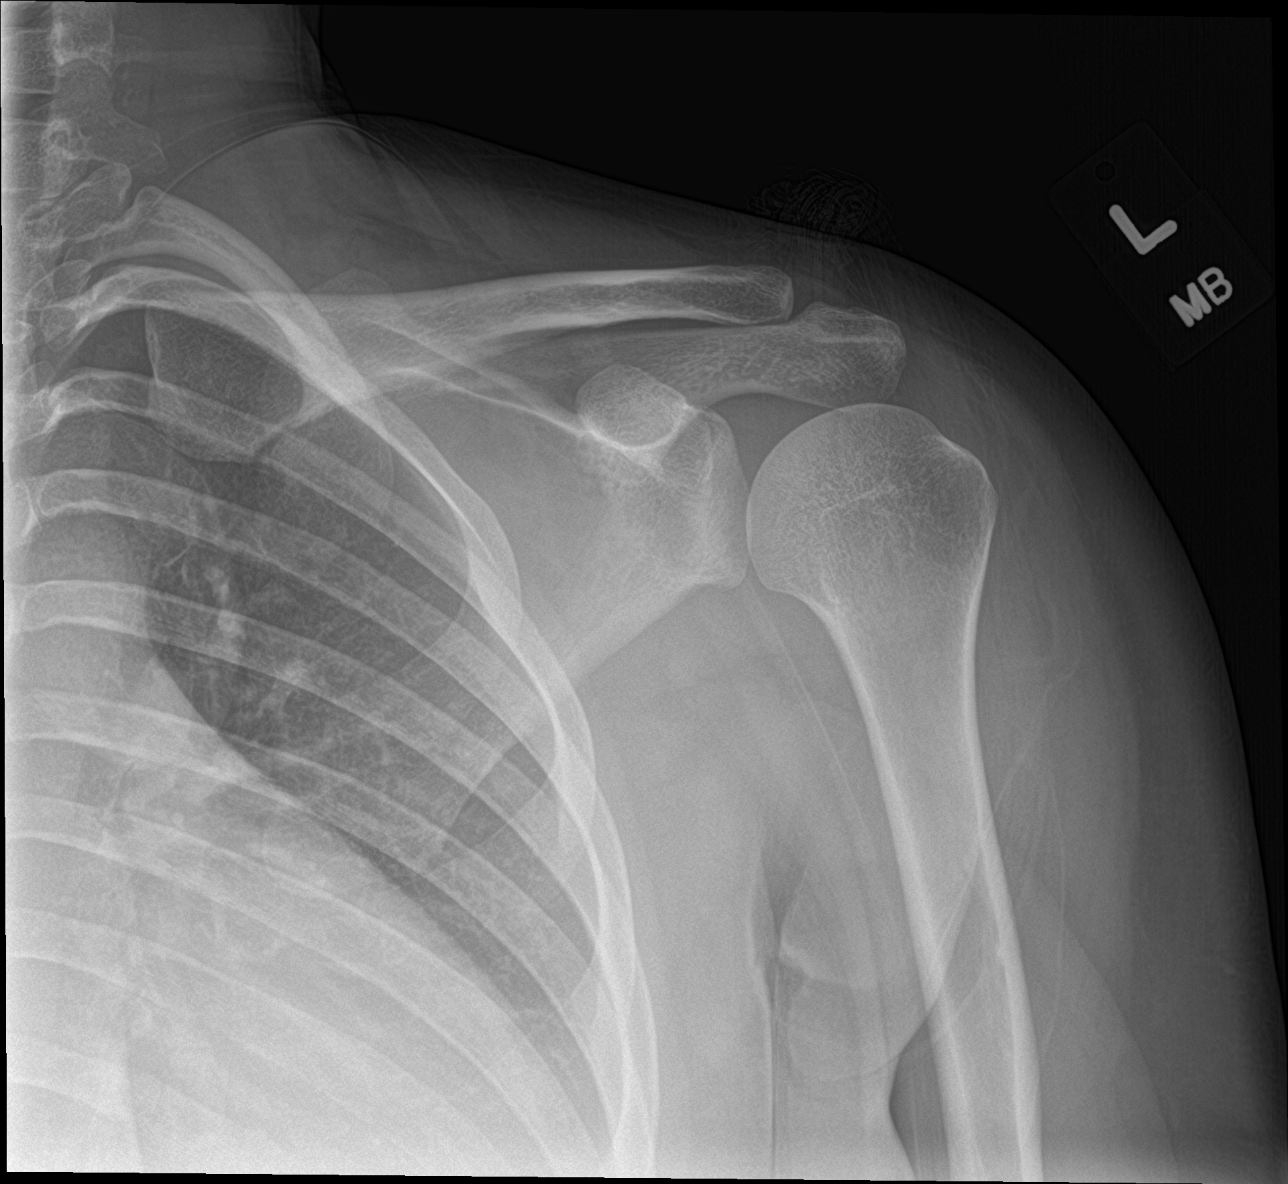

[shoulder y-view]
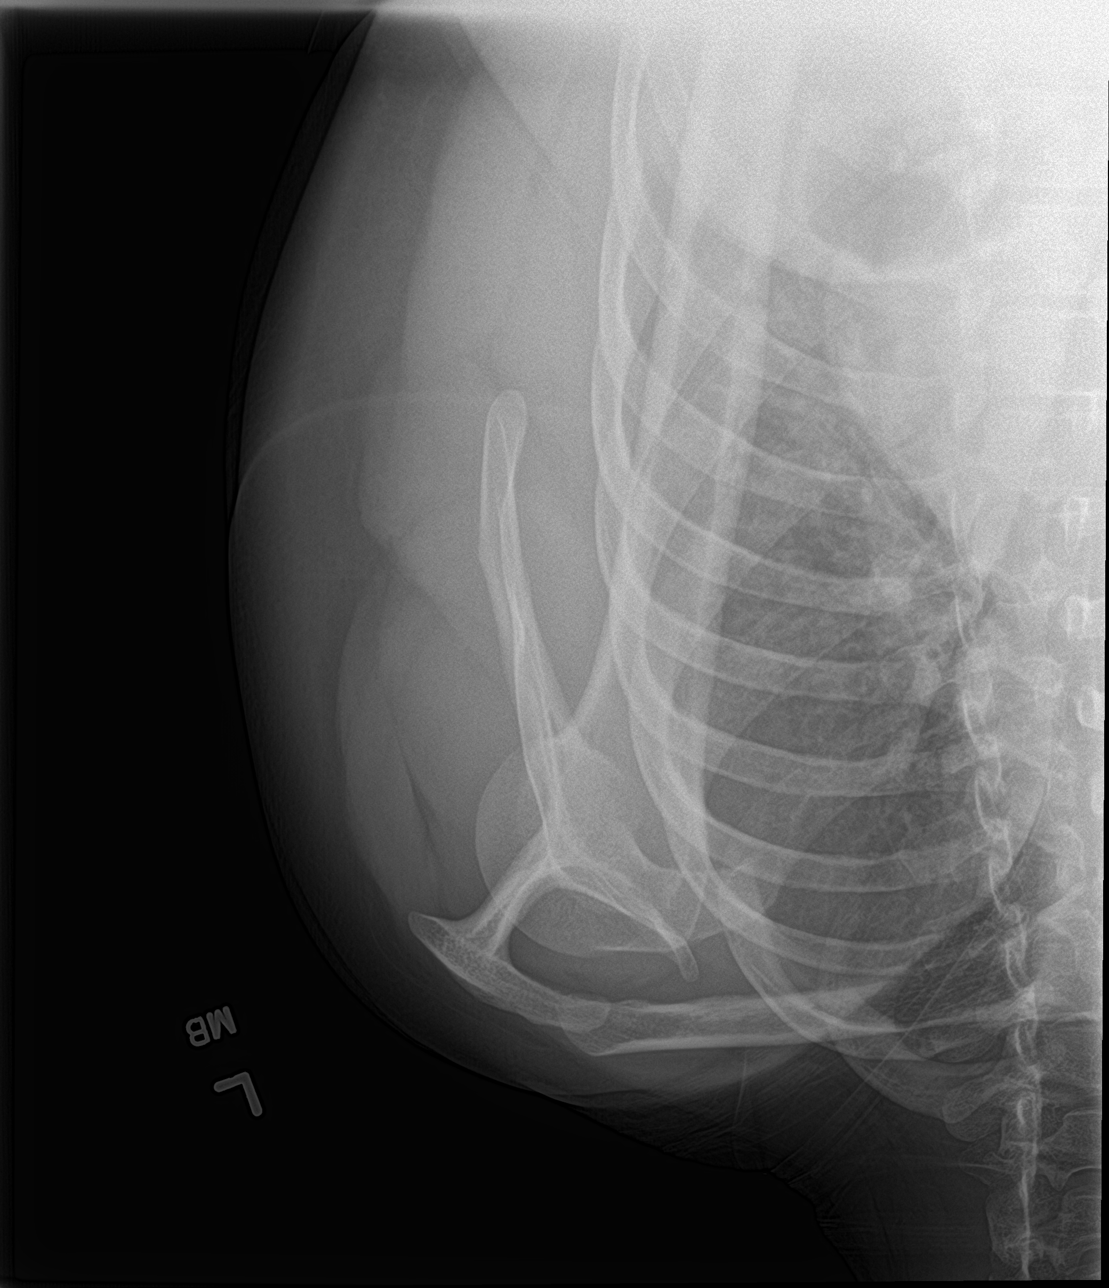

[3 of 3 positions shown; findings below may reference images not displayed]

FINDINGS: There is no evidence of fracture or dislocation. There is no
evidence of arthropathy or other focal bone abnormality. Soft
tissues are unremarkable.
IMPRESSION: Negative.

## 2019-03-13 ENCOUNTER — Telehealth: Payer: Medicaid Other | Admitting: Nurse Practitioner

## 2019-03-13 DIAGNOSIS — M5442 Lumbago with sciatica, left side: Secondary | ICD-10-CM | POA: Diagnosis not present

## 2019-03-13 MED ORDER — CYCLOBENZAPRINE HCL 10 MG PO TABS: 10.0000 mg | ORAL_TABLET | Freq: Three times a day (TID) | ORAL | 0 refills | Status: DC | PRN

## 2019-03-13 NOTE — Progress Notes (Signed)

## 2019-06-05 ENCOUNTER — Other Ambulatory Visit: Payer: Self-pay

## 2019-06-05 ENCOUNTER — Encounter (HOSPITAL_COMMUNITY): Payer: Self-pay

## 2019-06-05 ENCOUNTER — Ambulatory Visit (HOSPITAL_COMMUNITY)
Admission: EM | Admit: 2019-06-05 | Discharge: 2019-06-05 | Disposition: A | Discharge status: Home / Self Care | Payer: Medicaid Other | Attending: Internal Medicine | Admitting: Internal Medicine

## 2019-06-05 DIAGNOSIS — S86899A Other injury of other muscle(s) and tendon(s) at lower leg level, unspecified leg, initial encounter: Secondary | ICD-10-CM

## 2019-06-05 NOTE — ED Provider Notes (Signed)
[facility]    CSN: [id number] Arrival date & time: 06/05/19  1743      History   Chief Complaint Chief Complaint  Patient presents with  . Leg Pain    HPI [name] is a 28 y.o. female with history of anxiety comes to urgent care with complaints of bilateral leg pain which started after she played flag football 2 hours over the past weekend.  Patient just started playing football this past weekend.  She denies any injury during the event.  No numbness or tingling.  The pain is improved in both legs.  She has some pain in the knees bilaterally.  Pain is sharp of mild to moderate intensity.  She is able to bear weight.  She has been using Tylenol with some relief.  No radiation of pain.  She denies any numbness or tingling in the lower extremities.   HPI  Past Medical History:  Diagnosis Date  . Anemia   . Anxiety   . Asthma    exercise induced  . Complication of anesthesia   . Head injury, closed    scalp laceration, dizzy for a few minutes  . History of chlamydia   . PONV (postoperative nausea and vomiting)   . Von Willebrand disease [facility](HCC)     Patient Active Problem List   Diagnosis Date Noted  . Postpartum care following vaginal delivery 09/17/2018  . Labor and delivery, indication for care 09/15/2018  . NSVD (normal spontaneous vaginal delivery) 09/15/2018  . Influenza B 09/14/2018  . Braxton Hick's contraction 09/14/2018  . Uterine size date discrepancy pregnancy, third trimester 09/13/2018  . Dizziness 09/03/2018  . Suspected condition not found 08/28/2018  . Supervision of other normal pregnancy, antepartum 03/12/2018  . Von Willebrand disease (HCC) 12/31/2016  . History of multiple miscarriages 12/31/2016    Past Surgical History:  Procedure Laterality Date  . HAND SURGERY Left 2009   thumb , fracture  . HAND SURGERY Left 2006   Pins placed  . HARDWARE REMOVAL Left 02/10/2016   Procedure: LEFT SMALL FINGER DEEP IMPLANT REMOVAL;  Surgeon:  Bradly BienenstockFred Ortmann, MD;  Location: [facility] OR;  Service: Orthopedics;  Laterality: Left;  . OPEN REDUCTION INTERNAL FIXATION (ORIF) PROXIMAL PHALANX Left 01/06/2016   Procedure: OPEN REDUCTION INTERNAL FIXATION (ORIF) LEFT SMALL FINGER;  Surgeon: Bradly BienenstockFred Ortmann, MD;  Location: [facility] OR;  Service: Orthopedics;  Laterality: Left;    OB History    Gravida  3   Para      Term      Preterm      AB  2   Living  0     SAB  2   TAB      Ectopic      Multiple      Live Births               Home Medications    Prior to Admission medications   Medication Sig Start Date End Date Taking? Authorizing Provider  acetaminophen (TYLENOL) 325 MG tablet Take 650 mg by mouth every 6 (six) hours as needed for mild pain or headache.     [provider]  albuterol (PROVENTIL HFA;VENTOLIN HFA) 108 (90 Base) MCG/ACT inhaler Inhale 1-2 puffs into the lungs every 4 (four) hours as needed for wheezing or shortness of breath. 09/13/18   Leftwich-Kirby, Wilmer FloorLisa A, CNM  EPINEPHrine 0.3 mg/0.3 mL IJ SOAJ injection Inject 0.3 mLs (0.3 mg total) into the muscle once as needed. As needed  for bee stings 04/24/17   Lorin Glass, PA-C  FERROCITE 324 MG TABS tablet Take 1 tablet by mouth daily. 08/13/18   [provider]  Prenatal Vit-Fe Fumarate-FA (PRENATAL MULTIVITAMIN) TABS tablet Take 1 tablet by mouth at bedtime.    [provider]    Family History Family History  Problem Relation Age of Onset  . Diabetes Father   . Hypertension Father   . Hypercholesterolemia Father   . Heart disease Father   . Cancer Paternal Grandfather   . Cancer Paternal Grandmother     Social History Social History   Tobacco Use  . Smoking status: Never Smoker  . Smokeless tobacco: Never Used  Substance Use Topics  . Alcohol use: Not Currently    Comment: Currently no drinking due to pregnancy  . Drug use: No     Allergies   Bee venom and Adhesive [tape]   Review of Systems Review of  Systems  Constitutional: Negative.   HENT: Negative.   Respiratory: Negative.   Cardiovascular: Negative.   Gastrointestinal: Negative.   Genitourinary: Negative.   Musculoskeletal: Positive for arthralgias. Negative for back pain, gait problem, joint swelling and myalgias.  Skin: Negative.   Neurological: Negative for dizziness, weakness, light-headedness, numbness and headaches.  Psychiatric/Behavioral: Negative.      Physical Exam Triage Vital Signs ED Triage Vitals  Enc Vitals Group     BP 06/05/19 1806 110/73     Pulse Rate 06/05/19 1806 83     Resp 06/05/19 1806 16     Temp 06/05/19 1806 98.3 F (36.8 C)     Temp Source 06/05/19 1806 Oral     SpO2 06/05/19 1806 100 %     Weight --      Height --      Head Circumference --      Peak Flow --      Pain Score 06/05/19 1807 6     Pain Loc --      Pain Edu? --      Excl. in [place]? --    No data found.  Updated Vital Signs BP 110/73 (BP Location: Right Arm)   Pulse 83   Temp 98.3 F (36.8 C) (Oral)   Resp 16   LMP 05/29/2019   SpO2 100%   Visual Acuity Right Eye Distance:   Left Eye Distance:   Bilateral Distance:    Right Eye Near:   Left Eye Near:    Bilateral Near:     Physical Exam Constitutional:      General: She is not in acute distress.    Appearance: She is not ill-appearing or toxic-appearing.  Cardiovascular:     Rate and Rhythm: Normal rate and regular rhythm.     Pulses: Normal pulses.     Heart sounds: Normal heart sounds.  Pulmonary:     Effort: Pulmonary effort is normal. No respiratory distress.     Breath sounds: Normal breath sounds. No stridor. No wheezing or rhonchi.  Abdominal:     General: Bowel sounds are normal.     Palpations: Abdomen is soft.  Musculoskeletal:        General: No swelling, tenderness, deformity or signs of injury.  Skin:    General: Skin is warm.     Capillary Refill: Capillary refill takes less than 2 seconds.  Neurological:     General: No focal deficit  present.     Mental Status: She is alert and oriented to person, place, and  time.      UC Treatments / Results  Labs (all labs ordered are listed, but only abnormal results are displayed) Labs Reviewed - No data to display  EKG   Radiology No results found.  Procedures Procedures (including critical care time)  Medications Ordered in UC Medications - No data to display  Initial Impression / Assessment and Plan / UC Course  I have reviewed the triage vital signs and the nursing notes.  Pertinent labs & imaging results that were available during my care of the patient were reviewed by me and considered in my medical decision making (see chart for details).     1.  Anterior shin splints: Ice both legs after intense exercise Tylenol as needed for pain Stretching before exercises and/or events Take rest between exercises Build endurance in the graded fashion If pain worsens, patient can return to urgent care to be reevaluated Final Clinical Impressions(s) / UC Diagnoses   Final diagnoses:  Anterior shin splints   Discharge Instructions   None    ED Prescriptions    None     Controlled Substance Prescriptions [place] Controlled Substance Registry consulted? No   Merrilee Jansky, MD 06/05/19 [phone]

## 2019-06-05 NOTE — ED Triage Notes (Signed)
Pt presents to UC with bilateral leg pain x4 days. Ibuprofen does provide some relief. Pt reports pain started on front of legs and has progressed to entire leg. Pt reports it is difficult to extend legs and walk without feeling pain.

## 2019-06-10 ENCOUNTER — Other Ambulatory Visit: Payer: Self-pay

## 2019-06-10 ENCOUNTER — Encounter (HOSPITAL_COMMUNITY): Payer: Self-pay

## 2019-06-10 ENCOUNTER — Ambulatory Visit (HOSPITAL_COMMUNITY)
Admission: EM | Admit: 2019-06-10 | Discharge: 2019-06-10 | Disposition: A | Discharge status: Home / Self Care | Payer: Medicaid Other | Attending: Family Medicine | Admitting: Family Medicine

## 2019-06-10 DIAGNOSIS — M25562 Pain in left knee: Secondary | ICD-10-CM

## 2019-06-10 DIAGNOSIS — M25561 Pain in right knee: Secondary | ICD-10-CM

## 2019-06-10 NOTE — Discharge Instructions (Signed)
Take 2 Aleve before you exercise Ice your knees for 20 minutes after each exercise Do your stretches every day or twice a day Work on quadricep strengthening exercises You must to slow down on your exercise and gradually build back up to your prior level of performance

## 2019-06-10 NOTE — ED Provider Notes (Signed)
[facility]    CSN: [id number] Arrival date & time: 06/10/19  1739      History   Chief Complaint Chief Complaint  Patient presents with  . Knee Pain    HPI [name] is a 28 y.o. female.   HPI  Patient states that for years she played flag football.  She took 9 months off when she was pregnant and had a baby.  Now the baby is getting bigger she is starting to play again.  She states that she has been to a couple of exercise sessions.  She has been doing lunges, squats, sprinting, intervals.  She has not exercised for many months and these have been very difficult for her.  She has had some leg pain.  She was seen here couple weeks ago for bilateral leg pain.  Was diagnosed with shin splints.  Was told to decrease her activity, use ice, use Tylenol.  She states that her legs are slightly better but both of her knees are still painful.  Left greater than right.  She states they pop and crack and crunch and make noise.  She states the symptoms feel like they cannot buckle or give out on her.  She states that she limps even when she is not exercising.  She tried to go to her practice this weekend but was unable to participate in the exercises.  She has never had x-rays of her knees.  She is never had knee injuries.  She is otherwise in good health.  Past Medical History:  Diagnosis Date  . Anemia   . Anxiety   . Asthma    exercise induced  . Complication of anesthesia   . Head injury, closed    scalp laceration, dizzy for a few minutes  . History of chlamydia   . PONV (postoperative nausea and vomiting)   . Von Willebrand disease [facility])     Patient Active Problem List   Diagnosis Date Noted  . Postpartum care following vaginal delivery 09/17/2018  . Labor and delivery, indication for care 09/15/2018  . NSVD (normal spontaneous vaginal delivery) 09/15/2018  . Influenza B 09/14/2018  . Braxton Hick's contraction 09/14/2018  . Uterine size date discrepancy pregnancy,  third trimester 09/13/2018  . Dizziness 09/03/2018  . Suspected condition not found 08/28/2018  . Supervision of other normal pregnancy, antepartum 03/12/2018  . Von Willebrand disease (HCC) 12/31/2016  . History of multiple miscarriages 12/31/2016    Past Surgical History:  Procedure Laterality Date  . HAND SURGERY Left 2009   thumb , fracture  . HAND SURGERY Left 2006   Pins placed  . HARDWARE REMOVAL Left 02/10/2016   Procedure: LEFT SMALL FINGER DEEP IMPLANT REMOVAL;  Surgeon: Bradly Bienenstock, MD;  Location: [facility];  Service: Orthopedics;  Laterality: Left;  . OPEN REDUCTION INTERNAL FIXATION (ORIF) PROXIMAL PHALANX Left 01/06/2016   Procedure: OPEN REDUCTION INTERNAL FIXATION (ORIF) LEFT SMALL FINGER;  Surgeon: Bradly Bienenstock, MD;  Location: [facility];  Service: Orthopedics;  Laterality: Left;    OB History    Gravida  3   Para      Term      Preterm      AB  2   Living  0     SAB  2   TAB      Ectopic      Multiple      Live Births  Home Medications    Prior to Admission medications   Medication Sig Start Date End Date Taking? Authorizing Provider  acetaminophen (TYLENOL) 325 MG tablet Take 650 mg by mouth every 6 (six) hours as needed for mild pain or headache.     [provider]  albuterol (PROVENTIL HFA;VENTOLIN HFA) 108 (90 Base) MCG/ACT inhaler Inhale 1-2 puffs into the lungs every 4 (four) hours as needed for wheezing or shortness of breath. 09/13/18   Leftwich-Kirby, Wilmer FloorLisa A, CNM  EPINEPHrine 0.3 mg/0.3 mL IJ SOAJ injection Inject 0.3 mLs (0.3 mg total) into the muscle once as needed. As needed for bee stings 04/24/17   Cristina GongHammond, Elizabeth W, PA-C  FERROCITE 324 MG TABS tablet Take 1 tablet by mouth daily. 08/13/18   [provider]  Prenatal Vit-Fe Fumarate-FA (PRENATAL MULTIVITAMIN) TABS tablet Take 1 tablet by mouth at bedtime.    [provider]    Family History Family History  Problem Relation Age of Onset  .  Diabetes Father   . Hypertension Father   . Hypercholesterolemia Father   . Heart disease Father   . Cancer Paternal Grandfather   . Cancer Paternal Grandmother     Social History Social History   Tobacco Use  . Smoking status: Never Smoker  . Smokeless tobacco: Never Used  Substance Use Topics  . Alcohol use: Not Currently    Comment: Currently no drinking due to pregnancy  . Drug use: No     Allergies   Bee venom and Adhesive [tape]   Review of Systems Review of Systems  Constitutional: Negative for chills and fever.  HENT: Negative for ear pain and sore throat.   Eyes: Negative for pain and visual disturbance.  Respiratory: Negative for cough and shortness of breath.   Cardiovascular: Negative for chest pain and palpitations.  Gastrointestinal: Negative for abdominal pain and vomiting.  Genitourinary: Negative for dysuria and hematuria.  Musculoskeletal: Positive for arthralgias and gait problem. Negative for back pain.  Skin: Negative for color change and rash.  Neurological: Negative for seizures and syncope.  All other systems reviewed and are negative.    Physical Exam Triage Vital Signs ED Triage Vitals  Enc Vitals Group     BP 06/10/19 1830 131/70     Pulse Rate 06/10/19 1830 76     Resp 06/10/19 1830 18     Temp 06/10/19 1830 98.3 F (36.8 C)     Temp Source 06/10/19 1830 Oral     SpO2 06/10/19 1830 100 %     Weight 06/10/19 1831 212 lb (96.2 kg)     Height --      Head Circumference --      Peak Flow --      Pain Score 06/10/19 1831 6     Pain Loc --      Pain Edu? --      Excl. in GC? --    No data found.  Updated Vital Signs BP 131/70 (BP Location: Right Arm)   Pulse 76   Temp 98.3 F (36.8 C) (Oral)   Resp 18   Wt 96.2 kg   LMP 05/29/2019   SpO2 100%   BMI 37.55 kg/m   Visual Acuity Right Eye Distance:   Left Eye Distance:   Bilateral Distance:    Right Eye Near:   Left Eye Near:    Bilateral Near:     Physical Exam  Constitutional:      General: She is not in acute  distress.    Appearance: She is well-developed. She is obese.  HENT:     Head: Normocephalic and atraumatic.  Eyes:     Conjunctiva/sclera: Conjunctivae normal.     Pupils: Pupils are equal, round, and reactive to light.  Neck:     Musculoskeletal: Normal range of motion.  Cardiovascular:     Rate and Rhythm: Normal rate and regular rhythm.     Heart sounds: Normal heart sounds.  Pulmonary:     Effort: Pulmonary effort is normal. No respiratory distress.     Breath sounds: Normal breath sounds.  Abdominal:     General: There is no distension.     Palpations: Abdomen is soft.  Musculoskeletal: Normal range of motion.     Comments: Anatomically, the patient does have a increased Q angle from her anterior superior iliac spine to the tibial tuberosity to the ankle.  She has bilateral patellofemoral crepitus left greater than right.  She has significant pain with patellofemoral grind testing.  She has moderately subluxable patella.  Other than this her knees have good range of motion.  Normal ligamentous stability.  No joint line tenderness or effusion.  Skin:    General: Skin is warm and dry.  Neurological:     Mental Status: She is alert.  Psychiatric:        Mood and Affect: Mood normal.        Behavior: Behavior normal.      UC Treatments / Results  Labs (all labs ordered are listed, but only abnormal results are displayed) Labs Reviewed - No data to display  EKG   Radiology No results found.  Procedures Procedures (including critical care time)  Medications Ordered in UC Medications - No data to display  Initial Impression / Assessment and Plan / UC Course  I have reviewed the triage vital signs and the nursing notes.  Pertinent labs & imaging results that were available during my care of the patient were reviewed by me and considered in my medical decision making (see chart for details).     I spent some time  discussing with the patient that she has anterior knee pain.  Patellofemoral pain syndrome.  I gave her a anterior knee pain exercise handout.  Also a handout associated with the chart.  I talked her about ice.  Aleve.  Gradual increase in activity.  I demonstrated to her exercises that need to be done on a daily basis.  I discussed with her the possibility of braces to support her patella while she exercises, although I discourage her depending on braces and think strengthening would be a better plan.  If she fails to improve, believe a referral to a sports medicine physician would be appropriate Final Clinical Impressions(s) / UC Diagnoses   Final diagnoses:  Bilateral anterior knee pain     Discharge Instructions     Take 2 Aleve before you exercise Ice your knees for 20 minutes after each exercise Do your stretches every day or twice a day Work on quadricep strengthening exercises You must to slow down on your exercise and gradually build back up to your prior level of performance   ED Prescriptions    None     Controlled Substance Prescriptions [place] Controlled Substance Registry consulted? Not Applicable   Raylene Everts, MD 06/10/19 Joen Laura

## 2019-06-10 NOTE — ED Triage Notes (Signed)
Pt went to practice over the weekend ( flag football ). Pt states she has pain in her left knee.

## 2019-07-31 ENCOUNTER — Ambulatory Visit (HOSPITAL_COMMUNITY)
Admission: EM | Admit: 2019-07-31 | Discharge: 2019-07-31 | Disposition: A | Discharge status: Home / Self Care | Payer: Medicaid Other | Attending: Family Medicine | Admitting: Family Medicine

## 2019-07-31 ENCOUNTER — Other Ambulatory Visit: Payer: Self-pay

## 2019-07-31 ENCOUNTER — Encounter (HOSPITAL_COMMUNITY): Payer: Self-pay

## 2019-07-31 DIAGNOSIS — L219 Seborrheic dermatitis, unspecified: Secondary | ICD-10-CM

## 2019-07-31 MED ORDER — TRIAMCINOLONE ACETONIDE 0.1 % EX CREA: 1.0000 "application " | TOPICAL_CREAM | Freq: Two times a day (BID) | CUTANEOUS | 0 refills | Status: DC

## 2019-07-31 NOTE — ED Triage Notes (Signed)
Pt presents with a recurring scalp issue with a sore that comes and goes on her head.

## 2019-08-03 NOTE — ED Provider Notes (Signed)
[facility]   [id number] 07/31/19 Arrival Time: 1547  ASSESSMENT & PLAN:  1. Seborrheic dermatitis of scalp     No signs of skin infection. With a small area will begin with: Meds ordered this encounter  Medications  . triamcinolone cream (KENALOG) 0.1 %    Sig: Apply 1 application topically 2 (two) times daily. For up to one week.    Dispense:  15 g    Refill:  0    Will f/u here2 if not seeing improvement over the next 1-2 weeks. May also try OTC Selsun Blue shampoo.  Reviewed expectations re: course of current medical issues. Questions answered. Outlined signs and symptoms indicating need for more acute intervention. Patient verbalized understanding. After Visit Summary given.   SUBJECTIVE:  [name] is a 28 y.o. female who presents with a skin complaint.   Location: superior scalp Onset: gradual Duration: "not sure"; thinks a couple of weeks Associated pruritis? moderate Associated pain? none Progression: stable  Drainage? none Known trigger? No  New soaps/lotions/topicals/detergents? No  Environmental exposures? No  Contacts with similar? No  Recent travel? No  Other associated symptoms: none Therapies tried thus far: none Arthralgia or myalgia? none Recent illness? none Fever? none New medications? none No specific aggravating or alleviating factors reported. No hair loss reported.  ROS: As per HPI. All other systems negative.   OBJECTIVE: Vitals:   07/31/19 1613  BP: 117/67  Pulse: 75  Resp: 16  Temp: 98.1 F (36.7 C)  TempSrc: Oral  SpO2: 95%    General appearance: alert; no distress HEENT: [place]; AT Neck: supple with FROM Lungs: clear to auscultation bilaterally Heart: regular rate and rhythm Extremities: no edema; moves all extremities normally Skin: warm and dry; signs of infection: no; superior scalp with 1-2 inch area of dry scaly skin; no fluctuance; no bleeding or drainage; non-tender to touch Psychological: alert  and cooperative; normal mood and affect  Allergies  Allergen Reactions  . Bee Venom Anaphylaxis  . Adhesive [Tape] Itching and Rash    Past Medical History:  Diagnosis Date  . Anemia   . Anxiety   . Asthma    exercise induced  . Complication of anesthesia   . Head injury, closed    scalp laceration, dizzy for a few minutes  . History of chlamydia   . PONV (postoperative nausea and vomiting)   . Von Willebrand disease (HCC)    Social History   Socioeconomic History  . Marital status: Single    Spouse name: Not on file  . Number of children: Not on file  . Years of education: Not on file  . Highest education level: Not on file  Occupational History  . Not on file  Social Needs  . Financial resource strain: Not on file  . Food insecurity    Worry: Not on file    Inability: Not on file  . Transportation needs    Medical: Not on file    Non-medical: Not on file  Tobacco Use  . Smoking status: Never Smoker  . Smokeless tobacco: Never Used  Substance and Sexual Activity  . Alcohol use: Not Currently    Comment: Currently no drinking due to pregnancy  . Drug use: No  . Sexual activity: Not Currently    Birth control/protection: None  Lifestyle  . Physical activity    Days per week: Not on file    Minutes per session: Not on file  . Stress: Not on file  Relationships  .  Social Herbalist on phone: Not on file    Gets together: Not on file    Attends religious service: Not on file    Active member of club or organization: Not on file    Attends meetings of clubs or organizations: Not on file    Relationship status: Not on file  . Intimate partner violence    Fear of current or ex partner: Not on file    Emotionally abused: Not on file    Physically abused: Not on file    Forced sexual activity: Not on file  Other Topics Concern  . Not on file  Social History Narrative  . Not on file   Family History  Problem Relation Age of Onset  . Diabetes  Father   . Hypertension Father   . Hypercholesterolemia Father   . Heart disease Father   . Cancer Paternal Grandfather   . Cancer Paternal Grandmother    Past Surgical History:  Procedure Laterality Date  . HAND SURGERY Left 2009   thumb , fracture  . HAND SURGERY Left 2006   Pins placed  . HARDWARE REMOVAL Left 02/10/2016   Procedure: LEFT SMALL FINGER DEEP IMPLANT REMOVAL;  Surgeon: Iran Planas, MD;  Location: [place];  Service: Orthopedics;  Laterality: Left;  . OPEN REDUCTION INTERNAL FIXATION (ORIF) PROXIMAL PHALANX Left 01/06/2016   Procedure: OPEN REDUCTION INTERNAL FIXATION (ORIF) LEFT SMALL FINGER;  Surgeon: Iran Planas, MD;  Location: [place];  Service: Orthopedics;  Laterality: Left;     Vanessa Kick, MD 08/03/19 [phone]

## 2019-09-18 ENCOUNTER — Other Ambulatory Visit: Payer: Self-pay

## 2019-09-18 DIAGNOSIS — Z20822 Contact with and (suspected) exposure to covid-19: Secondary | ICD-10-CM

## 2019-09-19 LAB — NOVEL CORONAVIRUS, NAA: SARS-CoV-2, NAA: NOT DETECTED

## 2019-09-20 ENCOUNTER — Emergency Department (HOSPITAL_BASED_OUTPATIENT_CLINIC_OR_DEPARTMENT_OTHER): Payer: Medicaid Other

## 2019-09-20 ENCOUNTER — Emergency Department (HOSPITAL_COMMUNITY)
Admission: EM | Admit: 2019-09-20 | Discharge: 2019-09-20 | Disposition: A | Discharge status: Home / Self Care | Payer: Medicaid Other | Attending: Emergency Medicine | Admitting: Emergency Medicine

## 2019-09-20 ENCOUNTER — Encounter (HOSPITAL_COMMUNITY): Payer: Self-pay

## 2019-09-20 ENCOUNTER — Other Ambulatory Visit: Payer: Self-pay

## 2019-09-20 DIAGNOSIS — M79605 Pain in left leg: Secondary | ICD-10-CM | POA: Insufficient documentation

## 2019-09-20 DIAGNOSIS — M7989 Other specified soft tissue disorders: Secondary | ICD-10-CM | POA: Diagnosis not present

## 2019-09-20 DIAGNOSIS — Z79899 Other long term (current) drug therapy: Secondary | ICD-10-CM | POA: Insufficient documentation

## 2019-09-20 DIAGNOSIS — J45909 Unspecified asthma, uncomplicated: Secondary | ICD-10-CM | POA: Diagnosis not present

## 2019-09-20 MED ORDER — ACETAMINOPHEN 500 MG PO TABS: 1000.0000 mg | ORAL_TABLET | Freq: Once | ORAL | Status: DC

## 2019-09-20 MED ORDER — DICLOFENAC SODIUM 1 % EX GEL: 4.0000 g | Freq: Once | CUTANEOUS | Status: DC

## 2019-09-20 NOTE — Discharge Instructions (Addendum)
Your ultrasound today did not show any evidence of blood clot, our technician did note some slow blood flow in this area which can put you at risk for future blood clots, please begin taking your a baby aspirin (81 mg) daily with food, get up and walk and move around frequently throughout the day, elevate your legs if you note any swelling, you can also wear compression stockings, if you continue having pain in the back of your leg over the next 1 to 2 weeks please follow-up with your PCP for reevaluation.

## 2019-09-20 NOTE — ED Provider Notes (Signed)
[facility]-EMERGENCY DEPT Provider Note   CSN: [id number] Arrival date & time: 09/20/19  1725     History Chief Complaint  Patient presents with  . Leg Pain    [name] is a 28 y.o. female.  [name] is a 28 y.o. female with a history of von Willebrand's disease, asthma, and anxiety, who presents to the emergency department from urgent care for evaluation of left leg pain.  Patient reports she was sent for ultrasound to rule out DVT.  States that over the past week she has been having intermittent pains through the posterior left thigh.  She denies any injury or trauma to the area.  She states that today pain worsened and she was having persistent shooting pains behind her knee that radiated down through her calf and up to the back of her thigh.  She has not noted any skin changes or swelling.  She denies history of similar pain before.  No associated chest pain or shortness of breath.  Denies any recent long distance travel or surgeries.  She is not on any estrogen containing contraceptives, no history of PE or DVT.  No other aggravating or alleviating factors.        Past Medical History:  Diagnosis Date  . Anemia   . Anxiety   . Asthma    exercise induced  . Complication of anesthesia   . Head injury, closed    scalp laceration, dizzy for a few minutes  . History of chlamydia   . PONV (postoperative nausea and vomiting)   . Von Willebrand disease [facility])     Patient Active Problem List   Diagnosis Date Noted  . Postpartum care following vaginal delivery 09/17/2018  . Labor and delivery, indication for care 09/15/2018  . NSVD (normal spontaneous vaginal delivery) 09/15/2018  . Influenza B 09/14/2018  . Braxton Hick's contraction 09/14/2018  . Uterine size date discrepancy pregnancy, third trimester 09/13/2018  . Dizziness 09/03/2018  . Suspected condition not found 08/28/2018  . Supervision of other normal pregnancy, antepartum 03/12/2018    . Von Willebrand disease (HCC) 12/31/2016  . History of multiple miscarriages 12/31/2016    Past Surgical History:  Procedure Laterality Date  . HAND SURGERY Left 2009   thumb , fracture  . HAND SURGERY Left 2006   Pins placed  . HARDWARE REMOVAL Left 02/10/2016   Procedure: LEFT SMALL FINGER DEEP IMPLANT REMOVAL;  Surgeon: Bradly Bienenstock, MD;  Location: [facility] OR;  Service: Orthopedics;  Laterality: Left;  . OPEN REDUCTION INTERNAL FIXATION (ORIF) PROXIMAL PHALANX Left 01/06/2016   Procedure: OPEN REDUCTION INTERNAL FIXATION (ORIF) LEFT SMALL FINGER;  Surgeon: Bradly Bienenstock, MD;  Location: [facility] OR;  Service: Orthopedics;  Laterality: Left;     OB History    Gravida  3   Para      Term      Preterm      AB  2   Living  0     SAB  2   TAB      Ectopic      Multiple      Live Births              Family History  Problem Relation Age of Onset  . Diabetes Father   . Hypertension Father   . Hypercholesterolemia Father   . Heart disease Father   . Cancer Paternal Grandfather   . Cancer Paternal Grandmother     Social History   Tobacco Use  .  Smoking status: Never Smoker  . Smokeless tobacco: Never Used  Substance Use Topics  . Alcohol use: Not Currently    Comment: Currently no drinking due to pregnancy  . Drug use: No    Home Medications Prior to Admission medications   Medication Sig Start Date End Date Taking? Authorizing Provider  acetaminophen (TYLENOL) 325 MG tablet Take 650 mg by mouth every 6 (six) hours as needed for mild pain or headache.     [provider]  albuterol (PROVENTIL HFA;VENTOLIN HFA) 108 (90 Base) MCG/ACT inhaler Inhale 1-2 puffs into the lungs every 4 (four) hours as needed for wheezing or shortness of breath. 09/13/18   Leftwich-Kirby, Wilmer Floor, CNM  EPINEPHrine 0.3 mg/0.3 mL IJ SOAJ injection Inject 0.3 mLs (0.3 mg total) into the muscle once as needed. As needed for bee stings 04/24/17   Cristina Gong, PA-C  FERROCITE 324  MG TABS tablet Take 1 tablet by mouth daily. 08/13/18   [provider]  Prenatal Vit-Fe Fumarate-FA (PRENATAL MULTIVITAMIN) TABS tablet Take 1 tablet by mouth at bedtime.    [provider]  triamcinolone cream (KENALOG) 0.1 % Apply 1 application topically 2 (two) times daily. For up to one week. 07/31/19   Mardella Layman, MD    Allergies    Bee venom, Nsaids, and Adhesive [tape]  Review of Systems   Review of Systems  Constitutional: Negative for chills and fever.  Respiratory: Negative for cough and shortness of breath.   Cardiovascular: Negative for chest pain and leg swelling.  Musculoskeletal: Positive for myalgias. Negative for arthralgias and joint swelling.  Skin: Negative for color change and rash.  Neurological: Negative for weakness and numbness.    Physical Exam Updated Vital Signs BP 120/84 (BP Location: Right Arm)   Pulse 97   Temp 99.3 F (37.4 C) (Oral)   Resp 18   SpO2 97%   Physical Exam Vitals and nursing note reviewed.  Constitutional:      General: She is not in acute distress.    Appearance: Normal appearance. She is well-developed and normal weight. She is not ill-appearing or diaphoretic.  HENT:     Head: Normocephalic and atraumatic.  Eyes:     General:        Right eye: No discharge.        Left eye: No discharge.  Pulmonary:     Effort: Pulmonary effort is normal. No respiratory distress.  Musculoskeletal:     Comments: Mild tenderness behind the left knee with palpation but no palpable deformity or swelling, no overlying erythema or evidence of cellulitis.  No calf tenderness or palpable cord.  Distal pulses 2+, normal sensation, 5/5 strength  Skin:    General: Skin is warm and dry.  Neurological:     Mental Status: She is alert and oriented to person, place, and time.     Coordination: Coordination normal.  Psychiatric:        Mood and Affect: Mood normal.        Behavior: Behavior normal.     ED Results / Procedures /  Treatments   Labs (all labs ordered are listed, but only abnormal results are displayed) Labs Reviewed - No data to display  EKG None  Radiology VAS [place] LOWER EXTREMITY VENOUS (DVT) ([facility] 7a-7p)  Result Date: 09/20/2019  Lower Venous Study Indications: Swelling.  Comparison Study: no prior Performing Technologist: Blanch Media RVS  Examination Guidelines: A complete evaluation includes B-mode imaging, spectral Doppler, color  Doppler, and power Doppler as needed of all accessible portions of each vessel. Bilateral testing is considered an integral part of a complete examination. Limited examinations for reoccurring indications may be performed as noted.  +-----+---------------+---------+-----------+----------+--------------+ RIGHTCompressibilityPhasicitySpontaneityPropertiesThrombus Aging +-----+---------------+---------+-----------+----------+--------------+ CFV  Full           Yes      Yes                                 +-----+---------------+---------+-----------+----------+--------------+   +---------+---------------+---------+-----------+----------+--------------+ LEFT     CompressibilityPhasicitySpontaneityPropertiesThrombus Aging +---------+---------------+---------+-----------+----------+--------------+ CFV      Full           Yes      Yes                                 +---------+---------------+---------+-----------+----------+--------------+ SFJ      Full                                                        +---------+---------------+---------+-----------+----------+--------------+ FV Prox  Full                                                        +---------+---------------+---------+-----------+----------+--------------+ FV Mid   Full                                                        +---------+---------------+---------+-----------+----------+--------------+ FV DistalFull                                                         +---------+---------------+---------+-----------+----------+--------------+ PFV      Full                                                        +---------+---------------+---------+-----------+----------+--------------+ POP      Full           Yes      Yes                                 +---------+---------------+---------+-----------+----------+--------------+ PTV      Full                                         sluggish       +---------+---------------+---------+-----------+----------+--------------+ PERO     Full  sluggish       +---------+---------------+---------+-----------+----------+--------------+     Summary: Right: No evidence of common femoral vein obstruction. Left: There is no evidence of deep vein thrombosis in the lower extremity. No cystic structure found in the popliteal fossa.  *See table(s) above for measurements and observations.    Preliminary     Procedures Procedures (including critical care time)  Medications Ordered in ED Medications - No data to display  ED Course  I have reviewed the triage vital signs and the nursing notes.  Pertinent labs & imaging results that were available during my care of the patient were reviewed by me and considered in my medical decision making (see chart for details).  Clinical Course as of Sep 19 2108  Fri Sep 20, 2019  1830 Ultrasound shows no evidence of DVT, ultrasound technician did note slowed blood flow which could be a risk factor for future DVTs.  Will discuss this with patient.  VAS [place] LOWER EXTREMITY VENOUS (DVT) ([facility] 7a-7p) [KF]    Clinical Course User Index [KF] Janet Berlin   MDM Rules/Calculators/A&P  29 year old female sent from urgent care for a week of intermittent posterior thigh pain worsening today to rule out DVT.  Vascular ultrasound today shows no evidence of blood clot, patient is not having any associated chest pain or  shortness of breath to suggest PE and she has no tachycardia, tachypnea or hypoxia.  She has no risk factors for PE or DVT.  Ultrasound technician did note sluggish blood flow in the venous system of the left lower extremity, which could put her at risk for future DVTs will have patient begin taking daily baby aspirin and I have encouraged her to walk frequently, elevate the legs, and use compression stockings if need be.  She does not have any edema in the foot is warm and well perfused with good distal pulses today.  We will have her follow-up with PCP if symptoms are persistent over the next week or 2.  Patient expresses understanding and agreement, discharged home in good condition.  Final Clinical Impression(s) / ED Diagnoses Final diagnoses:  Left leg pain    Rx / DC Orders ED Discharge Orders    None       Janet Berlin 09/20/19 2110    Valarie Merino, MD 09/23/19 2317

## 2019-09-20 NOTE — ED Notes (Signed)
Vascular tech in room with pt at present time

## 2019-09-20 NOTE — ED Triage Notes (Signed)
Pt reports posterior left thigh pain. Pt described pain as throbbing. Pt was sent here from UC to rule out DVT.

## 2019-10-23 IMAGING — US US OB < 14 WEEKS - US OB TV
1 series · 15 of 28 positions shown · non-contrast
Comparison: Ob ultrasound 12/31/2016

CLINICAL DATA: Pregnant patient. Uncertain last menstrual
period/dating.

EXAM:
OBSTETRIC <14 WK US AND TRANSVAGINAL OB US
TECHNIQUE: Both transabdominal and transvaginal ultrasound examinations were
performed for complete evaluation of the gestation as well as the
maternal uterus, adnexal regions, and pelvic cul-de-sac.
Transvaginal technique was performed to assess early pregnancy.

[Series 1: us ob < 14 weeks - us ob tv · 15 of 93 slices shown]
[im 1/93]
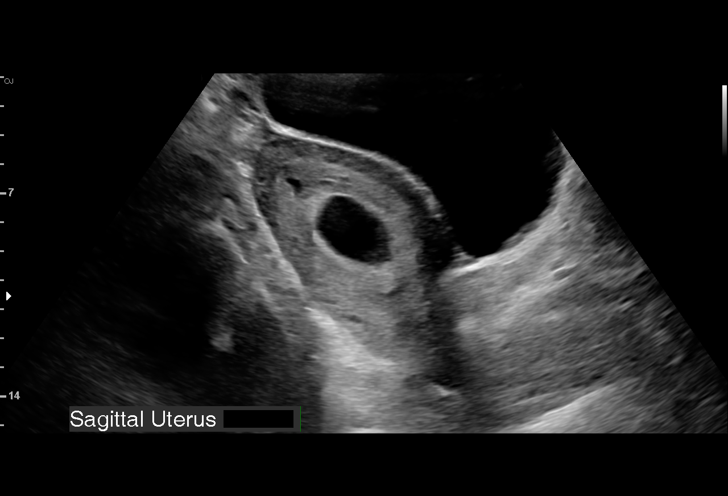
[im 7/93]
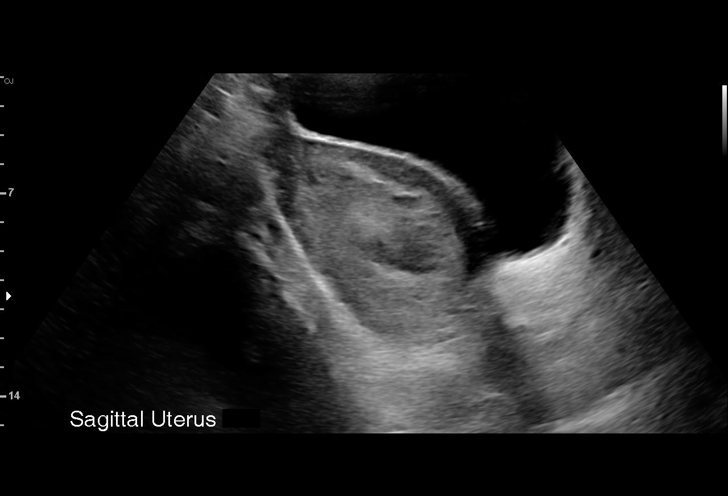
[im 14/93]
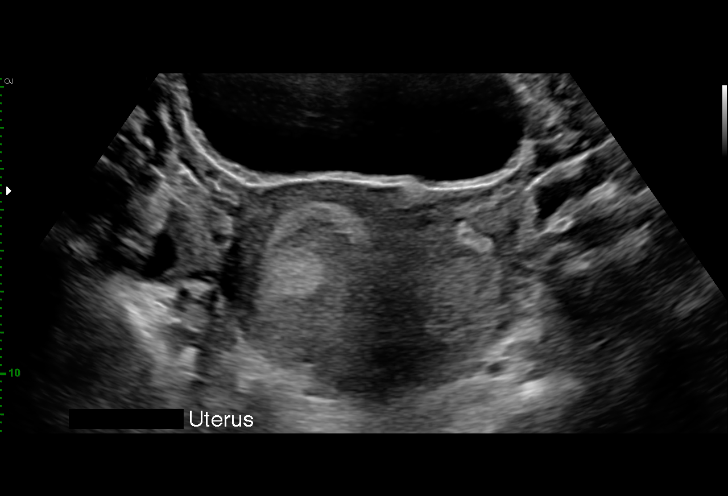
[im 21/93]
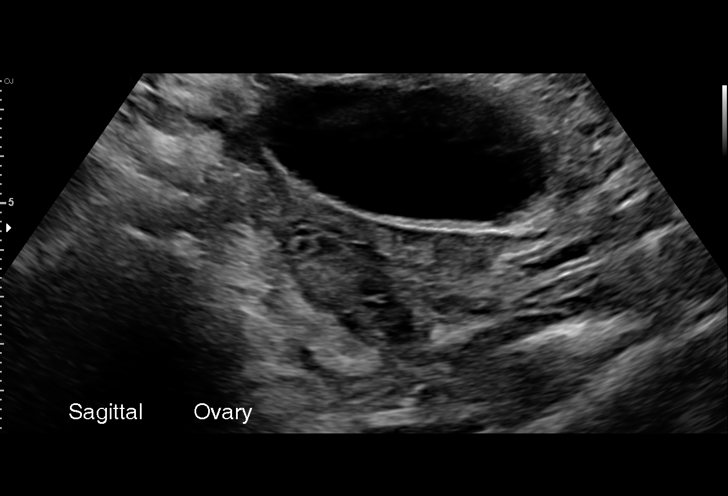
[im 28/93]
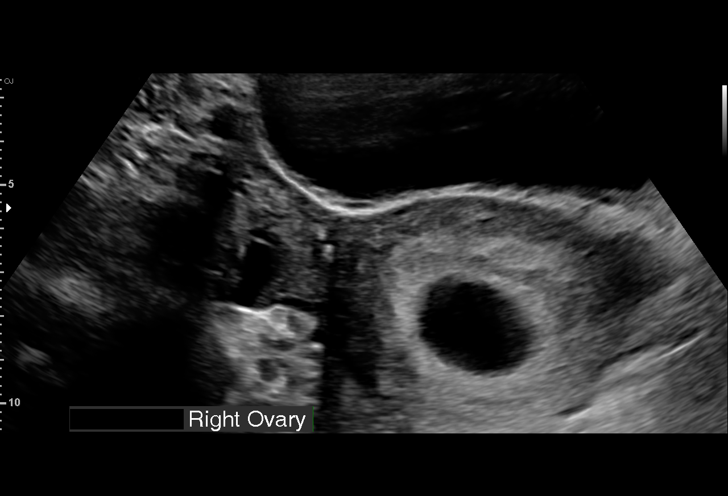
[im 35/93]
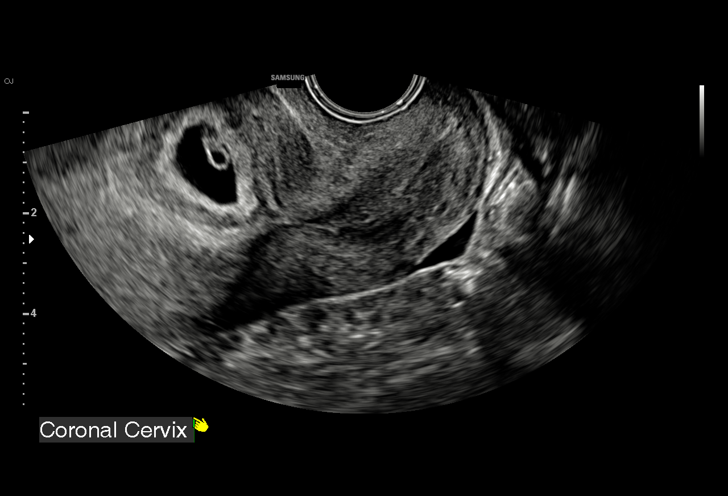
[im 41/93]
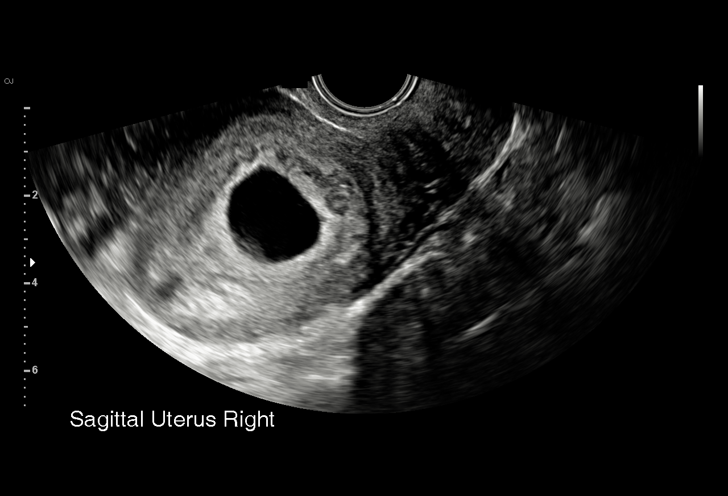
[im 48/93]
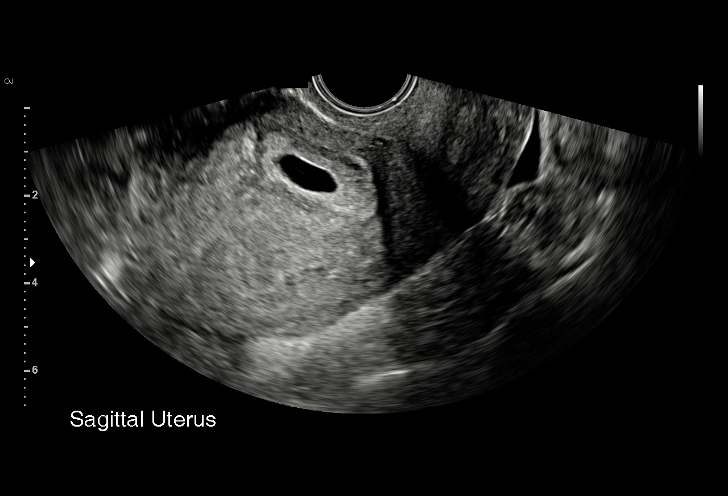
[im 52/93]
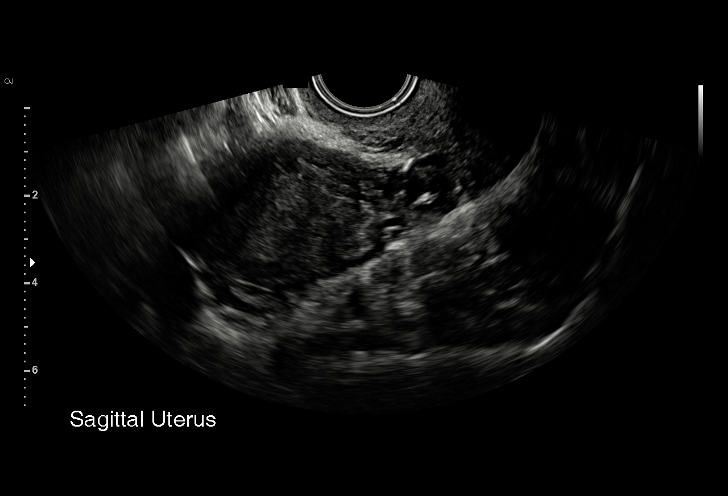
[im 58/93]
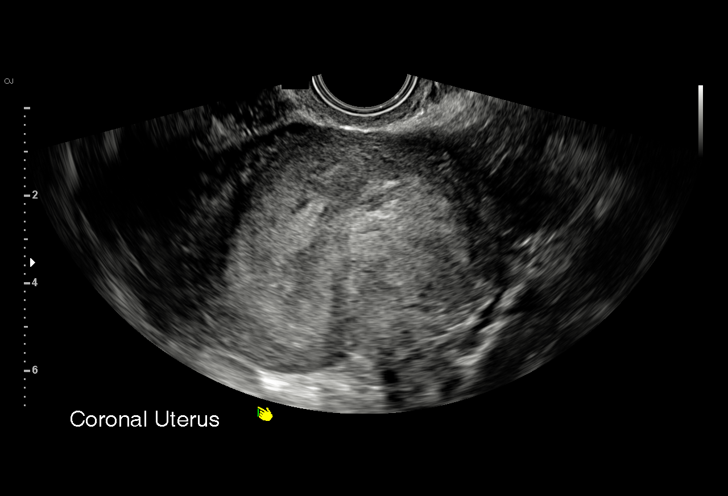
[im 65/93]
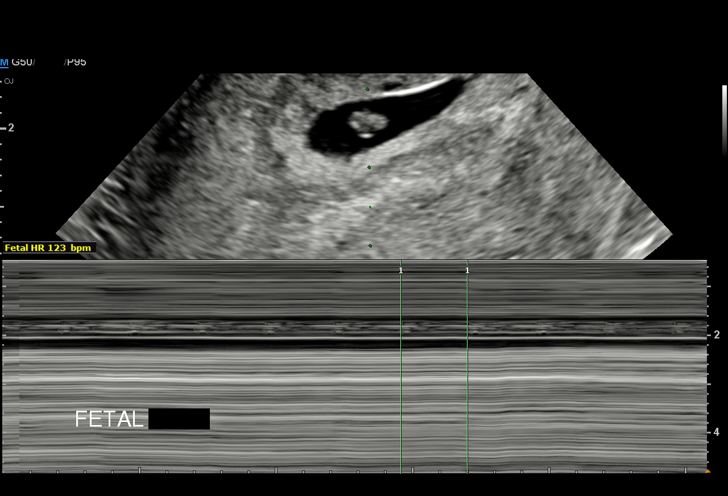
[im 72/93]
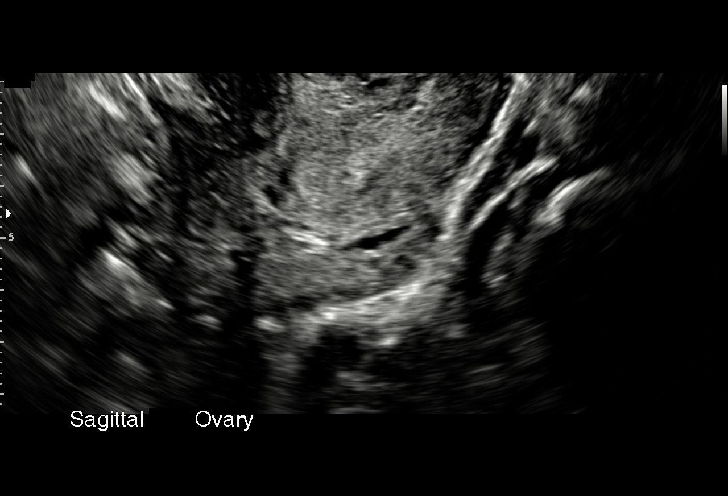
[im 79/93]
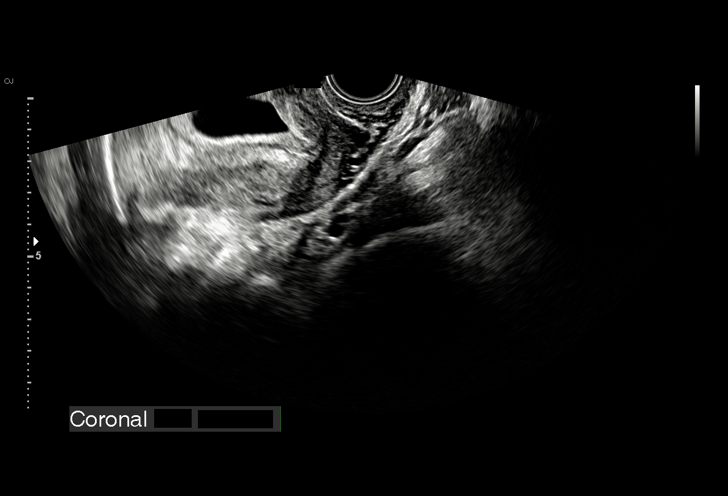
[im 86/93]
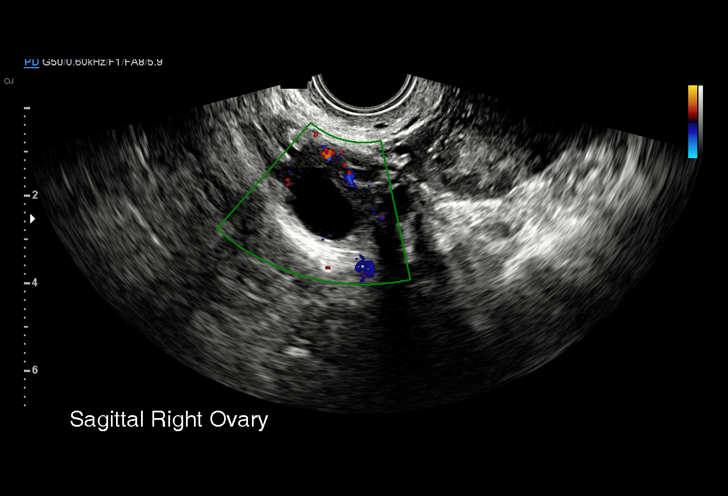
[im 93/93]
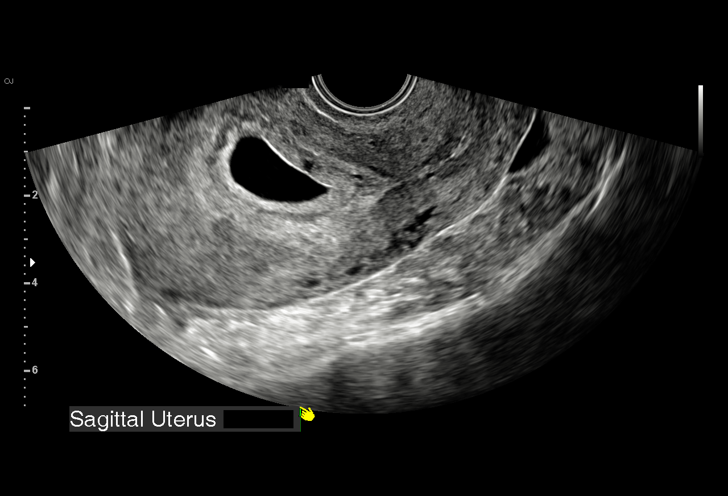

[15 of 28 positions shown; findings below may reference images not displayed]

FINDINGS: Intrauterine gestational sac: Single

Yolk sac:  Visualized.

Embryo:  Visualized.

Cardiac Activity: Visualized.

Heart Rate: 123 bpm

CRL:  7 mm   6 w   4 d                  US EDC: 09/20/2018

Subchorionic hemorrhage:  Small

Maternal uterus/adnexae: Normal right and left ovary. Corpus luteum
right ovary. Trace free fluid in the pelvis.
IMPRESSION: Single live intrauterine gestation.  Small subchorionic hemorrhage.

## 2019-10-28 ENCOUNTER — Ambulatory Visit (INDEPENDENT_AMBULATORY_CARE_PROVIDER_SITE_OTHER)
Admission: RE | Admit: 2019-10-28 | Discharge: 2019-10-28 | Disposition: A | Discharge status: Home / Self Care | Payer: Medicaid Other | Source: Ambulatory Visit

## 2019-10-28 ENCOUNTER — Ambulatory Visit: Payer: Medicaid Other | Admitting: Nurse Practitioner

## 2019-10-28 DIAGNOSIS — Z20822 Contact with and (suspected) exposure to covid-19: Secondary | ICD-10-CM

## 2019-10-28 DIAGNOSIS — R6889 Other general symptoms and signs: Secondary | ICD-10-CM

## 2019-10-28 MED ORDER — CETIRIZINE HCL 10 MG PO TABS: 10.0000 mg | ORAL_TABLET | Freq: Every day | ORAL | 0 refills | Status: DC

## 2019-10-28 MED ORDER — FLUTICASONE PROPIONATE 50 MCG/ACT NA SUSP: 2.0000 | Freq: Every day | NASAL | 0 refills | Status: DC

## 2019-10-28 NOTE — Discharge Instructions (Addendum)
COVID testing ordered.  You may text "COVID" to [phone], call [phone], OR log on to [email or web address] to make an appointment  In the meantime: You should remain isolated in your home for 10 days from symptom onset AND greater than 72 hours after symptoms resolution (absence of fever without the use of fever-reducing medication and improvement in respiratory symptoms), whichever is longer Get plenty of rest and push fluids You may use OTC zyrtec for nasal congestion, runny nose, and/or sore throat You may use OTC flonase as needed for nasal congestion and runny nose Use medications daily for symptom relief Use OTC medications like ibuprofen or tylenol as needed fever or pain Call or go to the ED if you have any new or worsening symptoms such as fever, cough, shortness of breath, chest tightness, chest pain, turning blue, changes in mental status, etc..Marland Kitchen

## 2019-10-28 NOTE — ED Provider Notes (Signed)
[facility] CARE CENTER    Virtual Visit via Video Note:  [name]  initiated request for Telemedicine visit with [facility] team. I connected with [name]  on 10/28/2019 at 7:01 PM  for a synchronized telemedicine visit using a video enabled HIPPA compliant telemedicine application. I verified that I am speaking with [name]  using two identifiers. Rennis Harding, PA-C  was physically located in a [facility] Urgent care site and [name] was located at a different location.   The limitations of evaluation and management by telemedicine as well as the availability of in-person appointments were discussed. Patient was informed that she  may incur a bill ( including co-pay) for this virtual visit encounter. [name]  expressed understanding and gave verbal consent to proceed with virtual visit.  [id number] 10/28/19 Arrival Time: 1851  Cc: Flu symptoms  SUBJECTIVE:  [name] is a 29 y.o. female who presents with body aches, headache, and sore throat x 1 week, fatigue, chills, and fever, with tmax of 100.8, x 1 day.  Denies sick exposure to COVID, flu or strep.  Denies recent travel.   Has tried tylenol with relief.  Denies aggravating factors. Reports previous symptoms in the past and diagnosed with the flu.   Denies congestion, active rhinorrhea, cough, SOB, wheezing, chest pain, nausea, vomiting, changes in bowel or bladder habits.    Tested for COVID 1 month ago.  Negative.    ROS: As per HPI.  All other pertinent ROS negative.     Past Medical History:  Diagnosis Date  . Anemia   . Anxiety   . Asthma    exercise induced  . Complication of anesthesia   . Head injury, closed    scalp laceration, dizzy for a few minutes  . History of chlamydia   . PONV (postoperative nausea and vomiting)   . Von Willebrand disease (HCC)    Past Surgical History:  Procedure Laterality Date  . HAND SURGERY Left 2009   thumb , fracture  . HAND SURGERY Left 2006     Pins placed  . HARDWARE REMOVAL Left 02/10/2016   Procedure: LEFT SMALL FINGER DEEP IMPLANT REMOVAL;  Surgeon: Bradly Bienenstock, MD;  Location: [facility] OR;  Service: Orthopedics;  Laterality: Left;  . OPEN REDUCTION INTERNAL FIXATION (ORIF) PROXIMAL PHALANX Left 01/06/2016   Procedure: OPEN REDUCTION INTERNAL FIXATION (ORIF) LEFT SMALL FINGER;  Surgeon: Bradly Bienenstock, MD;  Location: [facility] OR;  Service: Orthopedics;  Laterality: Left;   Allergies  Allergen Reactions  . Bee Venom Anaphylaxis  . Adhesive [Tape] Itching and Rash   No current facility-administered medications on file prior to encounter.   Current Outpatient Medications on File Prior to Encounter  Medication Sig Dispense Refill  . acetaminophen (TYLENOL) 325 MG tablet Take 650 mg by mouth every 6 (six) hours as needed for mild pain or headache.     . albuterol (PROVENTIL HFA;VENTOLIN HFA) 108 (90 Base) MCG/ACT inhaler Inhale 1-2 puffs into the lungs every 4 (four) hours as needed for wheezing or shortness of breath. 1 Inhaler 3  . EPINEPHrine 0.3 mg/0.3 mL IJ SOAJ injection Inject 0.3 mLs (0.3 mg total) into the muscle once as needed. As needed for bee stings 2 Device 0  . FERROCITE 324 MG TABS tablet Take 1 tablet by mouth daily.  1  . Prenatal Vit-Fe Fumarate-FA (PRENATAL MULTIVITAMIN) TABS tablet Take 1 tablet by mouth at bedtime.      OBJECTIVE:  There were no vitals  filed for this visit.   General appearance: alert; no distress Eyes: EOMI grossly HENT: normocephalic; atraumatic Neck: supple with FROM Lungs: normal respiratory effort; speaking in full sentences without difficulty Extremities: moves extremities without difficulty Skin: No obvious rashes Neurologic: No facial asymmetries Psychological: alert and cooperative; normal mood and affect   ASSESSMENT & PLAN:  1. Encounter by telehealth for suspected COVID-19     Meds ordered this encounter  Medications  . cetirizine (ZYRTEC) 10 MG tablet    Sig: Take 1 tablet (10  mg total) by mouth daily.    Dispense:  30 tablet    Refill:  0    Order Specific Question:   Supervising Provider    Answer:   Raylene Everts [[id number]]  . fluticasone (FLONASE) 50 MCG/ACT nasal spray    Sig: Place 2 sprays into both nostrils daily.    Dispense:  16 g    Refill:  0    Order Specific Question:   Supervising Provider    Answer:   Raylene Everts [[id number]]     COVID testing ordered.  You may text "COVID" to [phone], call [phone], OR log on to [email or web address] to make an appointment  In the meantime: You should remain isolated in your home for 10 days from symptom onset AND greater than 72 hours after symptoms resolution (absence of fever without the use of fever-reducing medication and improvement in respiratory symptoms), whichever is longer Get plenty of rest and push fluids You may use OTC zyrtec for nasal congestion, runny nose, and/or sore throat You may use OTC flonase as needed for nasal congestion and runny nose Use medications daily for symptom relief Use OTC medications like ibuprofen or tylenol as needed fever or pain Call or go to the ED if you have any new or worsening symptoms such as fever, cough, shortness of breath, chest tightness, chest pain, turning blue, changes in mental status, etc...   I discussed the assessment and treatment plan with the patient. The patient was provided an opportunity to ask questions and all were answered. The patient agreed with the plan and demonstrated an understanding of the instructions.   The patient was advised to call back or seek an in-person evaluation if the symptoms worsen or if the condition fails to improve as anticipated.  I provided 10 minutes of non-face-to-face time during this encounter.  [place], PA-C  10/28/2019 7:01 PM           Lestine Box, PA-C 10/28/19 1902

## 2019-10-30 ENCOUNTER — Ambulatory Visit: Payer: Medicaid Other | Attending: Internal Medicine

## 2019-10-30 DIAGNOSIS — Z20822 Contact with and (suspected) exposure to covid-19: Secondary | ICD-10-CM

## 2019-10-31 LAB — NOVEL CORONAVIRUS, NAA: SARS-CoV-2, NAA: DETECTED — AB

## 2019-11-25 ENCOUNTER — Ambulatory Visit (INDEPENDENT_AMBULATORY_CARE_PROVIDER_SITE_OTHER): Payer: Medicaid Other | Admitting: Nurse Practitioner

## 2019-11-25 ENCOUNTER — Other Ambulatory Visit: Payer: Self-pay

## 2019-11-25 ENCOUNTER — Other Ambulatory Visit (HOSPITAL_COMMUNITY)
Admission: RE | Admit: 2019-11-25 | Discharge: 2019-11-25 | Disposition: A | Discharge status: Home / Self Care | Payer: Medicaid Other | Source: Ambulatory Visit | Attending: Nurse Practitioner | Admitting: Nurse Practitioner

## 2019-11-25 ENCOUNTER — Encounter: Payer: Self-pay | Admitting: Nurse Practitioner

## 2019-11-25 VITALS — BP 111/67 | HR 69 | Ht 64.0 in | Wt 214.4 lb

## 2019-11-25 DIAGNOSIS — Z6836 Body mass index (BMI) 36.0-36.9, adult: Secondary | ICD-10-CM | POA: Insufficient documentation

## 2019-11-25 DIAGNOSIS — Z0000 Encounter for general adult medical examination without abnormal findings: Secondary | ICD-10-CM

## 2019-11-25 DIAGNOSIS — Z01419 Encounter for gynecological examination (general) (routine) without abnormal findings: Secondary | ICD-10-CM | POA: Diagnosis present

## 2019-11-25 DIAGNOSIS — I83813 Varicose veins of bilateral lower extremities with pain: Secondary | ICD-10-CM

## 2019-11-25 DIAGNOSIS — N393 Stress incontinence (female) (male): Secondary | ICD-10-CM | POA: Insufficient documentation

## 2019-11-25 MED ORDER — EPINEPHRINE 0.3 MG/0.3ML IJ SOAJ: 0.3000 mg | Freq: Once | INTRAMUSCULAR | 2 refills | Status: DC | PRN

## 2019-11-25 MED ORDER — ALBUTEROL SULFATE HFA 108 (90 BASE) MCG/ACT IN AERS: 1.0000 | INHALATION_SPRAY | RESPIRATORY_TRACT | 2 refills | Status: AC | PRN

## 2019-11-25 MED ORDER — EPINEPHRINE 0.3 MG/0.3ML IJ SOAJ: 0.3000 mg | Freq: Once | INTRAMUSCULAR | 2 refills | Status: AC | PRN

## 2019-11-25 NOTE — Progress Notes (Signed)
GYNECOLOGY ANNUAL PREVENTATIVE CARE ENCOUNTER NOTE  Subjective:   [name] is a 29 y.o. G29P0020 female here for a routine annual gynecologic exam.  Current complaints: needs medications refilled.  Has Medicaid and has tried to get an established PCP but has not been able to find one who takes Medicaid.  Some choices reviewed with her to call but will refill her most needed medications at this time.   Denies abnormal vaginal bleeding, discharge, pelvic pain, problems with intercourse or other gynecologic concerns.    Gynecologic History Patient's last menstrual period was 11/25/2019 (exact date). Contraception: condoms Last Pap: 2017. Results were: normal   Obstetric History OB History  Gravida Para Term Preterm AB Living  3       2 0  SAB TAB Ectopic Multiple Live Births  2            # Outcome Date GA Lbr Len/2nd Weight Sex Delivery Anes PTL Lv  3 Gravida           2 SAB 12/18/16 [redacted]w[redacted]d    SAB     1 SAB 03/28/16 [redacted]w[redacted]d    SAB       Past Medical History:  Diagnosis Date  . Anemia   . Anxiety   . Asthma    exercise induced  . Complication of anesthesia   . Head injury, closed    scalp laceration, dizzy for a few minutes  . History of chlamydia   . PONV (postoperative nausea and vomiting)   . Von Willebrand disease (HCC)     Past Surgical History:  Procedure Laterality Date  . HAND SURGERY Left 2009   thumb , fracture  . HAND SURGERY Left 2006   Pins placed  . HARDWARE REMOVAL Left 02/10/2016   Procedure: LEFT SMALL FINGER DEEP IMPLANT REMOVAL;  Surgeon: Bradly Bienenstock, MD;  Location: [facility] OR;  Service: Orthopedics;  Laterality: Left;  . OPEN REDUCTION INTERNAL FIXATION (ORIF) PROXIMAL PHALANX Left 01/06/2016   Procedure: OPEN REDUCTION INTERNAL FIXATION (ORIF) LEFT SMALL FINGER;  Surgeon: Bradly Bienenstock, MD;  Location: [facility] OR;  Service: Orthopedics;  Laterality: Left;    Current Outpatient Medications on File Prior to Visit  Medication Sig Dispense Refill  .  acetaminophen (TYLENOL) 325 MG tablet Take 650 mg by mouth every 6 (six) hours as needed for mild pain or headache.     . albuterol (PROVENTIL HFA;VENTOLIN HFA) 108 (90 Base) MCG/ACT inhaler Inhale 1-2 puffs into the lungs every 4 (four) hours as needed for wheezing or shortness of breath. (Patient not taking: Reported on 11/25/2019) 1 Inhaler 3  . cetirizine (ZYRTEC) 10 MG tablet Take 1 tablet (10 mg total) by mouth daily. (Patient not taking: Reported on 11/25/2019) 30 tablet 0  . EPINEPHrine 0.3 mg/0.3 mL IJ SOAJ injection Inject 0.3 mLs (0.3 mg total) into the muscle once as needed. As needed for bee stings (Patient not taking: Reported on 11/25/2019) 2 Device 0  . Prenatal Vit-Fe Fumarate-FA (PRENATAL MULTIVITAMIN) TABS tablet Take 1 tablet by mouth at bedtime.     No current facility-administered medications on file prior to visit.    Allergies  Allergen Reactions  . Bee Venom Anaphylaxis  . Adhesive [Tape] Itching and Rash    Social History   Socioeconomic History  . Marital status: Single    Spouse name: Not on file  . Number of children: Not on file  . Years of education: Not on file  . Highest education level: Not on  file  Occupational History  . Not on file  Tobacco Use  . Smoking status: Never Smoker  . Smokeless tobacco: Never Used  Substance and Sexual Activity  . Alcohol use: Not Currently    Comment: Currently no drinking due to pregnancy  . Drug use: No  . Sexual activity: Not Currently    Birth control/protection: None  Other Topics Concern  . Not on file  Social History Narrative  . Not on file   Social Determinants of Health   Financial Resource Strain:   . Difficulty of Paying Living Expenses: Not on file  Food Insecurity:   . Worried About Charity fundraiser in the Last Year: Not on file  . Ran Out of Food in the Last Year: Not on file  Transportation Needs:   . Lack of Transportation (Medical): Not on file  . Lack of Transportation (Non-Medical):  Not on file  Physical Activity:   . Days of Exercise per Week: Not on file  . Minutes of Exercise per Session: Not on file  Stress:   . Feeling of Stress : Not on file  Social Connections:   . Frequency of Communication with Friends and Family: Not on file  . Frequency of Social Gatherings with Friends and Family: Not on file  . Attends Religious Services: Not on file  . Active Member of Clubs or Organizations: Not on file  . Attends Archivist Meetings: Not on file  . Marital Status: Not on file  Intimate Partner Violence:   . Fear of Current or Ex-Partner: Not on file  . Emotionally Abused: Not on file  . Physically Abused: Not on file  . Sexually Abused: Not on file    Family History  Problem Relation Age of Onset  . Diabetes Father   . Hypertension Father   . Hypercholesterolemia Father   . Heart disease Father   . Cancer Paternal Grandfather   . Cancer Paternal Grandmother     The following portions of the patient's history were reviewed and updated as appropriate: allergies, current medications, past family history, past medical history, past social history, past surgical history and problem list.  Review of Systems Pertinent items noted in HPI and remainder of comprehensive ROS otherwise negative.   Objective:  BP 111/67   Pulse 69   Ht 5\' 4"  (1.626 m)   Wt 214 lb 6.4 oz (97.3 kg)   LMP 11/25/2019 (Exact Date)   BMI 36.80 kg/m  CONSTITUTIONAL: Well-developed, well-nourished female in no acute distress.  HENT:  Normocephalic, atraumatic, External right and left ear normal.  EYES: Conjunctivae and EOM are normal. Pupils are equal, round.  No scleral icterus.  NECK: Normal range of motion, supple, no masses.  Normal thyroid.  SKIN: Skin is warm and dry. No rash noted. Not diaphoretic. No erythema. No pallor. NEUROLOGIC: Alert and oriented to person, place, and time. Normal reflexes, muscle tone coordination. No cranial nerve deficit noted. PSYCHIATRIC:  Normal mood and affect. Normal behavior. Normal judgment and thought content. CARDIOVASCULAR: Normal heart rate noted, regular rhythm RESPIRATORY: Clear to auscultation bilaterally. Effort and breath sounds normal, no problems with respiration noted. BREASTS: Symmetric in size. No masses, skin changes, nipple drainage, or lymphadenopathy. ABDOMEN: Soft, no distention noted.  No tenderness, rebound or guarding.  PELVIC: Normal appearing external genitalia; normal appearing vaginal mucosa and cervix.  No abnormal discharge noted.  Pap smear obtained.  Normal uterine size, no other palpable masses, no uterine or adnexal tenderness. MUSCULOSKELETAL:  Normal range of motion. No tenderness.  No cyanosis, clubbing, or edema.    Assessment and Plan:  1. Women's annual routine gynecological examination  - Cytology - PAP( [facility])   2. BMI 36.0-36.9,adult Reviewed the importance for good health to reduce weight to BMI of less than 25.  Client is motivated and agrees to investigate [employer].  Reviewed the importance of being aware of her habits.  3.  Varicose veins Seen in ER for leg pain.  Discussed veins and use of compression hose to help with pain  4.  Mild urinary stress incontinence When sneezing.  Reviewed Kegel Exercises.  Advised to notfy office if this does not improve.  Referral to PT can be made if needed.   Advised to see PCP but client has difficulty getting an visit due to [facility] insurance - refilled Epipen and albuterol inhaler but adivsed to work on establishing with a PCP. Will follow up results of pap smear and manage accordingly. Mammogram scheduled Routine preventative health maintenance measures emphasized. Please refer to After Visit Summary for other counseling recommendations.    Nolene Bernheim, RN, MSN, NP-BC Nurse Practitioner, [facility] for [facility]

## 2019-11-25 NOTE — Patient Instructions (Addendum)
[facility] Practices  Support stockings Levonne Hubert for compression knee highs   Kegel Exercises  Kegel exercises can help strengthen your pelvic floor muscles. The pelvic floor is a group of muscles that support your rectum, small intestine, and bladder. In females, pelvic floor muscles also help support the womb (uterus). These muscles help you control the flow of urine and stool. Kegel exercises are painless and simple, and they do not require any equipment. Your provider may suggest Kegel exercises to:  Improve bladder and bowel control.  Improve sexual response.  Improve weak pelvic floor muscles after surgery to remove the uterus (hysterectomy) or pregnancy (females).  Improve weak pelvic floor muscles after prostate gland removal or surgery (males). Kegel exercises involve squeezing your pelvic floor muscles, which are the same muscles you squeeze when you try to stop the flow of urine or keep from passing gas. The exercises can be done while sitting, standing, or lying down, but it is best to vary your position. Exercises How to do Kegel exercises: 1. Squeeze your pelvic floor muscles tight. You should feel a tight lift in your rectal area. If you are a female, you should also feel a tightness in your vaginal area. Keep your stomach, buttocks, and legs relaxed. 2. Hold the muscles tight for up to 10 seconds. 3. Breathe normally. 4. Relax your muscles. 5. Repeat as told by your health care provider. Repeat this exercise daily as told by your health care provider. Continue to do this exercise for at least 4-6 weeks, or for as long as told by your health care provider. You may be referred to a physical therapist who can help you learn more about how to do Kegel exercises. Depending on your condition, your health care provider may recommend:  Varying how long you squeeze your muscles.  Doing several sets of exercises every  day.  Doing exercises for several weeks.  Making Kegel exercises a part of your regular exercise routine. This information is not intended to replace advice given to you by your health care provider. Make sure you discuss any questions you have with your health care provider. Document Revised: 05/16/2018 Document Reviewed: 05/16/2018 Elsevier Patient Education  2020 [employer].

## 2019-11-26 LAB — CYTOLOGY - PAP: Diagnosis: NEGATIVE

## 2020-05-28 ENCOUNTER — Other Ambulatory Visit: Payer: Self-pay

## 2020-05-28 ENCOUNTER — Emergency Department (HOSPITAL_COMMUNITY): Payer: Medicaid Other

## 2020-05-28 ENCOUNTER — Emergency Department (HOSPITAL_COMMUNITY)
Admission: EM | Admit: 2020-05-28 | Discharge: 2020-05-28 | Disposition: A | Discharge status: Home / Self Care | Payer: Medicaid Other | Attending: Emergency Medicine | Admitting: Emergency Medicine

## 2020-05-28 ENCOUNTER — Encounter (HOSPITAL_COMMUNITY): Payer: Self-pay

## 2020-05-28 DIAGNOSIS — Y929 Unspecified place or not applicable: Secondary | ICD-10-CM | POA: Insufficient documentation

## 2020-05-28 DIAGNOSIS — W500XXA Accidental hit or strike by another person, initial encounter: Secondary | ICD-10-CM | POA: Diagnosis not present

## 2020-05-28 DIAGNOSIS — S6391XA Sprain of unspecified part of right wrist and hand, initial encounter: Secondary | ICD-10-CM | POA: Insufficient documentation

## 2020-05-28 DIAGNOSIS — J45909 Unspecified asthma, uncomplicated: Secondary | ICD-10-CM | POA: Diagnosis not present

## 2020-05-28 DIAGNOSIS — Y998 Other external cause status: Secondary | ICD-10-CM | POA: Diagnosis not present

## 2020-05-28 DIAGNOSIS — Y9361 Activity, american tackle football: Secondary | ICD-10-CM | POA: Diagnosis not present

## 2020-05-28 NOTE — ED Notes (Signed)
An After Visit Summary was printed and given to the patient. Discharge instructions given and no further questions at this time.  

## 2020-05-28 NOTE — ED Triage Notes (Signed)
Pt c/o right wrist pain during a football game. Pt states she heard a pop. Pt has been putting ice on wrist.

## 2020-05-28 NOTE — ED Provider Notes (Signed)
[facility]-EMERGENCY DEPT Provider Note   CSN: [id number] Arrival date & time: 05/28/20  1944     History Chief Complaint  Patient presents with  . Right Hand Pain    [name] is a 29 y.o. female who presents for evaluation of right thumb/hand pain that occurred after an injury at about 7 PM this evening.  She reports she was at practice and states that she hit her hand against another player which caused her thumb to bend back.  She states that since then, she has had pain, swelling noted at the base of her thumb into the radial aspect of her right hand.  She states that this is happened to her before and has had to have previous surgery on her left hand.  She states it is slightly numb after having ice on it but otherwise denies any other numbness/weakness.  The history is provided by the patient.       Past Medical History:  Diagnosis Date  . Anemia   . Anxiety   . Asthma    exercise induced  . Complication of anesthesia   . Head injury, closed    scalp laceration, dizzy for a few minutes  . History of chlamydia   . PONV (postoperative nausea and vomiting)   . Von Willebrand disease [facility])     Patient Active Problem List   Diagnosis Date Noted  . BMI 36.0-36.9,adult 11/25/2019  . Varicose veins of both lower extremities with pain 11/25/2019  . Stress incontinence in female 11/25/2019  . Influenza B 09/14/2018  . Von Willebrand disease (HCC) 12/31/2016  . History of multiple miscarriages 12/31/2016    Past Surgical History:  Procedure Laterality Date  . HAND SURGERY Left 2009   thumb , fracture  . HAND SURGERY Left 2006   Pins placed  . HARDWARE REMOVAL Left 02/10/2016   Procedure: LEFT SMALL FINGER DEEP IMPLANT REMOVAL;  Surgeon: Bradly Bienenstock, MD;  Location: [facility] OR;  Service: Orthopedics;  Laterality: Left;  . OPEN REDUCTION INTERNAL FIXATION (ORIF) PROXIMAL PHALANX Left 01/06/2016   Procedure: OPEN REDUCTION INTERNAL FIXATION (ORIF) LEFT  SMALL FINGER;  Surgeon: Bradly Bienenstock, MD;  Location: [facility] OR;  Service: Orthopedics;  Laterality: Left;     OB History    Gravida  3   Para      Term      Preterm      AB  2   Living  0     SAB  2   TAB      Ectopic      Multiple      Live Births              Family History  Problem Relation Age of Onset  . Diabetes Father   . Hypertension Father   . Hypercholesterolemia Father   . Heart disease Father   . Cancer Paternal Grandfather   . Cancer Paternal Grandmother     Social History   Tobacco Use  . Smoking status: Never Smoker  . Smokeless tobacco: Never Used  Vaping Use  . Vaping Use: Never used  Substance Use Topics  . Alcohol use: Not Currently    Comment: Currently no drinking due to pregnancy  . Drug use: No    Home Medications Prior to Admission medications   Medication Sig Start Date End Date Taking? Authorizing Provider  acetaminophen (TYLENOL) 325 MG tablet Take 650 mg by mouth every 6 (six) hours as needed for mild  pain or headache.     [provider]  albuterol (VENTOLIN HFA) 108 (90 Base) MCG/ACT inhaler Inhale 1-2 puffs into the lungs every 4 (four) hours as needed for wheezing or shortness of breath. 11/25/19   Burleson, Brand Males, NP  cetirizine (ZYRTEC) 10 MG tablet Take 1 tablet (10 mg total) by mouth daily. Patient not taking: Reported on 11/25/2019 10/28/19   Alvino Chapel, [place], PA-C  EPINEPHrine 0.3 mg/0.3 mL IJ SOAJ injection Inject 0.3 mLs (0.3 mg total) into the muscle once as needed for up to 1 dose. As needed for bee stings 11/25/19   Currie Paris, NP  Prenatal Vit-Fe Fumarate-FA (PRENATAL MULTIVITAMIN) TABS tablet Take 1 tablet by mouth at bedtime.    [provider]    Allergies    Bee venom and Adhesive [tape]  Review of Systems   Review of Systems  Musculoskeletal:       Hand pain  Neurological: Negative for weakness.  All other systems reviewed and are negative.   Physical Exam Updated Vital  Signs BP 108/77 (BP Location: Left Arm)   Pulse 96   Temp 98.7 F (37.1 C) (Oral)   Resp 16   Ht 5\' 3"  (1.6 m)   Wt 99.8 kg   LMP 05/21/2020   SpO2 100%   BMI 38.97 kg/m   Physical Exam Vitals and nursing note reviewed.  Constitutional:      Appearance: She is well-developed.  HENT:     Head: Normocephalic and atraumatic.  Eyes:     General: No scleral icterus.       Right eye: No discharge.        Left eye: No discharge.     Conjunctiva/sclera: Conjunctivae normal.  Cardiovascular:     Pulses:          Radial pulses are 2+ on the right side and 2+ on the left side.  Pulmonary:     Effort: Pulmonary effort is normal.  Musculoskeletal:     Comments: Tenderness palpation noted to the MCP of the right thumb that extends right to the snuffbox.  No deformity or crepitus noted.  Flexion/tension of the thumb intact.  She has both flexion and extension of the MCP and the IP joint.  Opposition of thumb intact with any difficulty.  No bony tenderness noted to the actual wrist.  No tenderness noted to other digits of hand.  Skin:    General: Skin is warm and dry.     Comments: Good distal cap refill. RUE is not dusky in appearance or cool to touch.  Neurological:     Mental Status: She is alert.     Comments: Sensation intact along major nerve distributions of BUE  Psychiatric:        Speech: Speech normal.        Behavior: Behavior normal.     ED Results / Procedures / Treatments   Labs (all labs ordered are listed, but only abnormal results are displayed) Labs Reviewed - No data to display  EKG None  Radiology DG Hand Complete Right  Result Date: 05/28/2020 CLINICAL DATA:  Right hand and wrist pain, injury EXAM: RIGHT HAND - COMPLETE 3+ VIEW COMPARISON:  04/08/2017 FINDINGS: Frontal, oblique, and lateral views of the right hand are obtained. No fracture, subluxation, or dislocation. Joint spaces are well preserved. Soft tissues are normal. IMPRESSION: 1. Unremarkable  right hand. Electronically Signed   By: 04/10/2017 M.D.   On: 05/28/2020 21:20  Procedures Procedures (including critical care time)  Medications Ordered in ED Medications - No data to display  ED Course  I have reviewed the triage vital signs and the nursing notes.  Pertinent labs & imaging results that were available during my care of the patient were reviewed by me and considered in my medical decision making (see chart for details).    MDM Rules/Calculators/A&P                          29 year old female who presents for evaluation of right hand pain.  She reports that she was at practice and states that she hit her hand against a teammate and states that it bent her thumb back.  Since then has had pain to the thumb.  She states that she has had previous injuries to her thumb before and has seen Dr. Orlan Leavens.  On initial arrival, she is afebrile, nontoxic-appearing.  Vital signs are stable.  Patient with good radial pulses, good distal cap refill.  She has had an ice pack on it and states that it is numb from the ice pack but otherwise with good sensation.  Patient has tenderness noted to the MCP and the radial aspect of the right hand that extends to the snuffbox.  No deformity or crepitus noted.  No tenderness palpation of the wrist.  Concern for hand injury such as fracture versus dislocation.  X-rays ordered.  X-ray reviewed.  Negative for any acute bony abnormality.  Discussed results with patient.  Discussed with her that there could still be a musculoskeletal/ligament injury that could be contributing to her symptoms.  We will plan to put her in a wrist splint with thumb spica for support and stabilization.  Given her tenderness, I discussed with patient that she should follow-up with orthopedics.  She has previously seen Dr. Orlan Leavens with [facility].  Patient given referral to their office. At this time, patient exhibits no emergent life-threatening condition that require  further evaluation in ED or admission. Patient had ample opportunity for questions and discussion. All patient's questions were answered with full understanding. Strict return precautions discussed. Patient expresses understanding and agreement to plan.   Portions of this note were generated with Scientist, clinical (histocompatibility and immunogenetics). Dictation errors may occur despite best attempts at proofreading.   Final Clinical Impression(s) / ED Diagnoses Final diagnoses:  Sprain of right hand, initial encounter    Rx / DC Orders ED Discharge Orders    None       Rosana Hoes 05/28/20 2140    Alvira Monday, MD 05/28/20 2143

## 2020-05-28 NOTE — Discharge Instructions (Signed)
As we discussed, your x-ray was reassuring today.  There was no evidence of broken bone.  As we discussed, there still could be a ligament or muscle injury.  We will treat this as a sprain.  Wear support brace for support and stabilization.  You can take Tylenol or Ibuprofen as directed for pain. You can alternate Tylenol and Ibuprofen every 4 hours. If you take Tylenol at 1pm, then you can take Ibuprofen at 5pm. Then you can take Tylenol again at 9pm.   Follow the RICE (Rest, Ice, Compression, Elevation) protocol as directed.   If you do not have any improvement in your symptoms in 1 to 2 weeks, you can follow-up with referred orthopedic doctor for further evaluation.  Return to emergency department for any worsening pain, numbness/weakness of your arms or fingers or any other worsening concerning symptoms.

## 2020-06-26 ENCOUNTER — Telehealth (INDEPENDENT_AMBULATORY_CARE_PROVIDER_SITE_OTHER): Payer: Medicaid Other | Admitting: Lactation Services

## 2020-06-26 DIAGNOSIS — N6452 Nipple discharge: Secondary | ICD-10-CM

## 2020-06-26 NOTE — Telephone Encounter (Signed)
[facility] and she is having issues with one of her breasts. She would like a call back to discuss.

## 2020-06-29 NOTE — Addendum Note (Signed)
Addended by: Kathee Delton on: 06/29/2020 03:50 PM   Modules accepted: Orders

## 2020-06-29 NOTE — Telephone Encounter (Signed)
Called patient and she reports she is still producing breast milk but she stopped breastfeeding in October/November 2020. Patient also reports for the past couple of months she has noticed a brown/red, rusty color discharge coming from her right nipple. She reports some itching there as well but denies pain or discoloration. Per Dr Donavan Foil, patient should have bilateral breast ultrasound then follow up appt with a provider, possibly prolactin lab as well. Order placed & scheduled breast ultrasound for 9/27 @ 330. Discussed with patient [facility] offices does not accept her new insurance plan and recommend she try to locate another provider in the area to see instead. Patient verbalized understanding & had no questions.

## 2020-07-06 ENCOUNTER — Ambulatory Visit
Admission: RE | Admit: 2020-07-06 | Discharge: 2020-07-06 | Disposition: A | Discharge status: Home / Self Care | Payer: Medicaid Other | Source: Ambulatory Visit | Attending: Obstetrics and Gynecology | Admitting: Obstetrics and Gynecology

## 2020-07-06 ENCOUNTER — Other Ambulatory Visit: Payer: Self-pay

## 2020-07-06 DIAGNOSIS — N6452 Nipple discharge: Secondary | ICD-10-CM

## 2020-07-15 ENCOUNTER — Encounter: Payer: Self-pay | Admitting: Obstetrics and Gynecology

## 2020-07-15 ENCOUNTER — Other Ambulatory Visit: Payer: Self-pay

## 2020-07-15 ENCOUNTER — Ambulatory Visit (INDEPENDENT_AMBULATORY_CARE_PROVIDER_SITE_OTHER): Payer: Medicaid Other | Admitting: Obstetrics and Gynecology

## 2020-07-15 VITALS — BP 106/64 | HR 68 | Wt 215.3 lb

## 2020-07-15 DIAGNOSIS — N643 Galactorrhea not associated with childbirth: Secondary | ICD-10-CM

## 2020-07-15 NOTE — Progress Notes (Signed)
GYNECOLOGY OFFICE FOLLOW UP NOTE  History:  29 y.o. [id number] here today for follow up for milk expression continuing after ceasing breastfeeding. Stopped breast feeding 1 year ago, she is expressing milk in shower wherever she feels pressure in the breasts, not daily but a few times a week. Milk expression has been white until recently when her right breast started expressing pink and then rust colored milk. No trauma to breast, no pain, no medication changes.   Past Medical History:  Diagnosis Date  . Anemia   . Anxiety   . Asthma    exercise induced  . Complication of anesthesia   . Head injury, closed    scalp laceration, dizzy for a few minutes  . History of chlamydia   . PONV (postoperative nausea and vomiting)   . Von Willebrand disease (HCC)     Past Surgical History:  Procedure Laterality Date  . HAND SURGERY Left 2009   thumb , fracture  . HAND SURGERY Left 2006   Pins placed  . HARDWARE REMOVAL Left 02/10/2016   Procedure: LEFT SMALL FINGER DEEP IMPLANT REMOVAL;  Surgeon: Bradly Bienenstock, MD;  Location: [facility] OR;  Service: Orthopedics;  Laterality: Left;  . OPEN REDUCTION INTERNAL FIXATION (ORIF) PROXIMAL PHALANX Left 01/06/2016   Procedure: OPEN REDUCTION INTERNAL FIXATION (ORIF) LEFT SMALL FINGER;  Surgeon: Bradly Bienenstock, MD;  Location: [facility] OR;  Service: Orthopedics;  Laterality: Left;     Current Outpatient Medications:  .  acetaminophen (TYLENOL) 325 MG tablet, Take 650 mg by mouth every 6 (six) hours as needed for mild pain or headache. , Disp: , Rfl:  .  albuterol (VENTOLIN HFA) 108 (90 Base) MCG/ACT inhaler, Inhale 1-2 puffs into the lungs every 4 (four) hours as needed for wheezing or shortness of breath., Disp: 18 g, Rfl: 2 .  EPINEPHrine 0.3 mg/0.3 mL IJ SOAJ injection, Inject 0.3 mLs (0.3 mg total) into the muscle once as needed for up to 1 dose. As needed for bee stings, Disp: 1 each, Rfl: 2 .  cetirizine (ZYRTEC) 10 MG tablet, Take 1 tablet (10 mg total) by mouth  daily. (Patient not taking: Reported on 11/25/2019), Disp: 30 tablet, Rfl: 0 .  Prenatal Vit-Fe Fumarate-FA (PRENATAL MULTIVITAMIN) TABS tablet, Take 1 tablet by mouth at bedtime. (Patient not taking: Reported on 07/15/2020), Disp: , Rfl:   The following portions of the patient's history were reviewed and updated as appropriate: allergies, current medications, past family history, past medical history, past social history, past surgical history and problem list.   Review of Systems:  Pertinent items noted in HPI and remainder of comprehensive ROS otherwise negative.   Objective:  Physical Exam BP 106/64   Pulse 68   Wt 215 lb 4.8 oz (97.7 kg)   LMP 07/13/2020   BMI 38.14 kg/m  CONSTITUTIONAL: Well-developed, well-nourished female in no acute distress.  HENT:  Normocephalic, atraumatic. External right and left ear normal. Oropharynx is clear and moist EYES: Conjunctivae and EOM are normal. Pupils are equal, round, and reactive to light. No scleral icterus.  NECK: Normal range of motion, supple, no masses SKIN: Skin is warm and dry. No rash noted. Not diaphoretic. No erythema. No pallor. NEUROLOGIC: Alert and oriented to person, place, and time. Normal reflexes, muscle tone coordination. No cranial nerve deficit noted. PSYCHIATRIC: Normal mood and affect. Normal behavior. Normal judgment and thought content. CARDIOVASCULAR: Normal heart rate noted RESPIRATORY: Effort normal, no problems with respiration noted BREASTS: Symmetric in size. No masses, skin changes,  nipple drainage, or lymphadenopathy. Unable to express milk today ABDOMEN: Soft, no distention noted.   PELVIC: deferred MUSCULOSKELETAL: Normal range of motion. No edema noted.  Exam done with chaperone present.  Labs and Imaging US BREAST LTD UNI LEFT INC AXILLA  Result Date: 07/06/2020 CLINICAL DATA:  29 year old female presenting with left milky nipple discharge when expressed one year after cessation from breast feeding.  EXAM: ULTRASOUND OF THE LEFT BREAST COMPARISON:  None. FINDINGS: On physical exam, the patient is able to express milky discharge arising from several duct orifices. No bloody discharge identified. Targeted ultrasound is performed in the retroareolar left breast demonstrating no suspicious cystic or solid mass. No dilated duct or fluid collection. IMPRESSION: No sonographic evidence of malignancy or other abnormality in the retroareolar left breast. Patient has expressed milky left nipple discharge which is not a pathologic type of discharge. RECOMMENDATION: 1. Clinical follow-up as needed for the left discharge. Consider evaluation for galactorrhea. 2.  Begin routine annual screening mammography at age 18. I have discussed the findings and recommendations with the patient. If applicable, a reminder letter will be sent to the patient regarding the next appointment. BI-RADS CATEGORY  2: Benign. Electronically Signed   By: Emmaline Kluver M.D.   On: 07/06/2020 16:21   US BREAST LTD UNI RIGHT INC AXILLA  Result Date: 07/13/2020 CLINICAL DATA:  29 year old female presenting with bilateral milky discharge 1 year after cessation from breast feeding. Additionally she occasionally notes a rusty brown color discharge from the right breast when expressed. EXAM: ULTRASOUND OF THE RIGHT BREAST COMPARISON:  None. FINDINGS: On physical exam, the patient is able to express milky discharge from several duct orifices. No brownish or bloody discharge is noted on exam today. There are no nipple skin changes. Targeted ultrasound is performed throughout the retroareolar aspect of the right breast demonstrating no cystic or solid mass. No dilated duct or fluid collection. IMPRESSION: No sonographic evidence of malignancy or other finding to explain the patient's nipple discharge. The discharge is predominantly milky and there is occasionally brown discharge only when expressed, which is most likely physiologic. RECOMMENDATION: 1.  Clinical follow-up as needed for the nipple discharge. Consider evaluation for galactorrhea. Patient was counseled to return for evaluation should she notice new spontaneous clear or bloody discharge. 2.  Begin routine annual screening mammography at age 4. I have discussed the findings and recommendations with the patient. If applicable, a reminder letter will be sent to the patient regarding the next appointment. BI-RADS CATEGORY  2: Benign. Electronically Signed   By: Emmaline Kluver M.D.   On: 07/13/2020 11:07    Assessment & Plan:   1. Inappropriate secretion of milk - Likely physiologic related to ongoing expression of milk after ceasing breast feeding 1 year ago - advised patient on stopping expression as much as she can, only to be done for comfort purposes - she is mostly concerned about rust color, reassured her with normal ultrasound, rust/pink colored milk is normal - will get labs to ensure no other process, she is agreeable - return with worsening symptoms - Prolactin - TSH   Routine preventative health maintenance measures emphasized. Please refer to After Visit Summary for other counseling recommendations.   Return if symptoms worsen or fail to improve, for no follow up needed at this time.  Total face-to-face time with patient: 16 minutes. Over 50% of encounter was spent on counseling and coordination of care.  Baldemar Lenis, M.D. Attending [facility])

## 2020-07-16 LAB — PROLACTIN: Prolactin: 10.9 ng/mL (ref 4.8–23.3)

## 2020-07-16 LAB — TSH: TSH: 1.65 u[IU]/mL (ref 0.450–4.500)

## 2020-12-26 ENCOUNTER — Encounter (HOSPITAL_BASED_OUTPATIENT_CLINIC_OR_DEPARTMENT_OTHER): Payer: Self-pay

## 2020-12-26 ENCOUNTER — Emergency Department (HOSPITAL_BASED_OUTPATIENT_CLINIC_OR_DEPARTMENT_OTHER): Payer: Medicaid Other | Admitting: Radiology

## 2020-12-26 ENCOUNTER — Emergency Department (HOSPITAL_BASED_OUTPATIENT_CLINIC_OR_DEPARTMENT_OTHER)
Admission: EM | Admit: 2020-12-26 | Discharge: 2020-12-26 | Disposition: A | Discharge status: Home / Self Care | Payer: Medicaid Other | Attending: Emergency Medicine | Admitting: Emergency Medicine

## 2020-12-26 ENCOUNTER — Other Ambulatory Visit: Payer: Self-pay

## 2020-12-26 ENCOUNTER — Ambulatory Visit
Admission: EM | Admit: 2020-12-26 | Discharge: 2020-12-26 | Discharge status: Against Medical Advice | Payer: Medicaid Other

## 2020-12-26 DIAGNOSIS — S8001XA Contusion of right knee, initial encounter: Secondary | ICD-10-CM | POA: Diagnosis not present

## 2020-12-26 DIAGNOSIS — Y9362 Activity, american flag or touch football: Secondary | ICD-10-CM | POA: Diagnosis not present

## 2020-12-26 DIAGNOSIS — W500XXA Accidental hit or strike by another person, initial encounter: Secondary | ICD-10-CM | POA: Insufficient documentation

## 2020-12-26 DIAGNOSIS — J45909 Unspecified asthma, uncomplicated: Secondary | ICD-10-CM | POA: Insufficient documentation

## 2020-12-26 DIAGNOSIS — S8991XA Unspecified injury of right lower leg, initial encounter: Secondary | ICD-10-CM | POA: Diagnosis present

## 2020-12-26 DIAGNOSIS — Y92321 Football field as the place of occurrence of the external cause: Secondary | ICD-10-CM | POA: Diagnosis not present

## 2020-12-26 MED ORDER — IBUPROFEN 400 MG PO TABS
400.0000 mg | ORAL_TABLET | Freq: Once | ORAL | Status: AC
  Administered 2020-12-26: 400 mg via ORAL
  Filled 2020-12-26: qty 1

## 2020-12-26 NOTE — ED Notes (Signed)
Bosie Clos, EMT applied ice to the right knee

## 2020-12-26 NOTE — ED Notes (Signed)
Per Patient [facility]: pt left due to being out of network for insurance.

## 2020-12-26 NOTE — ED Provider Notes (Signed)
[facility] EMERGENCY DEPT Provider Note   CSN: [id number] Arrival date & time: 12/26/20  1727     History Chief Complaint  Patient presents with  . Knee Pain    [name] is a 30 y.o. female.  The history is provided by the patient.  Knee Pain  [name] is a 30 y.o. female who presents to the Emergency Department complaining of knee pain. She presents the emergency department complaining of right knee injury that occurred while playing flag football. She was running down the field when another teammate ran into her knee. She complains of severe pain to the right anterior knee. Pain is worse with walking and bending. No additional injuries.    Past Medical History:  Diagnosis Date  . Anemia   . Anxiety   . Asthma    exercise induced  . Complication of anesthesia   . Head injury, closed    scalp laceration, dizzy for a few minutes  . History of chlamydia   . PONV (postoperative nausea and vomiting)   . Von Willebrand disease [facility])     Patient Active Problem List   Diagnosis Date Noted  . BMI 36.0-36.9,adult 11/25/2019  . Varicose veins of both lower extremities with pain 11/25/2019  . Stress incontinence in female 11/25/2019  . Influenza B 09/14/2018  . Von Willebrand disease (HCC) 12/31/2016  . History of multiple miscarriages 12/31/2016    Past Surgical History:  Procedure Laterality Date  . HAND SURGERY Left 2009   thumb , fracture  . HAND SURGERY Left 2006   Pins placed  . HARDWARE REMOVAL Left 02/10/2016   Procedure: LEFT SMALL FINGER DEEP IMPLANT REMOVAL;  Surgeon: Bradly Bienenstock, MD;  Location: [facility] OR;  Service: Orthopedics;  Laterality: Left;  . OPEN REDUCTION INTERNAL FIXATION (ORIF) PROXIMAL PHALANX Left 01/06/2016   Procedure: OPEN REDUCTION INTERNAL FIXATION (ORIF) LEFT SMALL FINGER;  Surgeon: Bradly Bienenstock, MD;  Location: [facility] OR;  Service: Orthopedics;  Laterality: Left;     OB History    Gravida  3   Para      Term       Preterm      AB  2   Living  0     SAB  2   IAB      Ectopic      Multiple      Live Births              Family History  Problem Relation Age of Onset  . Diabetes Father   . Hypertension Father   . Hypercholesterolemia Father   . Heart disease Father   . Cancer Paternal Grandfather   . Cancer Paternal Grandmother     Social History   Tobacco Use  . Smoking status: Never Smoker  . Smokeless tobacco: Never Used  Vaping Use  . Vaping Use: Never used  Substance Use Topics  . Alcohol use: Not Currently    Comment: Currently no drinking due to pregnancy  . Drug use: No    Home Medications Prior to Admission medications   Medication Sig Start Date End Date Taking? Authorizing Provider  acetaminophen (TYLENOL) 325 MG tablet Take 650 mg by mouth every 6 (six) hours as needed for mild pain or headache.     [provider]  albuterol (VENTOLIN HFA) 108 (90 Base) MCG/ACT inhaler Inhale 1-2 puffs into the lungs every 4 (four) hours as needed for wheezing or shortness of breath. 11/25/19   Burleson, Brand Males,  NP  cetirizine (ZYRTEC) 10 MG tablet Take 1 tablet (10 mg total) by mouth daily. Patient not taking: No sig reported 10/28/19   Alvino Chapel, [place], PA-C  EPINEPHrine 0.3 mg/0.3 mL IJ SOAJ injection Inject 0.3 mLs (0.3 mg total) into the muscle once as needed for up to 1 dose. As needed for bee stings 11/25/19   Currie Paris, NP  Prenatal Vit-Fe Fumarate-FA (PRENATAL MULTIVITAMIN) TABS tablet Take 1 tablet by mouth at bedtime. Patient not taking: No sig reported    [provider]    Allergies    Bee venom and Adhesive [tape]  Review of Systems   Review of Systems  All other systems reviewed and are negative.   Physical Exam Updated Vital Signs BP 131/80 (BP Location: Right Wrist)   Pulse 92   Temp 98.5 F (36.9 C) (Oral)   Resp 16   Ht 5\' 3"  (1.6 m)   Wt 94.3 kg   LMP 11/28/2020   SpO2 97%   BMI 36.85 kg/m   Physical Exam Vitals  and nursing note reviewed.  Constitutional:      Appearance: She is well-developed.  HENT:     Head: Normocephalic and atraumatic.  Cardiovascular:     Rate and Rhythm: Normal rate and regular rhythm.  Pulmonary:     Effort: Pulmonary effort is normal. No respiratory distress.  Musculoskeletal:     Comments: Ecchymosis and edema to the right anterior knee overlying the patella. There is local tenderness in this area. There is also mild medial joint line tenderness over the right knee. She is able to flex the knee and extend. 2+ DP pulses bilaterally.  Skin:    General: Skin is warm and dry.  Neurological:     Mental Status: She is alert and oriented to person, place, and time.  Psychiatric:        Behavior: Behavior normal.     ED Results / Procedures / Treatments   Labs (all labs ordered are listed, but only abnormal results are displayed) Labs Reviewed - No data to display  EKG None  Radiology DG Knee Complete 4 Views Right  Result Date: 12/26/2020 CLINICAL DATA:  Pt reports hit right knee while at flag footbal practice. EXAM: RIGHT KNEE - COMPLETE 4+ VIEW COMPARISON:  None. FINDINGS: No evidence of fracture, dislocation, or joint effusion. No evidence of arthropathy or other focal bone abnormality. Soft tissues are unremarkable. IMPRESSION: Negative. Electronically Signed   By: 12/28/2020 M.D.   On: 12/26/2020 18:08    Procedures Procedures   Medications Ordered in ED Medications  ibuprofen (ADVIL) tablet 400 mg (has no administration in time range)    ED Course  I have reviewed the triage vital signs and the nursing notes.  Pertinent labs & imaging results that were available during my care of the patient were reviewed by me and considered in my medical decision making (see chart for details).    MDM Rules/Calculators/A&P                         patient here for evaluation of right knee injury while playing flag football. She has swelling, tenderness and  ecchymosis over the anterior knee. There is no ligamentous laxity on bedside testing. Imaging is negative for acute fracture. Will place the knee sleeve with weight-bearing as tolerated. Discussed orthopedics follow-up if she does not continue to improve over the next week.  Recommend ibuprofen and tylenol according to label  instructions for pain.   Final Clinical Impression(s) / ED Diagnoses Final diagnoses:  Contusion of right knee, initial encounter    Rx / DC Orders ED Discharge Orders    None       Tilden Fossa, MD 12/26/20 1831

## 2020-12-26 NOTE — ED Triage Notes (Signed)
Pt reports hit right knee while at flag footbal practice.

## 2020-12-27 ENCOUNTER — Ambulatory Visit (HOSPITAL_COMMUNITY): Payer: Self-pay

## 2021-01-06 ENCOUNTER — Ambulatory Visit (INDEPENDENT_AMBULATORY_CARE_PROVIDER_SITE_OTHER): Payer: Medicaid Other | Admitting: Orthopaedic Surgery

## 2021-01-06 ENCOUNTER — Other Ambulatory Visit: Payer: Self-pay

## 2021-01-06 ENCOUNTER — Encounter: Payer: Self-pay | Admitting: Orthopaedic Surgery

## 2021-01-06 DIAGNOSIS — S8001XA Contusion of right knee, initial encounter: Secondary | ICD-10-CM | POA: Diagnosis not present

## 2021-01-06 NOTE — Progress Notes (Signed)
Office Visit Note   Patient: [name]           Date of Birth: [birth year]           MRN: [id number] Visit Date: 01/06/2021              Requested by: [facility] [address] [place],  [place] [id number] PCP: [facility]   Assessment & Plan: Visit Diagnoses:  1. Contusion of right knee, initial encounter     Plan: Conservative treatment recommended we discussed topical creams such as Aspercreme.  Intermittent ice.  Gradual weightbearing as tolerated.  She can return if she has persistent symptoms.  Follow-Up Instructions: No follow-ups on file.   Orders:  No orders of the defined types were placed in this encounter.  No orders of the defined types were placed in this encounter.     Procedures: No procedures performed   Clinical Data: No additional findings.   Subjective: Chief Complaint  Patient presents with  . Right Knee - Pain    HPI 30 year old female was playing flag football which she does on weekends running down the field teammate ran into her knee.  [facility] placed in a knee sleeve weightbearing as tolerated.  X-rays negative for fracture she has had stiffness and has had pain at the superior pole of the patella where there is a small area of ecchymosis.  She is able to straight leg raise.  She denies groin pain no numbness or tingling in her feet.  No previous history of knee surgery.  Review of Systems positive for von Willebrand's disease.  All other systems noncontributory to HPI.   Objective: Vital Signs: BP 99/63   Pulse 71   Physical Exam Constitutional:      Appearance: She is well-developed.  HENT:     Head: Normocephalic.     Right Ear: External ear normal.     Left Ear: External ear normal.  Eyes:     Pupils: Pupils are equal, round, and reactive to light.  Neck:     Thyroid: No thyromegaly.     Trachea: No tracheal deviation.   Cardiovascular:     Rate and Rhythm: Normal rate.  Pulmonary:     Effort: Pulmonary effort is normal.  Abdominal:     Palpations: Abdomen is soft.  Skin:    General: Skin is warm and dry.  Neurological:     Mental Status: She is alert and oriented to person, place, and time.  Psychiatric:        Behavior: Behavior normal.     Ortho Exam negative logroll the hips.  Some tenderness superior pole the patella slight swelling in slight ecchymosis no subcutaneous contusion.  Prepatellar bursa midportion of patella distal is not enlarged.  Collateral ligaments are stable ACL PCL exam is normal.  Knee reaches full extension she can do straight leg raise.  Tenderness palpation directly over the superior pole of the patella and inferior quad tendon without palpable defect. Specialty Comments:  No specialty comments available.  Imaging: No results found.   PMFS History: Patient Active Problem List   Diagnosis Date Noted  . Contusion of right knee 01/11/2021  . BMI 36.0-36.9,adult 11/25/2019  . Varicose veins of both lower extremities with pain 11/25/2019  . Stress incontinence in female 11/25/2019  . Influenza B 09/14/2018  . Von Willebrand disease (HCC) 12/31/2016  . History of multiple  miscarriages 12/31/2016   Past Medical History:  Diagnosis Date  . Anemia   . Anxiety   . Asthma    exercise induced  . Complication of anesthesia   . Head injury, closed    scalp laceration, dizzy for a few minutes  . History of chlamydia   . PONV (postoperative nausea and vomiting)   . Von Willebrand disease (HCC)     Family History  Problem Relation Age of Onset  . Diabetes Father   . Hypertension Father   . Hypercholesterolemia Father   . Heart disease Father   . Cancer Paternal Grandfather   . Cancer Paternal Grandmother     Past Surgical History:  Procedure Laterality Date  . HAND SURGERY Left 2009   thumb , fracture  . HAND SURGERY Left 2006   Pins placed  . HARDWARE  REMOVAL Left 02/10/2016   Procedure: LEFT SMALL FINGER DEEP IMPLANT REMOVAL;  Surgeon: Bradly Bienenstock, MD;  Location: [facility] OR;  Service: Orthopedics;  Laterality: Left;  . OPEN REDUCTION INTERNAL FIXATION (ORIF) PROXIMAL PHALANX Left 01/06/2016   Procedure: OPEN REDUCTION INTERNAL FIXATION (ORIF) LEFT SMALL FINGER;  Surgeon: Bradly Bienenstock, MD;  Location: [facility] OR;  Service: Orthopedics;  Laterality: Left;   Social History   Occupational History  . Not on file  Tobacco Use  . Smoking status: Never Smoker  . Smokeless tobacco: Never Used  Vaping Use  . Vaping Use: Never used  Substance and Sexual Activity  . Alcohol use: Not Currently    Comment: Currently no drinking due to pregnancy  . Drug use: No  . Sexual activity: Not Currently    Birth control/protection: None

## 2021-01-11 DIAGNOSIS — S8001XA Contusion of right knee, initial encounter: Secondary | ICD-10-CM | POA: Insufficient documentation

## 2021-02-05 ENCOUNTER — Emergency Department (HOSPITAL_BASED_OUTPATIENT_CLINIC_OR_DEPARTMENT_OTHER)
Admission: EM | Admit: 2021-02-05 | Discharge: 2021-02-05 | Disposition: A | Discharge status: Home / Self Care | Payer: Medicaid Other | Attending: Emergency Medicine | Admitting: Emergency Medicine

## 2021-02-05 ENCOUNTER — Other Ambulatory Visit: Payer: Self-pay

## 2021-02-05 ENCOUNTER — Emergency Department (HOSPITAL_BASED_OUTPATIENT_CLINIC_OR_DEPARTMENT_OTHER): Payer: Medicaid Other | Admitting: Radiology

## 2021-02-05 ENCOUNTER — Encounter (HOSPITAL_BASED_OUTPATIENT_CLINIC_OR_DEPARTMENT_OTHER): Payer: Self-pay

## 2021-02-05 DIAGNOSIS — Y9362 Activity, american flag or touch football: Secondary | ICD-10-CM | POA: Diagnosis not present

## 2021-02-05 DIAGNOSIS — S8992XA Unspecified injury of left lower leg, initial encounter: Secondary | ICD-10-CM | POA: Diagnosis present

## 2021-02-05 DIAGNOSIS — W500XXA Accidental hit or strike by another person, initial encounter: Secondary | ICD-10-CM | POA: Insufficient documentation

## 2021-02-05 DIAGNOSIS — S8012XA Contusion of left lower leg, initial encounter: Secondary | ICD-10-CM | POA: Insufficient documentation

## 2021-02-05 DIAGNOSIS — J45909 Unspecified asthma, uncomplicated: Secondary | ICD-10-CM | POA: Diagnosis not present

## 2021-02-05 MED ORDER — KETOROLAC TROMETHAMINE 60 MG/2ML IM SOLN: 15.0000 mg | Freq: Once | INTRAMUSCULAR | Status: DC

## 2021-02-05 MED ORDER — KETOROLAC TROMETHAMINE 60 MG/2ML IM SOLN
30.0000 mg | Freq: Once | INTRAMUSCULAR | Status: DC
  Filled 2021-02-05: qty 2

## 2021-02-05 NOTE — ED Notes (Signed)
Pt verbalizes understanding of discharge instructions. Opportunity for questioning and answers were provided. Armand removed by staff, pt discharged from ED to home. Pt refused Toradol. Ice pak given to take home and ACE bandage.

## 2021-02-05 NOTE — ED Provider Notes (Signed)
[facility]  CSN: [id number] Arrival date & time: 02/05/21 2055  Chief Complaint(s) Leg Injury (Left)  HPI Victoria CulverRachael Nguyen is a 30 y.o. female here for left leg injury.  Patient was playing flag football when [name] was hit in the anterior portion of [name] left leg.  Patient felt immediate throbbing pain to the anterior midportion of the leg.  Pain is moderate to severe nature.  Pain worse with ambulation.  [name] denies any other injuries or physical complaints at this time.  HPI  Past Medical History Past Medical History:  Diagnosis Date  . Anemia   . Anxiety   . Asthma    exercise induced  . Complication of anesthesia   . Head injury, closed    scalp laceration, dizzy for a few minutes  . History of chlamydia   . PONV (postoperative nausea and vomiting)   . Von Willebrand disease [facility](HCC)    Patient Active Problem List   Diagnosis Date Noted  . Contusion of right knee 01/11/2021  . BMI 36.0-36.9,adult 11/25/2019  . Varicose veins of both lower extremities with pain 11/25/2019  . Stress incontinence in female 11/25/2019  . Influenza B 09/14/2018  . Von Willebrand disease (HCC) 12/31/2016  . History of multiple miscarriages 12/31/2016   Home Medication(s) Prior to Admission medications   Medication Sig Start Date End Date Taking? Authorizing Provider  acetaminophen (TYLENOL) 325 MG tablet Take 650 mg by mouth every 6 (six) hours as needed for mild pain or headache.     [provider]  albuterol (VENTOLIN HFA) 108 (90 Base) MCG/ACT inhaler Inhale 1-2 puffs into the lungs every 4 (four) hours as needed for wheezing or shortness of breath. 11/25/19   Burleson, Brand Maleserri L, NP  cetirizine (ZYRTEC) 10 MG tablet Take 1 tablet (10 mg total) by mouth daily. Patient not taking: No sig reported 10/28/19   Alvino ChapelWurst, [place], PA-C  EPINEPHrine 0.3 mg/0.3 mL IJ SOAJ injection Inject 0.3 mLs (0.3 mg total) into the muscle once as needed for up to 1 dose.  As needed for bee stings 11/25/19   Currie ParisBurleson, Terri L, NP  Prenatal Vit-Fe Fumarate-FA (PRENATAL MULTIVITAMIN) TABS tablet Take 1 tablet by mouth at bedtime. Patient not taking: No sig reported    [provider]                                                                                                                                    Past Surgical History Past Surgical History:  Procedure Laterality Date  . HAND SURGERY Left 2009   thumb , fracture  . HAND SURGERY Left 2006   Pins placed  . HARDWARE REMOVAL Left 02/10/2016   Procedure: LEFT SMALL FINGER DEEP IMPLANT REMOVAL;  Surgeon: Bradly BienenstockFred Ortmann, MD;  Location: [facility] OR;  Service: Orthopedics;  Laterality: Left;  . OPEN REDUCTION INTERNAL FIXATION (ORIF) PROXIMAL PHALANX Left 01/06/2016   Procedure:  OPEN REDUCTION INTERNAL FIXATION (ORIF) LEFT SMALL FINGER;  Surgeon: Bradly Bienenstock, MD;  Location: [facility] OR;  Service: Orthopedics;  Laterality: Left;   Family History Family History  Problem Relation Age of Onset  . Diabetes Father   . Hypertension Father   . Hypercholesterolemia Father   . Heart disease Father   . Cancer Paternal Grandfather   . Cancer Paternal Grandmother     Social History Social History   Tobacco Use  . Smoking status: Never Smoker  . Smokeless tobacco: Never Used  Vaping Use  . Vaping Use: Never used  Substance Use Topics  . Alcohol use: Not Currently    Comment: Currently no drinking due to pregnancy  . Drug use: No   Allergies Bee venom and Adhesive [tape]  Review of Systems Review of Systems All other systems are reviewed and are negative for acute change except as noted in the HPI  Physical Exam Vital Signs  I have reviewed the triage vital signs BP 115/74 (BP Location: Right Arm)   Pulse 90   Temp 98.3 F (36.8 C)   Resp 16   Ht 5\' 3"  (1.6 m)   Wt 95.3 kg   SpO2 98%   BMI 37.20 kg/m   Physical Exam Vitals reviewed.  Constitutional:      General: [name] is not in acute  distress.    Appearance: [name] is well-developed. [name] is not diaphoretic.  HENT:     Head: Normocephalic and atraumatic.     Right Ear: External ear normal.     Left Ear: External ear normal.     Nose: Nose normal.  Eyes:     General: No scleral icterus.    Conjunctiva/sclera: Conjunctivae normal.  Neck:     Trachea: Phonation normal.  Cardiovascular:     Rate and Rhythm: Normal rate and regular rhythm.  Pulmonary:     Effort: Pulmonary effort is normal. No respiratory distress.     Breath sounds: No stridor.  Abdominal:     General: There is no distension.  Musculoskeletal:        General: Normal range of motion.     Cervical back: Normal range of motion.     Left lower leg: Tenderness present. No swelling or deformity. No edema.     Left foot: Normal capillary refill. Normal pulse.       Legs:     Comments: Compartments soft  Neurological:     Mental Status: [name] is alert and oriented to person, place, and time.  Psychiatric:        Behavior: Behavior normal.     ED Results and Treatments Labs (all labs ordered are listed, but only abnormal results are displayed) Labs Reviewed - No data to display                                                                                                                       EKG  EKG Interpretation  Date/Time:    Ventricular  Rate:    PR Interval:    QRS Duration:   QT Interval:    QTC Calculation:   R Axis:     Text Interpretation:        Radiology DG Tibia/Fibula Left  Result Date: 02/05/2021 CLINICAL DATA:  Collision during flag football, mid tibia/fibula pain EXAM: LEFT TIBIA AND FIBULA - 2 VIEW COMPARISON:  None. FINDINGS: Minimal swelling anterior to the mid shaft tibia. No soft tissue gas or foreign body. There is no evidence of fracture or other focal bone lesions. Alignment at the knee and ankle is grossly preserved. IMPRESSION: Minimal soft tissue swelling anterior to the mid shaft tibia. No acute osseous  abnormality. Electronically Signed   By: Kreg Shropshire M.D.   On: 02/05/2021 22:19    Pertinent labs & imaging results that were available during my care of the patient were reviewed by me and considered in my medical decision making (see chart for details).  Medications Ordered in ED Medications  ketorolac (TORADOL) injection 15 mg (has no administration in time range)                                                                                                                                    Procedures Procedures  (including critical care time)  Medical Decision Making / ED Course I have reviewed the nursing notes for this encounter and the patient's prior records (if available in EHR or on provided paperwork).   Shirel Mallis was evaluated in Emergency Department on 02/05/2021 for the symptoms described in the history of present illness. [name] was evaluated in the context of the global COVID-19 pandemic, which necessitated consideration that the patient might be at risk for infection with the SARS-CoV-2 virus that causes COVID-19. Institutional protocols and algorithms that pertain to the evaluation of patients at risk for COVID-19 are in a state of rapid change based on information released by regulatory bodies including the CDC and federal and state organizations. These policies and algorithms were followed during the patient's care in the ED.  Plain film negative for acute fracture or dislocation. Soft tissue contusion to the anterior left lower leg. Compartments soft and pulses intact.  No concern for compartment syndrome. On review of record, patient has documented history of von Willebrand's disease but patient reports [name] was never formally diagnosed.  [name] reports that there was a possible concern when [name] was in high school and college due to easy bruising and followed up with a hematologist but never had a formal diagnosis. [name] reports that [name] takes NSAIDs and has never had any  issues.  Recommend symptomatic and supportive management with over-the-counter pain medicine with RICE.      Final Clinical Impression(s) / ED Diagnoses Final diagnoses:  Contusion of left lower extremity, initial encounter    The patient appears reasonably screened and/or stabilized for discharge and I doubt any other medical condition or other [facility]  requiring further screening, evaluation, or treatment in the ED at this time prior to discharge. Safe for discharge with strict return precautions.  Disposition: Discharge  Condition: Good  I have discussed the results, Dx and Tx plan with the patient/family who expressed understanding and agree(s) with the plan. Discharge instructions discussed at length. The patient/family was given strict return precautions who verbalized understanding of the instructions. No further questions at time of discharge.    ED Discharge Orders    None       Follow Up: Primary care provider  Schedule an appointment as soon as possible for a visit  contact [facility] at [phone] for referral, if you have not been called about your appointment     This chart was dictated using voice recognition software.  Despite best efforts to proofread,  errors can occur which can change the documentation meaning.   Nira Conn, MD 02/05/21 2320

## 2021-02-05 NOTE — ED Triage Notes (Signed)
Patient here POV from Home with Leg Injury.  Patient was playing [employer] when another Player collided with her leg. Patient believes the players knee collided with with her Leg.   Left Lower Leg was affected. Currently ace wrapped by Patient. Ambulatory but Painful.

## 2022-04-30 ENCOUNTER — Emergency Department (HOSPITAL_COMMUNITY)
Admission: EM | Admit: 2022-04-30 | Discharge: 2022-04-30 | Disposition: A | Discharge status: Home / Self Care | Payer: Medicaid Other | Attending: Emergency Medicine | Admitting: Emergency Medicine

## 2022-04-30 ENCOUNTER — Other Ambulatory Visit: Payer: Self-pay

## 2022-04-30 ENCOUNTER — Encounter (HOSPITAL_COMMUNITY): Payer: Self-pay | Admitting: Emergency Medicine

## 2022-04-30 DIAGNOSIS — T7840XA Allergy, unspecified, initial encounter: Secondary | ICD-10-CM | POA: Diagnosis present

## 2022-04-30 DIAGNOSIS — R0789 Other chest pain: Secondary | ICD-10-CM | POA: Insufficient documentation

## 2022-04-30 DIAGNOSIS — T782XXA Anaphylactic shock, unspecified, initial encounter: Secondary | ICD-10-CM | POA: Diagnosis not present

## 2022-04-30 MED ORDER — EPINEPHRINE 0.3 MG/0.3ML IJ SOAJ
0.3000 mg | Freq: Once | INTRAMUSCULAR | Status: AC
  Administered 2022-04-30: 0.3 mg via INTRAMUSCULAR

## 2022-04-30 MED ORDER — ALBUTEROL SULFATE HFA 108 (90 BASE) MCG/ACT IN AERS: 2.0000 | INHALATION_SPRAY | RESPIRATORY_TRACT | 0 refills | Status: DC | PRN

## 2022-04-30 MED ORDER — EPINEPHRINE 0.3 MG/0.3ML IJ SOAJ
INTRAMUSCULAR | Status: AC
  Filled 2022-04-30: qty 0.3

## 2022-04-30 MED ORDER — FAMOTIDINE IN NACL 20-0.9 MG/50ML-% IV SOLN
20.0000 mg | Freq: Once | INTRAVENOUS | Status: AC
  Administered 2022-04-30: 20 mg via INTRAVENOUS
  Filled 2022-04-30: qty 50

## 2022-04-30 MED ORDER — DIPHENHYDRAMINE HCL 50 MG/ML IJ SOLN
25.0000 mg | Freq: Once | INTRAMUSCULAR | Status: AC
  Administered 2022-04-30: 25 mg via INTRAVENOUS
  Filled 2022-04-30: qty 1

## 2022-04-30 MED ORDER — EPINEPHRINE 0.3 MG/0.3ML IJ SOAJ: 0.3000 mg | INTRAMUSCULAR | 0 refills | Status: DC | PRN

## 2022-04-30 NOTE — Discharge Instructions (Addendum)
To treat the ongoing symptoms and prevent worsening situation, take Claritin 1 a day for 3 or 4 days and famotidine 20 mg twice a day for 3 to 4 days.  We sent prescriptions for albuterol and epinephrine to your pharmacy.  Return here if needed.

## 2022-04-30 NOTE — ED Triage Notes (Signed)
Patient reports bee sting to R wrist with known allergy.

## 2022-04-30 NOTE — ED Provider Notes (Signed)
[facility]-EMERGENCY DEPT Provider Note   CSN: [id number] Arrival date & time: 04/30/22  1229     History  Chief Complaint  Patient presents with   Allergic Reaction    [name] is a 31 y.o. female.  HPI She presents for evaluation of bee sting with concern for progressive allergic reaction which she has previously had.  She states that if she had a EpiPen, she would have used it.  She arrives by private vehicle for evaluation.  She feels a tight sensation in her chest but denies shortness of breath, dizziness or weakness.    Home Medications Prior to Admission medications   Medication Sig Start Date End Date Taking? Authorizing Provider  albuterol (VENTOLIN HFA) 108 (90 Base) MCG/ACT inhaler Inhale 1-2 puffs into the lungs every 4 (four) hours as needed for wheezing or shortness of breath. Patient taking differently: Inhale 2 puffs into the lungs as needed for wheezing or shortness of breath. 11/25/19  Yes Burleson, Brand Males, NP  albuterol (VENTOLIN HFA) 108 (90 Base) MCG/ACT inhaler Inhale 2 puffs into the lungs every 4 (four) hours as needed for wheezing or shortness of breath. 04/30/22  Yes Mancel Bale, MD  EPINEPHrine 0.3 mg/0.3 mL IJ SOAJ injection Inject 0.3 mg into the muscle as needed for anaphylaxis. 04/30/22  Yes Mancel Bale, MD  ibuprofen (ADVIL) 200 MG tablet Take 400 mg by mouth 3 (three) times daily as needed for cramping.   Yes [provider]  naproxen sodium (ALEVE) 220 MG tablet Take 220-440 mg by mouth 3 (three) times daily as needed (cramps).   Yes [provider]  cetirizine (ZYRTEC) 10 MG tablet Take 1 tablet (10 mg total) by mouth daily. Patient not taking: Reported on 11/25/2019 10/28/19   Alvino Chapel, [place], PA-C  EPINEPHrine 0.3 mg/0.3 mL IJ SOAJ injection Inject 0.3 mLs (0.3 mg total) into the muscle once as needed for up to 1 dose. As needed for bee stings Patient not taking: Reported on 04/30/2022 11/25/19    Currie Paris, NP      Allergies    Bee venom, Tapentadol, and Adhesive [tape]    Review of Systems   Review of Systems  Physical Exam Updated Vital Signs BP 110/69   Pulse 67   Temp 99.3 F (37.4 C) (Oral)   Resp (!) 21   Ht 5\' 3"  (1.6 m)   Wt 94.3 kg   LMP 04/16/2022 (Approximate)   SpO2 99%   BMI 36.85 kg/m  Physical Exam Vitals and nursing note reviewed.  Constitutional:      General: She is in acute distress (She is uncomfortable).     Appearance: She is well-developed. She is not ill-appearing or diaphoretic.  HENT:     Head: Normocephalic and atraumatic.     Right Ear: External ear normal.     Left Ear: External ear normal.  Eyes:     Conjunctiva/sclera: Conjunctivae normal.     Pupils: Pupils are equal, round, and reactive to light.  Neck:     Trachea: Phonation normal.  Cardiovascular:     Rate and Rhythm: Normal rate and regular rhythm.     Heart sounds: Normal heart sounds.  Pulmonary:     Effort: Pulmonary effort is normal. No respiratory distress.     Breath sounds: Normal breath sounds. No stridor. No wheezing or rhonchi.     Comments: Tachypnea present Abdominal:     Palpations: Abdomen is soft.     Tenderness: There  is no abdominal tenderness.  Musculoskeletal:        General: Normal range of motion.     Cervical back: Normal range of motion and neck supple.  Skin:    General: Skin is warm and dry.  Neurological:     Mental Status: She is alert and oriented to person, place, and time.     Cranial Nerves: No cranial nerve deficit.     Sensory: No sensory deficit.     Motor: No abnormal muscle tone.     Coordination: Coordination normal.  Psychiatric:        Mood and Affect: Mood normal.        Behavior: Behavior normal.        Thought Content: Thought content normal.        Judgment: Judgment normal.     ED Results / Procedures / Treatments   Labs (all labs ordered are listed, but only abnormal results are displayed) Labs Reviewed  - No data to display  EKG EKG Interpretation  Date/Time:  Saturday April 30 2022 12:36:54 EDT Ventricular Rate:  106 PR Interval:  141 QRS Duration: 78 QT Interval:  345 QTC Calculation: 459 R Axis:   39 Text Interpretation: Sinus tachycardia Borderline repolarization abnormality Baseline wander in lead(s) III aVF V4 since last tracing no significant change Confirmed by Mancel Bale [phone]) on 04/30/2022 12:46:49 PM  Radiology No results found.  Procedures Procedures    Medications Ordered in ED Medications  EPINEPHrine (EPI-PEN) 0.3 mg/0.3 mL injection (  Not Given 04/30/22 1244)  EPINEPHrine (EPI-PEN) injection 0.3 mg (0.3 mg Intramuscular Given 04/30/22 1230)  diphenhydrAMINE (BENADRYL) injection 25 mg (25 mg Intravenous Given 04/30/22 1243)  famotidine (PEPCID) IVPB 20 mg premix (0 mg Intravenous Stopped 04/30/22 1331)    ED Course/ Medical Decision Making/ A&P                           Medical Decision Making Patient presenting with a complaint of chest discomfort and tight feeling in her throat.  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  On arrival, she is alert and lucid.  She had not taken any medicines prior to arrival because she did not have an epinephrine pen with her.  Problems Addressed: Allergic reaction, initial encounter: chronic illness or injury    Details: Likely hymenoptera Anaphylaxis, initial encounter: acute illness or injury that poses a threat to life or bodily functions  Amount and/or Complexity of Data Reviewed Independent Historian:     Details: She is a cogent historian  Risk Prescription drug management. Decision regarding hospitalization. Risk Details: Patient presenting with recurrent symptoms of anaphylaxis, following hymenoptera envenomation.  She was uncomfortable on arrival but did not have airway at risk.  She was treated with epinephrine, H1 and H2 blockers.  She had improvement and was stable for  discharge without recurrence of her symptoms.  Doubt impending airway loss, hemodynamic collapse or acute medical illness.  She does not require hospitalization.  She requested refill on albuterol at discharge.  Advised her to continue using H1 and H2 blockade and sent prescriptions to her pharmacy.  Critical Care Total time providing critical care: 40 minutes           Final Clinical Impression(s) / ED Diagnoses Final diagnoses:  Allergic reaction, initial encounter  Anaphylaxis, initial encounter    Rx / DC Orders ED Discharge Orders  Ordered    EPINEPHrine 0.3 mg/0.3 mL IJ SOAJ injection  As needed        04/30/22 1536    albuterol (VENTOLIN HFA) 108 (90 Base) MCG/ACT inhaler  Every 4 hours PRN        04/30/22 1536              Mancel Bale, MD 04/30/22 1736

## 2022-09-26 ENCOUNTER — Other Ambulatory Visit (HOSPITAL_COMMUNITY)
Admission: RE | Admit: 2022-09-26 | Discharge: 2022-09-26 | Disposition: A | Discharge status: Home / Self Care | Payer: Medicaid Other | Source: Ambulatory Visit | Attending: Nurse Practitioner | Admitting: Nurse Practitioner

## 2022-09-26 ENCOUNTER — Encounter: Payer: Self-pay | Admitting: Nurse Practitioner

## 2022-09-26 ENCOUNTER — Ambulatory Visit: Payer: Medicaid Other | Admitting: Nurse Practitioner

## 2022-09-26 VITALS — BP 102/68 | HR 81 | Ht 64.25 in | Wt 204.0 lb

## 2022-09-26 DIAGNOSIS — Z113 Encounter for screening for infections with a predominantly sexual mode of transmission: Secondary | ICD-10-CM

## 2022-09-26 DIAGNOSIS — Z01419 Encounter for gynecological examination (general) (routine) without abnormal findings: Secondary | ICD-10-CM

## 2022-09-26 DIAGNOSIS — N939 Abnormal uterine and vaginal bleeding, unspecified: Secondary | ICD-10-CM | POA: Diagnosis not present

## 2022-09-26 DIAGNOSIS — N946 Dysmenorrhea, unspecified: Secondary | ICD-10-CM

## 2022-09-26 LAB — PREGNANCY, URINE: Preg Test, Ur: NEGATIVE

## 2022-09-26 MED ORDER — MEGESTROL ACETATE 40 MG PO TABS: 40.0000 mg | ORAL_TABLET | Freq: Every day | ORAL | 0 refills | Status: DC

## 2022-09-26 MED ORDER — IBUPROFEN 800 MG PO TABS: 800.0000 mg | ORAL_TABLET | Freq: Three times a day (TID) | ORAL | 1 refills | Status: DC | PRN

## 2022-09-26 NOTE — Progress Notes (Signed)
[name] [birth year] [id number]   History:  31 y.o. [id number] presents as new patient. Monthly cycles. LMP 09/13/2022. Bleeding started to slow after 4 days but is now heavy again like she is starting a new cycle. Normally menses last around 4-5 days. Cramping typically starts right before menses but this time there was no cramping until second episode of heavy bleeding. She has severe cramping today. H/O menorrhagia but not prolonged bleeding.  Von Willebrand disease. Sexually active, no contraception. Normal pap history.   Gynecologic History Patient's last menstrual period was 09/13/2022 (exact date).   Contraception/Family planning: none Sexually active: Yes  Health Maintenance Last Pap: 11/25/2019. Results were: Normal Last mammogram: 06/30/2020. Results were: Normal Last colonoscopy: Not indicated Last Dexa: Not indicated  Past medical history, past surgical history, family history and social history were all reviewed and documented in the EPIC chart. 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   ROS:  A ROS was performed and pertinent positives and negatives are included.  Exam:  Vitals:   09/26/22 1449  BP: 102/68  Pulse: 81  SpO2: 98%  Weight: 204 lb (92.5 kg)  Height: 5' 4.25" (1.632 m)   Body mass index is 34.74 kg/m.  General appearance:  Normal Thyroid:  Symmetrical, normal in size, without palpable masses or nodularity. Respiratory  Auscultation:  Clear without wheezing or rhonchi Cardiovascular  Auscultation:  Regular rate, without rubs, murmurs or gallops  Edema/varicosities:  Not grossly evident Abdominal  Soft,nontender, without masses, guarding or rebound.  Liver/spleen:  No organomegaly noted  Hernia:  None appreciated  Skin  Inspection:  Grossly normal Breasts: Examined lying and sitting.   Right: Without masses, retractions, nipple discharge or axillary adenopathy.   Left: Without masses, retractions, nipple discharge or axillary  adenopathy. Genitourinary   Inguinal/mons:  Normal without inguinal adenopathy  External genitalia:  Normal appearing vulva with no masses, tenderness, or lesions  BUS/Urethra/Skene's glands:  Normal  Vagina:  Normal appearing with normal color and discharge, no lesions  Cervix:  Normal appearing without discharge or lesions. Blood present  Uterus:  Normal in size, shape and contour.  Midline and mobile, mild tenderness  Adnexa/parametria:     Rt: Normal in size, without masses or tenderness.   Lt: Normal in size, without masses or tenderness.  Anus and perineum: Normal  Digital rectal exam: Not indicated  Patient informed chaperone available to be present for breast and pelvic exam. Patient has requested no chaperone to be present. Patient has been advised what will be completed during breast and pelvic exam.   UPTnegative  Assessment/Plan:  31 y.o. [id number] for annual exam.   Well female exam with routine gynecological exam - Plan: Cytology - PAP( [facility]). Education provided on SBEs, importance of preventative screenings, current guidelines, high calcium diet, regular exercise, and multivitamin daily.    Screening for STD (sexually transmitted disease) - Plan: Cytology - PAP( [facility]). GC/CT/trich added to pap. Declines HIV/RPR.   Abnormal uterine bleeding - Plan: megestrol (MEGACE) 40 MG tablet daily, Pregnancy, urine. Aware Megace is not a contraception. UPT negative. If prolonged bleeding occurs again we will schedule ultrasound.   Dysmenorrhea - Plan: ibuprofen (ADVIL) 800 MG tablet every 8 hours as needed for cramping. May also decrease bleeding by up to 40%.   Screening for cervical cancer - Normal Pap history.  Pap today.   Return in 1 year for annual or sooner if needed.     [place], 3:16  PM 09/26/2022

## 2022-10-05 LAB — CYTOLOGY - PAP
Chlamydia: NEGATIVE
Comment: NEGATIVE
Comment: NEGATIVE
Comment: NEGATIVE
Comment: NORMAL
Diagnosis: NEGATIVE
High risk HPV: NEGATIVE
Neisseria Gonorrhea: NEGATIVE
Trichomonas: NEGATIVE

## 2022-11-30 ENCOUNTER — Other Ambulatory Visit: Payer: Self-pay

## 2022-11-30 ENCOUNTER — Emergency Department (HOSPITAL_COMMUNITY)
Admission: EM | Admit: 2022-11-30 | Discharge: 2022-11-30 | Disposition: A | Discharge status: Home / Self Care | Payer: Medicaid Other | Attending: Emergency Medicine | Admitting: Emergency Medicine

## 2022-11-30 DIAGNOSIS — J069 Acute upper respiratory infection, unspecified: Secondary | ICD-10-CM | POA: Diagnosis not present

## 2022-11-30 DIAGNOSIS — B9789 Other viral agents as the cause of diseases classified elsewhere: Secondary | ICD-10-CM | POA: Diagnosis not present

## 2022-11-30 DIAGNOSIS — R519 Headache, unspecified: Secondary | ICD-10-CM | POA: Diagnosis present

## 2022-11-30 DIAGNOSIS — J45909 Unspecified asthma, uncomplicated: Secondary | ICD-10-CM | POA: Insufficient documentation

## 2022-11-30 DIAGNOSIS — Z20822 Contact with and (suspected) exposure to covid-19: Secondary | ICD-10-CM | POA: Insufficient documentation

## 2022-11-30 LAB — RESP PANEL BY RT-PCR (RSV, FLU A&B, COVID)  RVPGX2
Influenza A by PCR: NEGATIVE
Influenza B by PCR: NEGATIVE
Resp Syncytial Virus by PCR: NEGATIVE
SARS Coronavirus 2 by RT PCR: NEGATIVE

## 2022-11-30 LAB — GROUP A STREP BY PCR: Group A Strep by PCR: NOT DETECTED

## 2022-11-30 MED ORDER — IBUPROFEN 800 MG PO TABS
800.0000 mg | ORAL_TABLET | Freq: Once | ORAL | Status: AC
Start: 2022-11-30 — End: 2022-11-30
  Administered 2022-11-30: 800 mg via ORAL

## 2022-11-30 MED ORDER — IBUPROFEN 800 MG PO TABS
ORAL_TABLET | ORAL | Status: AC
  Filled 2022-11-30: qty 1

## 2022-11-30 NOTE — ED Provider Triage Note (Signed)
Emergency Medicine Provider Triage Evaluation Note  [name] , a 32 y.o. female  was evaluated in triage.  Pt complains of sore throat, headache, fatigue started this morning.  Patient started home test positive for flu last week.  No cough fever nausea vomiting bowel changes urinary symptoms.  Review of Systems  Positive: As above Negative: As above  Physical Exam  BP 132/87   Pulse 93   Temp 99.8 F (37.7 C)   Resp 20   Wt 92 kg   SpO2 98%   BMI 34.54 kg/m  Gen:   Awake, no distress   Resp:  Normal effort  MSK:   Moves extremities without difficulty  Other:    Medical Decision Making  Medically screening exam initiated at 5:53 PM.  Appropriate orders placed.  Aremy Shavers was informed that the remainder of the evaluation will be completed by another provider, this initial triage assessment does not replace that evaluation, and the importance of remaining in the ED until their evaluation is complete.    Rex Kras, [place] 12/01/22 0030

## 2022-11-30 NOTE — ED Provider Notes (Signed)
[facility] Provider Note   CSN: [id number] Arrival date & time: 11/30/22  1713     History  Chief Complaint  Patient presents with   Sore Throat    [name] is a 32 y.o. female with a past medical history of anemia, anxiety, asthma presenting today for evaluation of cold symptoms.  Patient states she has had sore throat, headache, fatigue that started this morning.  Patient's daughter tested positive for flu a week ago.  She denies fever, chest pain, shortness of breath.   Sore Throat    Past Medical History:  Diagnosis Date   Anemia    Anxiety    Asthma    exercise induced   Complication of anesthesia    Head injury, closed    scalp laceration, dizzy for a few minutes   History of chlamydia    PONV (postoperative nausea and vomiting)    Von Willebrand disease ([place])    Past Surgical History:  Procedure Laterality Date   HAND SURGERY Left 2009   thumb , fracture   HAND SURGERY Left 2006   Pins placed   HARDWARE REMOVAL Left 02/10/2016   Procedure: LEFT SMALL FINGER DEEP IMPLANT REMOVAL;  Surgeon: Iran Planas, MD;  Location: [place];  Service: Orthopedics;  Laterality: Left;   OPEN REDUCTION INTERNAL FIXATION (ORIF) PROXIMAL PHALANX Left 01/06/2016   Procedure: OPEN REDUCTION INTERNAL FIXATION (ORIF) LEFT SMALL FINGER;  Surgeon: Iran Planas, MD;  Location: [place];  Service: Orthopedics;  Laterality: Left;     Home Medications Prior to Admission medications   Medication Sig Start Date End Date Taking? Authorizing Provider  albuterol (VENTOLIN HFA) 108 (90 Base) MCG/ACT inhaler Inhale 1-2 puffs into the lungs every 4 (four) hours as needed for wheezing or shortness of breath. Patient taking differently: Inhale 2 puffs into the lungs as needed for wheezing or shortness of breath. 11/25/19   Burleson, Rona Ravens, NP  cetirizine (ZYRTEC) 10 MG tablet Take 1 tablet (10 mg total) by mouth daily. 10/28/19   Wurst, [place], PA-C   EPINEPHrine 0.3 mg/0.3 mL IJ SOAJ injection Inject 0.3 mLs (0.3 mg total) into the muscle once as needed for up to 1 dose. As needed for bee stings 11/25/19   Virginia Rochester, NP  hydrOXYzine (ATARAX) 25 MG tablet Take 25-50 mg by mouth every 6 (six) hours as needed. 08/07/22   [provider]  ibuprofen (ADVIL) 800 MG tablet Take 1 tablet (800 mg total) by mouth every 8 (eight) hours as needed. 09/26/22   Tamela Gammon, NP  megestrol (MEGACE) 40 MG tablet Take 1 tablet (40 mg total) by mouth daily. 09/26/22   Tamela Gammon, NP  Multiple Vitamin (MULTIVITAMIN PO) Take by mouth. W/ Iron    [provider]      Allergies    Bee venom, Tapentadol, and Adhesive [tape]    Review of Systems   Review of Systems Negative except as per HPI.  Physical Exam Updated Vital Signs BP 132/87   Pulse 93   Temp (!) 100.5 F (38.1 C) (Oral)   Resp 20   Wt 92 kg   SpO2 98%   BMI 34.54 kg/m  Physical Exam Vitals and nursing note reviewed.  Constitutional:      Appearance: Normal appearance.  HENT:     Head: Normocephalic and atraumatic.     Mouth/Throat:     Mouth: Mucous membranes are moist.  Eyes:  General: No scleral icterus. Cardiovascular:     Rate and Rhythm: Normal rate and regular rhythm.     Pulses: Normal pulses.     Heart sounds: Normal heart sounds.  Pulmonary:     Effort: Pulmonary effort is normal.     Breath sounds: Normal breath sounds.  Abdominal:     General: Abdomen is flat.     Palpations: Abdomen is soft.     Tenderness: There is no abdominal tenderness.  Musculoskeletal:        General: No deformity.  Skin:    General: Skin is warm.     Findings: No rash.  Neurological:     General: No focal deficit present.     Mental Status: She is alert.  Psychiatric:        Mood and Affect: Mood normal.     ED Results / Procedures / Treatments   Labs (all labs ordered are listed, but only abnormal results are displayed) Labs Reviewed   RESP PANEL BY RT-PCR (RSV, FLU A&B, COVID)  RVPGX2  GROUP A STREP BY PCR    EKG None  Radiology No results found.  Procedures Procedures    Medications Ordered in ED Medications  ibuprofen (ADVIL) tablet 800 mg (has no administration in time range)    ED Course/ Medical Decision Making/ A&P                             Medical Decision Making  This patient presents to the ED for sore throat, body aches, this involves an extensive number of treatment options, and is a complaint that carries with a high risk of complications and morbidity.  The differential diagnosis includes flu, COVID, RSV, strep, pharyngitis, bronchitis, pneumonia, infectious etiology.  This is not an exhaustive list.  Lab tests: I ordered and personally interpreted labs.  The pertinent results include: Viral panel negative for flu, COVID, RSV.  Strep negative.  Imaging studies:  Problem list/ ED course/ Critical interventions/ Medical management: HPI: See above Vital signs within normal range and stable throughout visit. Laboratory/imaging studies significant for: See above. On physical examination, patient is afebrile and appears in no acute distress. This patient presents with symptoms suspicious for viral upper respiratory infection. Based on history and physical doubt sinusitis. COVID test was negative. Do not suspect underlying cardiopulmonary process. I considered, but think unlikely, pneumonia. Patient is nontoxic appearing and not in need of emergent medical intervention. Patient told to self isolate at home until symptoms subside for 72 hours. Recommended patient to take TheraFlu or Mucinex for symptom relief.  Follow-up with primary care physician for further evaluation and management.  Return to the ER if new or worsening symptoms. I have reviewed the patient home medicines and have made adjustments as needed.  Cardiac monitoring/EKG: The patient was maintained on a cardiac monitor.  I personally  reviewed and interpreted the cardiac monitor which showed an underlying rhythm of: sinus rhythm.  Additional history obtained: External records from outside source obtained and reviewed including: Chart review including previous notes, labs, imaging.  Consultations obtained:  Disposition Continued outpatient therapy. Follow-up with PCP recommended for reevaluation of symptoms. Treatment plan discussed with patient.  Pt acknowledged understanding was agreeable to the plan. Worrisome signs and symptoms were discussed with patient, and patient acknowledged understanding to return to the ED if they noticed these signs and symptoms. Patient was stable upon discharge.   This chart was dictated using voice  recognition software.  Despite best efforts to proofread,  errors can occur which can change the documentation meaning.          Final Clinical Impression(s) / ED Diagnoses Final diagnoses:  Viral upper respiratory tract infection    Rx / DC Orders ED Discharge Orders     None         Rex Kras, [place] 12/01/22 0029    Drenda Freeze, MD 12/05/22 [phone]

## 2022-11-30 NOTE — Discharge Instructions (Signed)
You tested negative for COVID, flu, RSV and strep today. Please take tylenol/ibuprofen for pain/fever.  You can also try Mucinex or TheraFlu for symptom relief.  I recommend close follow-up with PCP for reevaluation.  Please do not hesitate to return to emergency department if worrisome signs symptoms we discussed become apparent.

## 2022-11-30 NOTE — ED Triage Notes (Signed)
C/o sore throat, body aches, chills, and drainage.  Daughter diagnosed with flu one week ago.

## 2022-12-09 ENCOUNTER — Ambulatory Visit
Admission: RE | Admit: 2022-12-09 | Discharge: 2022-12-09 | Disposition: A | Discharge status: Home / Self Care | Payer: Medicaid Other | Source: Ambulatory Visit | Attending: Urgent Care | Admitting: Urgent Care

## 2022-12-09 VITALS — BP 96/66 | HR 73 | Temp 98.6°F | Resp 16

## 2022-12-09 DIAGNOSIS — J018 Other acute sinusitis: Secondary | ICD-10-CM

## 2022-12-09 LAB — POCT RAPID STREP A (OFFICE): Rapid Strep A Screen: NEGATIVE

## 2022-12-09 MED ORDER — AMOXICILLIN 875 MG PO TABS: 875.0000 mg | ORAL_TABLET | Freq: Two times a day (BID) | ORAL | 0 refills | Status: DC

## 2022-12-09 MED ORDER — PSEUDOEPHEDRINE HCL 60 MG PO TABS: 60.0000 mg | ORAL_TABLET | Freq: Three times a day (TID) | ORAL | 0 refills | Status: DC | PRN

## 2022-12-09 MED ORDER — CETIRIZINE HCL 10 MG PO TABS: 10.0000 mg | ORAL_TABLET | Freq: Every day | ORAL | 0 refills | Status: DC

## 2022-12-09 NOTE — ED Provider Notes (Signed)
[facility]  Note:  This document was prepared using Systems analyst and may include unintentional dictation errors.  MRN: [id number] DOB: 04-Dec-1990  Subjective:   [name] is a 32 y.o. female presenting for 1 week history of acute onset worsening throat pain, throat congestion, sinus congestion, occasional cough, left eye irritation. Had been seen at [facility], tested negative for strep, COVID, rsv, influenza.  Patient did have 1 sick contact with her daughter who was sick with the same illness but she is now recovered.  No chest pain, shortness of breath or wheezing, rashes.  Has a history of asthma but has not bothered her.  No smoking.  No current facility-administered medications for this encounter.  Current Outpatient Medications:    albuterol (VENTOLIN HFA) 108 (90 Base) MCG/ACT inhaler, Inhale 1-2 puffs into the lungs every 4 (four) hours as needed for wheezing or shortness of breath. (Patient taking differently: Inhale 2 puffs into the lungs as needed for wheezing or shortness of breath.), Disp: 18 g, Rfl: 2   cetirizine (ZYRTEC) 10 MG tablet, Take 1 tablet (10 mg total) by mouth daily., Disp: 30 tablet, Rfl: 0   EPINEPHrine 0.3 mg/0.3 mL IJ SOAJ injection, Inject 0.3 mLs (0.3 mg total) into the muscle once as needed for up to 1 dose. As needed for bee stings, Disp: 1 each, Rfl: 2   hydrOXYzine (ATARAX) 25 MG tablet, Take 25-50 mg by mouth every 6 (six) hours as needed., Disp: , Rfl:    ibuprofen (ADVIL) 800 MG tablet, Take 1 tablet (800 mg total) by mouth every 8 (eight) hours as needed., Disp: 60 tablet, Rfl: 1   megestrol (MEGACE) 40 MG tablet, Take 1 tablet (40 mg total) by mouth daily., Disp: 10 tablet, Rfl: 0   Multiple Vitamin (MULTIVITAMIN PO), Take by mouth. W/ Iron, Disp: , Rfl:    Allergies  Allergen Reactions   Bee Venom Anaphylaxis   Tapentadol Itching, Nausea And Vomiting and Rash   Adhesive [Tape] Itching and Rash     Past Medical History:  Diagnosis Date   Anemia    Anxiety    Asthma    exercise induced   Complication of anesthesia    Head injury, closed    scalp laceration, dizzy for a few minutes   History of chlamydia    PONV (postoperative nausea and vomiting)    Von Willebrand disease ([place])      Past Surgical History:  Procedure Laterality Date   HAND SURGERY Left 2009   thumb , fracture   HAND SURGERY Left 2006   Pins placed   HARDWARE REMOVAL Left 02/10/2016   Procedure: LEFT SMALL FINGER DEEP IMPLANT REMOVAL;  Surgeon: Iran Planas, MD;  Location: [place];  Service: Orthopedics;  Laterality: Left;   OPEN REDUCTION INTERNAL FIXATION (ORIF) PROXIMAL PHALANX Left 01/06/2016   Procedure: OPEN REDUCTION INTERNAL FIXATION (ORIF) LEFT SMALL FINGER;  Surgeon: Iran Planas, MD;  Location: [place];  Service: Orthopedics;  Laterality: Left;    Family History  Problem Relation Age of Onset   Hyperlipidemia Father    Diabetes Father    Hypertension Father    Hypercholesterolemia Father    Heart disease Father    Breast cancer Paternal Grandmother    Cancer Paternal Grandmother    Cancer Paternal Grandfather     Social History   Tobacco Use   Smoking status: Never   Smokeless tobacco: Never  Vaping Use   Vaping Use: Never  used  Substance Use Topics   Alcohol use: Yes    Comment: rarely   Drug use: No    ROS   Objective:   Vitals: BP 96/66 (BP Location: Right Arm)   Pulse 73   Temp 98.6 F (37 C) (Oral)   Resp 16   LMP 11/09/2022 (Approximate)   SpO2 94%   Physical Exam Constitutional:      General: She is not in acute distress.    Appearance: Normal appearance. She is well-developed and normal weight. She is not ill-appearing, toxic-appearing or diaphoretic.  HENT:     Head: Normocephalic and atraumatic.     Right Ear: Tympanic membrane, ear canal and external ear normal. No drainage or tenderness. No middle ear effusion. There is no impacted cerumen. Tympanic  membrane is not erythematous or bulging.     Left Ear: Tympanic membrane, ear canal and external ear normal. No drainage or tenderness.  No middle ear effusion. There is no impacted cerumen. Tympanic membrane is not erythematous or bulging.     Nose: Nose normal. No congestion or rhinorrhea.     Mouth/Throat:     Mouth: Mucous membranes are moist. No oral lesions.     Pharynx: Posterior oropharyngeal erythema (with significant post-nasal drainage overlying pharynx) present. No pharyngeal swelling, oropharyngeal exudate or uvula swelling.     Tonsils: No tonsillar exudate or tonsillar abscesses. 0 on the right. 0 on the left.  Eyes:     General: No scleral icterus.       Right eye: No discharge.        Left eye: No discharge.     Extraocular Movements: Extraocular movements intact.     Right eye: Normal extraocular motion.     Left eye: Normal extraocular motion.     Conjunctiva/sclera: Conjunctivae normal.  Cardiovascular:     Rate and Rhythm: Normal rate.  Pulmonary:     Effort: Pulmonary effort is normal.  Musculoskeletal:     Cervical back: Normal range of motion and neck supple.  Lymphadenopathy:     Cervical: No cervical adenopathy.  Skin:    General: Skin is warm and dry.  Neurological:     General: No focal deficit present.     Mental Status: She is alert and oriented to person, place, and time.  Psychiatric:        Mood and Affect: Mood normal.        Behavior: Behavior normal.     Results for orders placed or performed during the hospital encounter of 12/09/22 (from the past 24 hour(s))  POCT rapid strep A     Status: None   Collection Time: 12/09/22  9:05 AM  Result Value Ref Range   Rapid Strep A Screen Negative Negative    Assessment and Plan :   PDMP not reviewed this encounter.  1. Acute non-recurrent sinusitis of other sinus     Patient has already had testing done and was negative with a respiratory panel.  Will defer repeat testing.  Recommended  covering for acute sinusitis given timeline and progression of her illness.  Will defer throat culture.  Start amoxicillin, use supportive care.  Counseled patient on potential for adverse effects with medications prescribed/recommended today, ER and return-to-clinic precautions discussed, patient verbalized understanding.    Jaynee Eagles, [place] 12/09/22 [phone]

## 2022-12-09 NOTE — ED Triage Notes (Signed)
Pt states sore throat for 2 days.

## 2023-01-07 ENCOUNTER — Ambulatory Visit
Admission: EM | Admit: 2023-01-07 | Discharge: 2023-01-07 | Disposition: A | Discharge status: Home / Self Care | Payer: Medicaid Other | Attending: Urgent Care | Admitting: Urgent Care

## 2023-01-07 ENCOUNTER — Ambulatory Visit (INDEPENDENT_AMBULATORY_CARE_PROVIDER_SITE_OTHER): Payer: Medicaid Other

## 2023-01-07 DIAGNOSIS — M545 Low back pain, unspecified: Secondary | ICD-10-CM | POA: Diagnosis not present

## 2023-01-07 MED ORDER — PREDNISONE 20 MG PO TABS: ORAL_TABLET | ORAL | 0 refills | Status: DC

## 2023-01-07 MED ORDER — CYCLOBENZAPRINE HCL 5 MG PO TABS: 5.0000 mg | ORAL_TABLET | Freq: Every evening | ORAL | 0 refills | Status: DC | PRN

## 2023-01-07 NOTE — ED Provider Notes (Signed)
[facility]  Note:  This document was prepared using Systems analyst and may include unintentional dictation errors.  MRN: [id number] DOB: 1990-11-05  Subjective:   [name] is a 32 y.o. female presenting for 1 month history of persistent moderate to severe left sided low back pain.  Occasionally this radiates to the top of the left hip/buttock. No fall, trauma, numbness or tingling, saddle paresthesia, changes to bowel or urinary habits, radicular symptoms.  Patient does not do a lot of heavy lifting.  She has a young daughter but she is very active and mobile, does not require a lot of lifting.  Patient tries to hydrate consistently.  No current facility-administered medications for this encounter.  Current Outpatient Medications:    albuterol (VENTOLIN HFA) 108 (90 Base) MCG/ACT inhaler, Inhale 1-2 puffs into the lungs every 4 (four) hours as needed for wheezing or shortness of breath. (Patient taking differently: Inhale 2 puffs into the lungs as needed for wheezing or shortness of breath.), Disp: 18 g, Rfl: 2   amoxicillin (AMOXIL) 875 MG tablet, Take 1 tablet (875 mg total) by mouth 2 (two) times daily., Disp: 14 tablet, Rfl: 0   cetirizine (ZYRTEC ALLERGY) 10 MG tablet, Take 1 tablet (10 mg total) by mouth daily., Disp: 30 tablet, Rfl: 0   EPINEPHrine 0.3 mg/0.3 mL IJ SOAJ injection, Inject 0.3 mLs (0.3 mg total) into the muscle once as needed for up to 1 dose. As needed for bee stings, Disp: 1 each, Rfl: 2   hydrOXYzine (ATARAX) 25 MG tablet, Take 25-50 mg by mouth every 6 (six) hours as needed., Disp: , Rfl:    ibuprofen (ADVIL) 800 MG tablet, Take 1 tablet (800 mg total) by mouth every 8 (eight) hours as needed., Disp: 60 tablet, Rfl: 1   megestrol (MEGACE) 40 MG tablet, Take 1 tablet (40 mg total) by mouth daily., Disp: 10 tablet, Rfl: 0   Multiple Vitamin (MULTIVITAMIN PO), Take by mouth. W/ Iron, Disp: , Rfl:    pseudoephedrine (SUDAFED)  60 MG tablet, Take 1 tablet (60 mg total) by mouth every 8 (eight) hours as needed for congestion., Disp: 30 tablet, Rfl: 0   Allergies  Allergen Reactions   Bee Venom Anaphylaxis   Tapentadol Itching, Nausea And Vomiting and Rash   Adhesive [Tape] Itching and Rash    Past Medical History:  Diagnosis Date   Anemia    Anxiety    Asthma    exercise induced   Complication of anesthesia    Head injury, closed    scalp laceration, dizzy for a few minutes   History of chlamydia    PONV (postoperative nausea and vomiting)    Von Willebrand disease ([place])      Past Surgical History:  Procedure Laterality Date   HAND SURGERY Left 2009   thumb , fracture   HAND SURGERY Left 2006   Pins placed   HARDWARE REMOVAL Left 02/10/2016   Procedure: LEFT SMALL FINGER DEEP IMPLANT REMOVAL;  Surgeon: Iran Planas, MD;  Location: [place];  Service: Orthopedics;  Laterality: Left;   OPEN REDUCTION INTERNAL FIXATION (ORIF) PROXIMAL PHALANX Left 01/06/2016   Procedure: OPEN REDUCTION INTERNAL FIXATION (ORIF) LEFT SMALL FINGER;  Surgeon: Iran Planas, MD;  Location: [place];  Service: Orthopedics;  Laterality: Left;    Family History  Problem Relation Age of Onset   Hyperlipidemia Father    Diabetes Father    Hypertension Father    Hypercholesterolemia Father  Heart disease Father    Breast cancer Paternal Grandmother    Cancer Paternal Grandmother    Cancer Paternal Grandfather     Social History   Tobacco Use   Smoking status: Never   Smokeless tobacco: Never  Vaping Use   Vaping Use: Never used  Substance Use Topics   Alcohol use: Yes    Comment: rarely   Drug use: No    ROS   Objective:   Vitals: BP 119/78 (BP Location: Right Arm)   Pulse 65   Temp 98.8 F (37.1 C) (Oral)   Resp 16   LMP 01/04/2023 (Exact Date)   SpO2 96%   Breastfeeding No   Physical Exam Constitutional:      General: She is not in acute distress.    Appearance: Normal appearance. She is  well-developed. She is not ill-appearing, toxic-appearing or diaphoretic.  HENT:     Head: Normocephalic and atraumatic.     Nose: Nose normal.     Mouth/Throat:     Mouth: Mucous membranes are moist.  Eyes:     General: No scleral icterus.       Right eye: No discharge.        Left eye: No discharge.     Extraocular Movements: Extraocular movements intact.  Cardiovascular:     Rate and Rhythm: Normal rate.  Pulmonary:     Effort: Pulmonary effort is normal.  Musculoskeletal:     Lumbar back: Spasms and tenderness present. No swelling, edema, deformity, signs of trauma, lacerations or bony tenderness. Normal range of motion. Negative right straight leg raise test and negative left straight leg raise test. No scoliosis.       Back:  Skin:    General: Skin is warm and dry.  Neurological:     General: No focal deficit present.     Mental Status: She is alert and oriented to person, place, and time.  Psychiatric:        Mood and Affect: Mood normal.        Behavior: Behavior normal.        Thought Content: Thought content normal.        Judgment: Judgment normal.     Assessment and Plan :   PDMP not reviewed this encounter.  1. Acute left-sided low back pain without sciatica     X-ray over-read was pending at time of discharge, recommended follow up with only abnormal results. Otherwise will not call for negative over-read. Patient was in agreement.  Reviewed back care, general stretching. Since ibuprofen has not helped and back pain has progressed will use prednisone, muscle relaxant. Recommended follow up with the spine clinic if symptoms persist, consideration for an MRI. Counseled patient on potential for adverse effects with medications prescribed/recommended today, ER and return-to-clinic precautions discussed, patient verbalized understanding.    Jaynee Eagles, [place] 01/07/23 [id number]

## 2023-01-07 NOTE — ED Triage Notes (Signed)
Pt reports on and off  left sided low back pain x 1 month. Pain is worse when she try to stand up. Ibuprofen gives no relief.

## 2023-01-25 ENCOUNTER — Ambulatory Visit: Payer: Self-pay

## 2023-01-27 ENCOUNTER — Ambulatory Visit: Payer: Medicaid Other

## 2023-02-07 ENCOUNTER — Other Ambulatory Visit: Payer: Self-pay

## 2023-02-07 ENCOUNTER — Ambulatory Visit: Payer: Medicaid Other | Attending: Nurse Practitioner | Admitting: Physical Therapy

## 2023-02-07 ENCOUNTER — Encounter: Payer: Self-pay | Admitting: Physical Therapy

## 2023-02-07 DIAGNOSIS — M6281 Muscle weakness (generalized): Secondary | ICD-10-CM

## 2023-02-07 DIAGNOSIS — M5459 Other low back pain: Secondary | ICD-10-CM | POA: Diagnosis present

## 2023-02-07 NOTE — Therapy (Signed)
OUTPATIENT PHYSICAL THERAPY EVALUATION   Patient Name: [name] MRN: [id number] DOB:14-Dec-1990, 32 y.o., female Today's Date: 02/07/2023   END OF SESSION:  PT End of Session - 02/07/23 1330     Visit Number 1    Number of Visits 9    Date for PT Re-Evaluation 04/04/23    Authorization Type [employer]    Authorization - Number of Visits 27    PT Start Time 1515    PT Stop Time 1600    PT Time Calculation (min) 45 min    Activity Tolerance Patient tolerated treatment well    Behavior During Therapy WFL for tasks assessed/performed             Past Medical History:  Diagnosis Date   Anemia    Anxiety    Asthma    exercise induced   Complication of anesthesia    Head injury, closed    scalp laceration, dizzy for a few minutes   History of chlamydia    PONV (postoperative nausea and vomiting)    Von Willebrand disease (HCC)    Past Surgical History:  Procedure Laterality Date   HAND SURGERY Left 2009   thumb , fracture   HAND SURGERY Left 2006   Pins placed   HARDWARE REMOVAL Left 02/10/2016   Procedure: LEFT SMALL FINGER DEEP IMPLANT REMOVAL;  Surgeon: Bradly Bienenstock, MD;  Location: [facility] OR;  Service: Orthopedics;  Laterality: Left;   OPEN REDUCTION INTERNAL FIXATION (ORIF) PROXIMAL PHALANX Left 01/06/2016   Procedure: OPEN REDUCTION INTERNAL FIXATION (ORIF) LEFT SMALL FINGER;  Surgeon: Bradly Bienenstock, MD;  Location: [facility] OR;  Service: Orthopedics;  Laterality: Left;   Patient Active Problem List   Diagnosis Date Noted   Contusion of right knee 01/11/2021   BMI 36.0-36.9,adult 11/25/2019   Varicose veins of both lower extremities with pain 11/25/2019   Stress incontinence in female 11/25/2019   Influenza B 09/14/2018   Von Willebrand disease (HCC) 12/31/2016   History of multiple miscarriages 12/31/2016    PCP: None listed  REFERRING PROVIDER: Jadene Pierini, MD  REFERRING DIAG: Other dorsalgia  Rationale for Evaluation and Treatment:  Rehabilitation  THERAPY DIAG:  Other low back pain  Muscle weakness (generalized)  ONSET DATE: a couple of months   SUBJECTIVE:                                                                                                                                                                                          SUBJECTIVE STATEMENT: Patient reports she was have severe left lower back pain, any sudden movement would cause a sharp shooting pain and  would be dull if she wouldn't be doing anything. It was worse with sitting, and was bad for about a month. Currently she is not having any sharp shooting pain but still is achy. Patient unable to recall a mechanism of injury, she does have a daughter that she picks up but cannot remember a specific injury. She reports the pain does not keep her from doing anything. In the last few days she has been able to lift her daughter. She also reports she is a side sleeper and is having to alternate sides throughout the night. She does report a history of left leg occasionally having sharp pains that dates back to pregnancy about 4 years ago.  PERTINENT HISTORY:  See PMH above  PAIN:  Are you having pain? Yes:  NPRS scale: 5-6/10 Pain location: Lower back,  Pain description: Dull, achy Aggravating factors: Sitting Relieving factors: Walking  PRECAUTIONS: None  WEIGHT BEARING RESTRICTIONS: No  FALLS:  Has patient fallen in last 6 months? No  OCCUPATION: Mostly sitting  PLOF: Independent  PATIENT GOALS: Pain relief   OBJECTIVE:  DIAGNOSTIC FINDINGS:  Lumbar X-ray 01/07/2023 IMPRESSION: Mild lumbosacral junction spondylosis, without acute superimposed process.  PATIENT SURVEYS:  FOTO 61% functional status  SCREENING FOR RED FLAGS: Negative  COGNITION: Overall cognitive status: Within functional limits for tasks assessed     SENSATION: WFL  MUSCLE LENGTH: Limitations in L > R hamstring, hip flexor, and quad flexibility  POSTURE:    Rounded shoulders  PALPATION: TTP left lower lumbar paraspinals, SIJ region  LUMBAR ROM:   AROM eval  Flexion 75%  Extension WFL  Right lateral flexion 75%  Left lateral flexion WFL  Right rotation 75%  Left rotation WFL   (Blank rows = not tested)  LOWER EXTREMITY ROM:      LE ROM grossly WFL  LOWER EXTREMITY MMT:    MMT Right eval Left eval  Hip flexion 4 4  Hip extension 4- 3  Hip abduction 4 4-  Hip adduction    Hip internal rotation    Hip external rotation    Knee flexion 5 4  Knee extension 5 5  Ankle dorsiflexion    Ankle plantarflexion    Ankle inversion    Ankle eversion     (Blank rows = not tested)  LUMBAR SPECIAL TESTS:  Radicular testing negative  FUNCTIONAL TESTS:  Patient demonstrates difficulty maintaining lumbar control while performing 90-90 abdominal set  GAIT: Assistive device utilized: None Level of assistance: Complete Independence Comments: WFL   TODAY'S TREATMENT:        [facility] Adult PT Treatment:                                                DATE: 01/28/2023 Therapeutic Exercise: Supine piriformis stretch push/pull x 20 sec each Supine posterior pelvic tilt 10 x 5 sec PPT with march x 5 Side clamshell with red x 10  PATIENT EDUCATION:  Education details: Exam findings, POC, HEP Person educated: Patient Education method: Explanation, Demonstration, Tactile cues, Verbal cues, and Handouts Education comprehension: verbalized understanding, returned demonstration, verbal cues required, tactile cues required, and needs further education  HOME EXERCISE PROGRAM: Access Code: [email or web address]    ASSESSMENT: CLINICAL IMPRESSION: Patient is a 32 y.o. female who was seen today for physical therapy evaluation and treatment for chronic low back pain primarily on  the left side. She does demonstrate slight limitations in lumbar motion from left lumbar tightness, left hip tightness, and gross core and hip strength deficits likely contributing  to pain and impacting her functional ability.   OBJECTIVE IMPAIRMENTS: decreased activity tolerance, decreased ROM, decreased strength, impaired flexibility, and pain.   ACTIVITY LIMITATIONS: lifting and sitting  PARTICIPATION LIMITATIONS: occupation  PERSONAL FACTORS: Fitness, Past/current experiences, and Time since onset of injury/illness/exacerbation are also affecting patient's functional outcome.   REHAB POTENTIAL: Good  CLINICAL DECISION MAKING: Stable/uncomplicated  EVALUATION COMPLEXITY: Low   GOALS: Goals reviewed with patient? Yes  SHORT TERM GOALS: Target date: 03/07/2023  Patient will be I with initial HEP in order to progress with therapy. Baseline: HEP provided at eval Goal status: INITIAL  2.  Patient will demonstrate lumbar AROM WFL and non-painful to improve ability to perform household tasks without limitation Baseline: limitations noted above Goal status: INITIAL  LONG TERM GOALS: Target date: 04/04/2023  Patient will be I with final HEP to maintain progress from PT. Baseline: HEP provided at eval Goal status: INITIAL  2.  Patient will report >/= 71% status on FOTO to indicate improved functional ability. Baseline: 61% functional status Goal status: INITIAL  3.  Patient will demonstrate core and hip strength grossly >/= 4/5 MMT in order to improve lumbar control and reduce pain with sitting Baseline: strength limitations noted above Goal status: INITIAL  4.  Patient will report low back pain </= 2/10 with extended periods of sitting in order to improve ability to perform work related tasks Baseline: 5-6/10 pain Goal status: INITIAL   PLAN: PT FREQUENCY: 1x/week  PT DURATION: 8 weeks  PLANNED INTERVENTIONS: Therapeutic exercises, Therapeutic activity, Neuromuscular re-education, Balance training, Gait training, Patient/Family education, Self Care, Joint mobilization, Joint manipulation, Aquatic Therapy, Dry Needling, Spinal manipulation, Spinal  mobilization, Cryotherapy, Moist heat, Manual therapy, and Re-evaluation.  PLAN FOR NEXT SESSION: Review HEP and progress PRN, dry needling PRN for left lumbar, lumbar and hip stretching, progress core stabilization and hip strengthening   Rosana Hoes, PT, DPT, LAT, ATC 02/07/23  2:47 PM Phone: [phone] Fax: [phone]

## 2023-02-07 NOTE — Patient Instructions (Signed)
Access Code: [email or web address] URL: [email or web address] Date: 02/07/2023 Prepared by: Rosana Hoes  Exercises - Supine Piriformis Stretch with Foot on Ground  - 1-2 x daily - 3 reps - 20 seconds hold - Supine Figure 4 Piriformis Stretch  - 1-2 x daily - 3 reps - 20 seconds hold - Supine Posterior Pelvic Tilt  - 1 x daily - 10 reps - 5 seconds hold - Supine March with Posterior Pelvic Tilt  - 1 x daily - 2 sets - 5 reps - Clam with Resistance  - 1 x daily - 2 sets - 15 reps

## 2023-02-21 NOTE — Therapy (Addendum)
OUTPATIENT PHYSICAL THERAPY TREATMENT NOTE  DISCHARGE   Patient Name: [name] MRN: [id number] DOB:Feb 07, 1991, 32 y.o., female Today's Date: 02/22/2023  PCP: none REFERRING PROVIDER: Jadene Pierini, MD   END OF SESSION:   PT End of Session - 02/22/23 1019     Visit Number 2    Number of Visits 9    Date for PT Re-Evaluation 04/04/23    Authorization Type [employer]    Authorization - Number of Visits 27    PT Start Time 1019    PT Stop Time 1101    PT Time Calculation (min) 42 min    Activity Tolerance Patient tolerated treatment well    Behavior During Therapy WFL for tasks assessed/performed             Past Medical History:  Diagnosis Date   Anemia    Anxiety    Asthma    exercise induced   Complication of anesthesia    Head injury, closed    scalp laceration, dizzy for a few minutes   History of chlamydia    PONV (postoperative nausea and vomiting)    Von Willebrand disease (HCC)    Past Surgical History:  Procedure Laterality Date   HAND SURGERY Left 2009   thumb , fracture   HAND SURGERY Left 2006   Pins placed   HARDWARE REMOVAL Left 02/10/2016   Procedure: LEFT SMALL FINGER DEEP IMPLANT REMOVAL;  Surgeon: Bradly Bienenstock, MD;  Location: [facility] OR;  Service: Orthopedics;  Laterality: Left;   OPEN REDUCTION INTERNAL FIXATION (ORIF) PROXIMAL PHALANX Left 01/06/2016   Procedure: OPEN REDUCTION INTERNAL FIXATION (ORIF) LEFT SMALL FINGER;  Surgeon: Bradly Bienenstock, MD;  Location: [facility] OR;  Service: Orthopedics;  Laterality: Left;   Patient Active Problem List   Diagnosis Date Noted   Contusion of right knee 01/11/2021   BMI 36.0-36.9,adult 11/25/2019   Varicose veins of both lower extremities with pain 11/25/2019   Stress incontinence in female 11/25/2019   Influenza B 09/14/2018   Von Willebrand disease (HCC) 12/31/2016   History of multiple miscarriages 12/31/2016    REFERRING DIAG: Other dorsalgia   THERAPY DIAG:  Other low back pain  Muscle  weakness (generalized)  Rationale for Evaluation and Treatment Rehabilitation  PERTINENT HISTORY: See PMH above   PRECAUTIONS: None   SUBJECTIVE:                                                                                                                                                                                      SUBJECTIVE STATEMENT:  "I'm feeling ok. The pain is not as severe, but it's still achy." She has not been  completing her exercises.    PAIN:  Are you having pain? Yes: NPRS scale: 5/10 Pain location: low back Pain description: dull,ache Aggravating factors: prolonged sitting Relieving factors: stretching,walking   OBJECTIVE: (objective measures completed at initial evaluation unless otherwise dated)  DIAGNOSTIC FINDINGS:  Lumbar X-ray 01/07/2023 IMPRESSION: Mild lumbosacral junction spondylosis, without acute superimposed process.   PATIENT SURVEYS:  FOTO 61% functional status   SCREENING FOR RED FLAGS: Negative   COGNITION: Overall cognitive status: Within functional limits for tasks assessed                          SENSATION: WFL   MUSCLE LENGTH: Limitations in L > R hamstring, hip flexor, and quad flexibility   POSTURE:             Rounded shoulders   PALPATION: TTP left lower lumbar paraspinals, SIJ region   LUMBAR ROM:    AROM eval  Flexion 75%  Extension WFL  Right lateral flexion 75%  Left lateral flexion WFL  Right rotation 75%  Left rotation WFL   (Blank rows = not tested)   LOWER EXTREMITY ROM:                          LE ROM grossly WFL   LOWER EXTREMITY MMT:     MMT Right eval Left eval  Hip flexion 4 4  Hip extension 4- 3  Hip abduction 4 4-  Hip adduction      Hip internal rotation      Hip external rotation      Knee flexion 5 4  Knee extension 5 5  Ankle dorsiflexion      Ankle plantarflexion      Ankle inversion      Ankle eversion       (Blank rows = not tested)   LUMBAR SPECIAL TESTS:   Radicular testing negative   FUNCTIONAL TESTS:  Patient demonstrates difficulty maintaining lumbar control while performing 90-90 abdominal set   GAIT: Assistive device utilized: None Level of assistance: Complete Independence Comments: WFL     TODAY'S TREATMENT:        [facility] Adult PT Treatment:                                                DATE: 02/22/23 Therapeutic Exercise: Supine piriformis stretch push/pull x 30 sec each Supine posterior pelvic tilts x 10; 5 sec hold  Supine march 2 x 10  Hip bridge 2 x 10  Clamshells 2 x 10 green band  HS stretch with strap x 30 sec each Quad stretch with strap x 30 sec each  Quadruped arm reach 2 x 10  SLR with posterior pelvic tilt 2 x 5     [facility] Adult PT Treatment:                                                DATE: 01/28/2023 Therapeutic Exercise: Supine piriformis stretch push/pull x 20 sec each Supine posterior pelvic tilt 10 x 5 sec PPT with march x 5 Side clamshell with red x 10   PATIENT EDUCATION:  Education details: HEP review Person educated:  Patient Education method: Explanation, Demonstration, Tactile cues, Verbal cues,  Education comprehension: verbalized understanding, returned demonstration, verbal cues required, tactile cues required, and needs further education   HOME EXERCISE PROGRAM: Access Code: [email or web address]      ASSESSMENT: CLINICAL IMPRESSION: Patient arrives with moderate low back pain and reports non-adherence to initially prescribed HEP. Reviewed HEP with patient requiring frequent cues to breath with core stabilization. She demonstrates good core activation with supine strengthening, but quickly fatigues. She reported more difficulty with hip strengthening on the LLE vs. RLE. She reported resolution of her back pain at conclusion of session. No changes were made to HEP with patient encouraged to complete initially prescribed exercises with patient verbalizing understanding.      OBJECTIVE IMPAIRMENTS:  decreased activity tolerance, decreased ROM, decreased strength, impaired flexibility, and pain.    ACTIVITY LIMITATIONS: lifting and sitting   PARTICIPATION LIMITATIONS: occupation   PERSONAL FACTORS: Fitness, Past/current experiences, and Time since onset of injury/illness/exacerbation are also affecting patient's functional outcome.    REHAB POTENTIAL: Good   CLINICAL DECISION MAKING: Stable/uncomplicated   EVALUATION COMPLEXITY: Low     GOALS: Goals reviewed with patient? Yes   SHORT TERM GOALS: Target date: 03/07/2023   Patient will be I with initial HEP in order to progress with therapy. Baseline: HEP provided at eval Goal status: INITIAL   2.  Patient will demonstrate lumbar AROM WFL and non-painful to improve ability to perform household tasks without limitation Baseline: limitations noted above Goal status: INITIAL   LONG TERM GOALS: Target date: 04/04/2023   Patient will be I with final HEP to maintain progress from PT. Baseline: HEP provided at eval Goal status: INITIAL   2.  Patient will report >/= 71% status on FOTO to indicate improved functional ability. Baseline: 61% functional status Goal status: INITIAL   3.  Patient will demonstrate core and hip strength grossly >/= 4/5 MMT in order to improve lumbar control and reduce pain with sitting Baseline: strength limitations noted above Goal status: INITIAL   4.  Patient will report low back pain </= 2/10 with extended periods of sitting in order to improve ability to perform work related tasks Baseline: 5-6/10 pain Goal status: INITIAL     PLAN: PT FREQUENCY: 1x/week   PT DURATION: 8 weeks   PLANNED INTERVENTIONS: Therapeutic exercises, Therapeutic activity, Neuromuscular re-education, Balance training, Gait training, Patient/Family education, Self Care, Joint mobilization, Joint manipulation, Aquatic Therapy, Dry Needling, Spinal manipulation, Spinal mobilization, Cryotherapy, Moist heat, Manual therapy,  and Re-evaluation.   PLAN FOR NEXT SESSION: Review HEP and progress PRN, dry needling PRN for left lumbar, lumbar and hip stretching, progress core stabilization and hip strengthening   [name], PT, DPT, ATC 02/22/23 11:01 AM   PHYSICAL THERAPY DISCHARGE SUMMARY  Visits from Start of Care: 2  Current functional level related to goals / functional outcomes: See above   Remaining deficits: See above   Education / Equipment: HEP   Patient agrees to discharge. Patient goals were not met. Patient is being discharged due to not returning since the last visit.  [name], PT, DPT, LAT, ATC 04/20/23  11:43 AM Phone: [phone] Fax: [phone]

## 2023-02-22 ENCOUNTER — Ambulatory Visit: Payer: Medicaid Other | Attending: Nurse Practitioner

## 2023-02-22 DIAGNOSIS — M6281 Muscle weakness (generalized): Secondary | ICD-10-CM | POA: Diagnosis present

## 2023-02-22 DIAGNOSIS — M5459 Other low back pain: Secondary | ICD-10-CM | POA: Insufficient documentation

## 2023-02-24 ENCOUNTER — Ambulatory Visit: Payer: Medicaid Other

## 2023-02-25 ENCOUNTER — Ambulatory Visit
Admission: RE | Admit: 2023-02-25 | Discharge: 2023-02-25 | Disposition: A | Discharge status: Home / Self Care | Payer: Medicaid Other | Source: Ambulatory Visit | Attending: Nurse Practitioner | Admitting: Nurse Practitioner

## 2023-02-25 VITALS — BP 106/72 | HR 66 | Temp 98.7°F | Resp 16

## 2023-02-25 DIAGNOSIS — H6992 Unspecified Eustachian tube disorder, left ear: Secondary | ICD-10-CM | POA: Diagnosis not present

## 2023-02-25 MED ORDER — FLUTICASONE PROPIONATE 50 MCG/ACT NA SUSP: 1.0000 | Freq: Every day | NASAL | 0 refills | Status: DC

## 2023-02-25 MED ORDER — PSEUDOEPHEDRINE HCL 60 MG PO TABS: 60.0000 mg | ORAL_TABLET | Freq: Three times a day (TID) | ORAL | 0 refills | Status: AC | PRN

## 2023-02-25 NOTE — ED Triage Notes (Signed)
Pt presents to UC w/ c/o left ear pain x2 days after blowing her nose and hearing her left ear pop.

## 2023-02-25 NOTE — Discharge Instructions (Signed)
Start Flonase daily Start Sudafed every 8 hours as you need to for congestion Follow-up with your PCP if your symptoms do not improve Please go to the ER if you develop any worsening symptoms I hope you feel better soon!

## 2023-02-25 NOTE — ED Provider Notes (Signed)
[facility]    CSN: [id number] Arrival date & time: 02/25/23  1151      History   Chief Complaint Chief Complaint  Patient presents with   Otalgia    left    HPI [name] is a 32 y.o. female presents for evaluation of ear pain.  Patient reports for the past few days she has had some nasal congestion primarily on the left side.  2 days ago she blew her nose and she felt a pop in her left ear had some ringing and has had intermittent ear pain since that time.  Denies any hearing changes or drainage from the ear.  No fevers or chills.  She took some ibuprofen OTC for symptoms.  No other concerns at this time.   Otalgia   Past Medical History:  Diagnosis Date   Anemia    Anxiety    Asthma    exercise induced   Complication of anesthesia    Head injury, closed    scalp laceration, dizzy for a few minutes   History of chlamydia    PONV (postoperative nausea and vomiting)    Von Willebrand disease (HCC)     Patient Active Problem List   Diagnosis Date Noted   Contusion of right knee 01/11/2021   BMI 36.0-36.9,adult 11/25/2019   Varicose veins of both lower extremities with pain 11/25/2019   Stress incontinence in female 11/25/2019   Influenza B 09/14/2018   Von Willebrand disease (HCC) 12/31/2016   History of multiple miscarriages 12/31/2016    Past Surgical History:  Procedure Laterality Date   HAND SURGERY Left 2009   thumb , fracture   HAND SURGERY Left 2006   Pins placed   HARDWARE REMOVAL Left 02/10/2016   Procedure: LEFT SMALL FINGER DEEP IMPLANT REMOVAL;  Surgeon: Bradly Bienenstock, MD;  Location: [facility] OR;  Service: Orthopedics;  Laterality: Left;   OPEN REDUCTION INTERNAL FIXATION (ORIF) PROXIMAL PHALANX Left 01/06/2016   Procedure: OPEN REDUCTION INTERNAL FIXATION (ORIF) LEFT SMALL FINGER;  Surgeon: Bradly Bienenstock, MD;  Location: [facility] OR;  Service: Orthopedics;  Laterality: Left;    OB History     Gravida  3   Para  1   Term  1   Preterm       AB  2   Living  1      SAB  2   IAB      Ectopic      Multiple      Live Births               Home Medications    Prior to Admission medications   Medication Sig Start Date End Date Taking? Authorizing Provider  fluticasone (FLONASE) 50 MCG/ACT nasal spray Place 1 spray into both nostrils daily. 02/25/23  Yes Radford Pax, NP  pseudoephedrine (SUDAFED) 60 MG tablet Take 1 tablet (60 mg total) by mouth every 8 (eight) hours as needed for up to 7 days for congestion. 02/25/23 03/04/23 Yes Radford Pax, NP  albuterol (VENTOLIN HFA) 108 (90 Base) MCG/ACT inhaler Inhale 1-2 puffs into the lungs every 4 (four) hours as needed for wheezing or shortness of breath. Patient taking differently: Inhale 2 puffs into the lungs as needed for wheezing or shortness of breath. 11/25/19   Burleson, Brand Males, NP  amoxicillin (AMOXIL) 875 MG tablet Take 1 tablet (875 mg total) by mouth 2 (two) times daily. 12/09/22   Wallis Bamberg, PA-C  cetirizine (ZYRTEC ALLERGY) 10 MG  tablet Take 1 tablet (10 mg total) by mouth daily. 12/09/22   Wallis Bamberg, PA-C  cyclobenzaprine (FLEXERIL) 5 MG tablet Take 1 tablet (5 mg total) by mouth at bedtime as needed for muscle spasms. 01/07/23   Wallis Bamberg, PA-C  EPINEPHrine 0.3 mg/0.3 mL IJ SOAJ injection Inject 0.3 mLs (0.3 mg total) into the muscle once as needed for up to 1 dose. As needed for bee stings 11/25/19   Currie Paris, NP  hydrOXYzine (ATARAX) 25 MG tablet Take 25-50 mg by mouth every 6 (six) hours as needed. 08/07/22   [provider]  megestrol (MEGACE) 40 MG tablet Take 1 tablet (40 mg total) by mouth daily. 09/26/22   Olivia Mackie, NP  Multiple Vitamin (MULTIVITAMIN PO) Take by mouth. W/ Iron    [provider]  predniSONE (DELTASONE) 20 MG tablet Take 2 tablets daily with breakfast. 01/07/23   Wallis Bamberg, PA-C    Family History Family History  Problem Relation Age of Onset   Hyperlipidemia Father    Diabetes Father     Hypertension Father    Hypercholesterolemia Father    Heart disease Father    Breast cancer Paternal Grandmother    Cancer Paternal Grandmother    Cancer Paternal Grandfather     Social History Social History   Tobacco Use   Smoking status: Never   Smokeless tobacco: Never  Vaping Use   Vaping Use: Never used  Substance Use Topics   Alcohol use: Yes    Comment: rarely   Drug use: No     Allergies   Bee venom, Tapentadol, and Adhesive [tape]   Review of Systems Review of Systems  HENT:  Positive for ear pain.      Physical Exam Triage Vital Signs ED Triage Vitals  Enc Vitals Group     BP 02/25/23 1234 106/72     Pulse Rate 02/25/23 1234 66     Resp 02/25/23 1234 16     Temp 02/25/23 1234 98.7 F (37.1 C)     Temp Source 02/25/23 1234 Oral     SpO2 02/25/23 1234 94 %     Weight --      Height --      Head Circumference --      Peak Flow --      Pain Score 02/25/23 1233 4     Pain Loc --      Pain Edu? --      Excl. in GC? --    No data found.  Updated Vital Signs BP 106/72 (BP Location: Right Arm)   Pulse 66   Temp 98.7 F (37.1 C) (Oral)   Resp 16   LMP 02/19/2023 (Approximate)   SpO2 94%   Visual Acuity Right Eye Distance:   Left Eye Distance:   Bilateral Distance:    Right Eye Near:   Left Eye Near:    Bilateral Near:     Physical Exam Vitals and nursing note reviewed.  Constitutional:      General: She is not in acute distress.    Appearance: Normal appearance. She is not ill-appearing.  HENT:     Head: Normocephalic and atraumatic.     Right Ear: Tympanic membrane and ear canal normal.     Left Ear: Ear canal normal. A middle ear effusion is present. Tympanic membrane is not erythematous.     Nose: Congestion present.     Mouth/Throat:     Mouth: Mucous membranes are moist.  Eyes:     Pupils: Pupils are equal, round, and reactive to light.  Cardiovascular:     Rate and Rhythm: Normal rate.  Pulmonary:     Effort: Pulmonary  effort is normal.  Skin:    General: Skin is warm and dry.  Neurological:     General: No focal deficit present.     Mental Status: She is alert and oriented to person, place, and time.  Psychiatric:        Mood and Affect: Mood normal.        Behavior: Behavior normal.      UC Treatments / Results  Labs (all labs ordered are listed, but only abnormal results are displayed) Labs Reviewed - No data to display  EKG   Radiology No results found.  Procedures Procedures (including critical care time)  Medications Ordered in UC Medications - No data to display  Initial Impression / Assessment and Plan / UC Course  I have reviewed the triage vital signs and the nursing notes.  Pertinent labs & imaging results that were available during my care of the patient were reviewed by me and considered in my medical decision making (see chart for details).     Reviewed exam and symptoms with patient.  No red flags Discussed eustachian tube dysfunction.  Trial of Flonase and Sudafed Patient to follow-up with PCP if symptoms do not improve ER precautions reviewed and patient verbalized understanding Final Clinical Impressions(s) / UC Diagnoses   Final diagnoses:  Eustachian tube dysfunction, left     Discharge Instructions      Start Flonase daily Start Sudafed every 8 hours as you need to for congestion Follow-up with your PCP if your symptoms do not improve Please go to the ER if you develop any worsening symptoms I hope you feel better soon!    ED Prescriptions     Medication Sig Dispense Auth. Provider   fluticasone (FLONASE) 50 MCG/ACT nasal spray Place 1 spray into both nostrils daily. 15.8 mL Radford Pax, NP   pseudoephedrine (SUDAFED) 60 MG tablet Take 1 tablet (60 mg total) by mouth every 8 (eight) hours as needed for up to 7 days for congestion. 20 tablet Radford Pax, NP      PDMP not reviewed this encounter.   Radford Pax, NP 02/25/23 1252

## 2023-02-28 ENCOUNTER — Encounter (HOSPITAL_COMMUNITY): Payer: Self-pay | Admitting: Emergency Medicine

## 2023-02-28 ENCOUNTER — Emergency Department (HOSPITAL_COMMUNITY)
Admission: EM | Admit: 2023-02-28 | Discharge: 2023-02-28 | Disposition: A | Discharge status: Home / Self Care | Payer: Medicaid Other | Attending: Emergency Medicine | Admitting: Emergency Medicine

## 2023-02-28 ENCOUNTER — Other Ambulatory Visit: Payer: Self-pay

## 2023-02-28 ENCOUNTER — Emergency Department (HOSPITAL_COMMUNITY): Payer: Medicaid Other

## 2023-02-28 DIAGNOSIS — D72829 Elevated white blood cell count, unspecified: Secondary | ICD-10-CM | POA: Insufficient documentation

## 2023-02-28 DIAGNOSIS — R1031 Right lower quadrant pain: Secondary | ICD-10-CM | POA: Insufficient documentation

## 2023-02-28 LAB — COMPREHENSIVE METABOLIC PANEL
ALT: 27 U/L (ref 0–44)
AST: 24 U/L (ref 15–41)
Albumin: 4.1 g/dL (ref 3.5–5.0)
Alkaline Phosphatase: 63 U/L (ref 38–126)
Anion gap: 8 (ref 5–15)
BUN: 12 mg/dL (ref 6–20)
CO2: 23 mmol/L (ref 22–32)
Calcium: 8.9 mg/dL (ref 8.9–10.3)
Chloride: 107 mmol/L (ref 98–111)
Creatinine, Ser: 0.83 mg/dL (ref 0.44–1.00)
GFR, Estimated: >60 mL/min (ref >60)
Glucose, Bld: 111 mg/dL — ABNORMAL HIGH (ref 70–99)
Potassium: 4.1 mmol/L (ref 3.5–5.1)
Sodium: 138 mmol/L (ref 135–145)
Total Bilirubin: 0.4 mg/dL (ref 0.3–1.2)
Total Protein: 7.5 g/dL (ref 6.5–8.1)

## 2023-02-28 LAB — CBC WITH DIFFERENTIAL/PLATELET
Abs Immature Granulocytes: 0.07 10*3/uL (ref 0.00–0.07)
Basophils Absolute: 0.1 10*3/uL (ref 0.0–0.1)
Basophils Relative: 0 %
Eosinophils Absolute: 0.1 10*3/uL (ref 0.0–0.5)
Eosinophils Relative: 1 %
HCT: 36.6 % (ref 36.0–46.0)
Hemoglobin: 11.8 g/dL — ABNORMAL LOW (ref 12.0–15.0)
Immature Granulocytes: 0 %
Lymphocytes Relative: 15 %
Lymphs Abs: 2.7 10*3/uL (ref 0.7–4.0)
MCH: 28.2 pg (ref 26.0–34.0)
MCHC: 32.2 g/dL (ref 30.0–36.0)
MCV: 87.6 fL (ref 80.0–100.0)
Monocytes Absolute: 1.3 10*3/uL — ABNORMAL HIGH (ref 0.1–1.0)
Monocytes Relative: 7 %
Neutro Abs: 14.6 10*3/uL — ABNORMAL HIGH (ref 1.7–7.7)
Neutrophils Relative %: 77 %
Platelets: 451 10*3/uL — ABNORMAL HIGH (ref 150–400)
RBC: 4.18 MIL/uL (ref 3.87–5.11)
RDW: 12.5 % (ref 11.5–15.5)
WBC: 18.8 10*3/uL — ABNORMAL HIGH (ref 4.0–10.5)
nRBC: 0 % (ref 0.0–0.2)

## 2023-02-28 LAB — URINALYSIS, ROUTINE W REFLEX MICROSCOPIC
Bilirubin Urine: NEGATIVE
Glucose, UA: NEGATIVE mg/dL
Hgb urine dipstick: NEGATIVE
Ketones, ur: NEGATIVE mg/dL
Leukocytes,Ua: NEGATIVE
Nitrite: NEGATIVE
Protein, ur: NEGATIVE mg/dL
Specific Gravity, Urine: 1.011 (ref 1.005–1.030)
pH: 5 (ref 5.0–8.0)

## 2023-02-28 LAB — PREGNANCY, URINE: Preg Test, Ur: NEGATIVE

## 2023-02-28 LAB — LIPASE, BLOOD: Lipase: 37 U/L (ref 11–51)

## 2023-02-28 MED ORDER — IOHEXOL 300 MG/ML  SOLN
100.0000 mL | Freq: Once | INTRAMUSCULAR | Status: AC | PRN
  Administered 2023-02-28: 100 mL via INTRAVENOUS

## 2023-02-28 MED ORDER — SODIUM CHLORIDE (PF) 0.9 % IJ SOLN
INTRAMUSCULAR | Status: AC
  Filled 2023-02-28: qty 50

## 2023-02-28 NOTE — Discharge Instructions (Signed)
Your workup today is reassuring.  Ultrasound did not show anything concerning.  CT did not show anything concerning.  Follow-up with your primary care provider.  They can repeat the blood work to ensure the infection count is downtrending and returning to normal.  If symptoms worsen please return to the emergency room.

## 2023-02-28 NOTE — ED Triage Notes (Signed)
Pt reports right sided groin pain that started last night waking her up from sleep, denies injury, fevers, urinary symptoms

## 2023-02-28 NOTE — ED Provider Notes (Signed)
Handoff received on this 32 year old female who presents with right lower abdominal pain.  At this point ultrasounds have been completed and shows no evidence of torsion or other acute process.  She does have some leukocytosis.  She is afebrile.  CT pending at the time of signout. Physical Exam  BP 109/78   Pulse 83   Temp 99.2 F (37.3 C) (Oral)   Resp 18   Ht 5\' 4"  (1.626 m)   Wt 94.8 kg   LMP 02/19/2023 (Approximate) Comment: negative urine pregnancy test 02/28/23  SpO2 94%   BMI 35.87 kg/m    Procedures  Procedures  ED Course / MDM    Medical Decision Making Amount and/or Complexity of Data Reviewed Labs: ordered. Radiology: ordered.  Risk Prescription drug management.   CT without evidence of acute process.  Discussed close follow-up with PCP.  She has follow-up this week with PCP.  Strict return precautions given.  Zofran and Bentyl prescribed.  Patient is appropriate for discharge.  Discharged in stable condition.       Marita Kansas, PA-C 02/28/23 0900    Benjiman Core, MD 02/28/23 [phone]

## 2023-02-28 NOTE — ED Provider Notes (Signed)
[facility] Emergency Department Provider Note MRN:  [id number]  Arrival date & time: 02/28/23     Chief Complaint   Groin Pain   History of Present Illness   [name] is a 32 y.o. year-old female presents to the ED with chief complaint of right adnexal pain.  The pain was sudden onset tonight.  She states that it was excruciating.  She has not experienced pain like it before.  She took ibuprofen 800 and when it did not improve she came to the emergency department for evaluation.  She states that since then the pain has significantly subsided.  She denies any dysuria, hematuria, vaginal discharge, or bleeding.  She denies nausea, vomiting, or diarrhea.  Denies any fevers or chills.  History provided by patient.   Review of Systems  Pertinent positive and negative review of systems noted in HPI.    Physical Exam   Vitals:   02/28/23 0445 02/28/23 0521  BP: 116/69 116/69  Pulse: 62 62  Resp:  18  Temp:    SpO2: 99% 99%    CONSTITUTIONAL:  well-appearing, NAD NEURO:  Alert and oriented x 3, CN 3-12 grossly intact EYES:  eyes equal and reactive ENT/NECK:  Supple, no stridor  CARDIO:  normal rate, regular rhythm, appears well-perfused  PULM:  No respiratory distress, CTAB GI/GU:  non-distended,  MSK/SPINE:  No gross deformities, no edema, moves all extremities  SKIN:  no rash, atraumatic   *Additional and/or pertinent findings included in MDM below  Diagnostic and Interventional Summary    EKG Interpretation  Date/Time:    Ventricular Rate:    PR Interval:    QRS Duration:   QT Interval:    QTC Calculation:   R Axis:     Text Interpretation:         Labs Reviewed  COMPREHENSIVE METABOLIC PANEL - Abnormal; Notable for the following components:      Result Value   Glucose, Bld 111 (*)    All other components within normal limits  CBC WITH DIFFERENTIAL/PLATELET - Abnormal; Notable for the following components:   WBC 18.8 (*)     Hemoglobin 11.8 (*)    Platelets 451 (*)    Neutro Abs 14.6 (*)    Monocytes Absolute 1.3 (*)    All other components within normal limits  URINALYSIS, ROUTINE W REFLEX MICROSCOPIC  LIPASE, BLOOD  PREGNANCY, URINE  POC URINE PREG, ED    US Pelvis Complete  Final Result    US Transvaginal Non-OB  Final Result    [place] Art/Ven Flow Abd Pelv Doppler  Final Result    CT ABDOMEN PELVIS W CONTRAST    (Results Pending)    Medications - No data to display   Procedures  /  Critical Care Procedures  ED Course and Medical Decision Making  I have reviewed the triage vital signs, the nursing notes, and pertinent available records from the EMR.  Social Determinants Affecting Complexity of Care: Patient has no clinically significant social determinants affecting this chief complaint..   ED Course:    Medical Decision Making Patient here with right-sided adnexal pain.  Was sudden onset earlier tonight.  She states her pain has improved.  She does not have focal tenderness on exam.  Labs discussed below.  Ultrasound to rule out torsion is negative.  Will check CT due to leukocytosis.  Amount and/or Complexity of Data Reviewed Labs: ordered.    Details: Leukocytosis to 18.8, left shift, uncertain etiology, with  reassuring ultrasound, will check CT Urinalysis normal Urine pregnancy test normal Radiology: ordered and independent interpretation performed.    Details: Normal Doppler flow to bilateral ovaries     Consultants:    Treatment and Plan: Patient signed out to oncoming team at shift change.   Plan: F/u on CT    Final Clinical Impressions(s) / ED Diagnoses  No diagnosis found.  ED Discharge Orders     None         Discharge Instructions Discussed with and Provided to Patient:   Discharge Instructions   None      Roxy Horseman, Cordelia Poche 02/28/23 4098    Tilden Fossa, MD 02/28/23 [phone]

## 2023-03-01 ENCOUNTER — Ambulatory Visit: Payer: Medicaid Other | Admitting: Internal Medicine

## 2023-03-01 ENCOUNTER — Encounter: Payer: Self-pay | Admitting: Internal Medicine

## 2023-03-01 VITALS — BP 110/78 | HR 85 | Temp 98.5°F | Ht 64.0 in | Wt 209.5 lb

## 2023-03-01 DIAGNOSIS — R1031 Right lower quadrant pain: Secondary | ICD-10-CM | POA: Diagnosis not present

## 2023-03-01 DIAGNOSIS — Z6835 Body mass index (BMI) 35.0-35.9, adult: Secondary | ICD-10-CM | POA: Diagnosis not present

## 2023-03-01 DIAGNOSIS — D72829 Elevated white blood cell count, unspecified: Secondary | ICD-10-CM | POA: Diagnosis not present

## 2023-03-01 DIAGNOSIS — E6609 Other obesity due to excess calories: Secondary | ICD-10-CM

## 2023-03-01 NOTE — Progress Notes (Signed)
New Patient Office Visit     CC/Reason for Visit: Establish care, discuss chronic and acute concerns Previous PCP: None Last Visit: Unknown  HPI: [name] is a 32 y.o. female who is coming in today for the above mentioned reasons. Past Medical History is significant for: Obesity.  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  She has a 35-year-old daughter.  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  Her surgeries have all been related to sports injuries of her hands.  Family history significant for father with diabetes hypertension and hyperlipidemia.  She also has been known to have exercise-induced asthma and carries an inhaler.  She visited the emergency department yesterday due to severe right lower quadrant abdominal pain.  She had a transvaginal ultrasound, a pelvic Doppler as well as a CT scan all without findings.  She was noted to have a white blood cell count of 18,000.  She has not had any diarrhea or vomiting.   Past Medical/Surgical History: Past Medical History:  Diagnosis Date   Anemia    Anxiety    Asthma    exercise induced   Complication of anesthesia    Head injury, closed    scalp laceration, dizzy for a few minutes   History of chlamydia    PONV (postoperative nausea and vomiting)    Von Willebrand disease (HCC)     Past Surgical History:  Procedure Laterality Date   HAND SURGERY Left 2009   thumb , fracture   HAND SURGERY Left 2006   Pins placed   HARDWARE REMOVAL Left 02/10/2016   Procedure: LEFT SMALL FINGER DEEP IMPLANT REMOVAL;  Surgeon: Bradly Bienenstock, MD;  Location: [facility] OR;  Service: Orthopedics;  Laterality: Left;   OPEN REDUCTION INTERNAL FIXATION (ORIF) PROXIMAL PHALANX Left 01/06/2016   Procedure: OPEN REDUCTION INTERNAL FIXATION (ORIF) LEFT SMALL FINGER;  Surgeon: Bradly Bienenstock, MD;  Location: [facility] OR;  Service: Orthopedics;  Laterality:  Left;    Social History:  reports that she has never smoked. She has never used smokeless tobacco. She reports current alcohol use. She reports that she does not use drugs.  Allergies: Allergies  Allergen Reactions   Bee Venom Anaphylaxis   Tapentadol Itching, Nausea And Vomiting and Rash   Adhesive [Tape] Itching and Rash    Family History:  Family History  Problem Relation Age of Onset   Hyperlipidemia Father    Diabetes Father    Hypertension Father    Hypercholesterolemia Father    Heart disease Father    Breast cancer Paternal Grandmother    Cancer Paternal Grandmother    Cancer Paternal Grandfather      Current Outpatient Medications:    albuterol (VENTOLIN HFA) 108 (90 Base) MCG/ACT inhaler, Inhale 1-2 puffs into the lungs every 4 (four) hours as needed for wheezing or shortness of breath. (Patient taking differently: Inhale 2 puffs into the lungs as needed for wheezing or shortness of breath.), Disp: 18 g, Rfl: 2   cetirizine (ZYRTEC ALLERGY) 10 MG tablet, Take 1 tablet (10 mg total) by mouth daily., Disp: 30 tablet, Rfl: 0   EPINEPHrine 0.3 mg/0.3 mL IJ SOAJ injection, Inject 0.3 mLs (0.3 mg total) into the muscle once as needed for up to 1 dose. As needed for bee stings, Disp: 1 each, Rfl: 2   fluticasone (FLONASE) 50 MCG/ACT nasal spray, Place 1 spray  into both nostrils daily., Disp: 15.8 mL, Rfl: 0   hydrOXYzine (ATARAX) 25 MG tablet, Take 25-50 mg by mouth every 6 (six) hours as needed., Disp: , Rfl:    Multiple Vitamin (MULTIVITAMIN PO), Take by mouth. W/ Iron, Disp: , Rfl:    pseudoephedrine (SUDAFED) 60 MG tablet, Take 1 tablet (60 mg total) by mouth every 8 (eight) hours as needed for up to 7 days for congestion., Disp: 20 tablet, Rfl: 0  Review of Systems:  Negative except as indicated in HPI.   Physical Exam: Vitals:   03/01/23 1430  BP: 110/78  Pulse: 85  Temp: 98.5 F (36.9 C)  TempSrc: Oral  SpO2: 98%  Weight: 209 lb 8 oz (95 kg)  Height: 5\' 4"   (1.626 m)   Body mass index is 35.96 kg/m.  Physical Exam Vitals reviewed.  Constitutional:      Appearance: Normal appearance.  HENT:     Head: Normocephalic and atraumatic.  Eyes:     Conjunctiva/sclera: Conjunctivae normal.     Pupils: Pupils are equal, round, and reactive to light.  Cardiovascular:     Rate and Rhythm: Normal rate and regular rhythm.  Pulmonary:     Effort: Pulmonary effort is normal.     Breath sounds: Normal breath sounds.  Abdominal:     General: Abdomen is flat. Bowel sounds are normal.     Palpations: Abdomen is soft.  Skin:    General: Skin is warm and dry.  Neurological:     General: No focal deficit present.     Mental Status: She is alert and oriented to person, place, and time.  Psychiatric:        Mood and Affect: Mood normal.        Behavior: Behavior normal.        Thought Content: Thought content normal.        Judgment: Judgment normal.       Impression and Plan:  RLQ abdominal pain  Class 2 obesity due to excess calories without serious comorbidity with body mass index (BMI) of 35.0 to 35.9 in adult  Leukocytosis, unspecified type  -Discussed healthy lifestyle, including increased physical activity and better food choices to promote weight loss. -She has chronic leukocytosis in the 12-14 range, however yesterday it was 18,000.  She did not have ovarian torsion, ovarian cyst or appendicitis based on imaging performed yesterday. -Repeat WBC count in 6 to 8 weeks to ensure it is trending down. -She will schedule follow-up visit for CPE.  Spent 32 minutes on today's visit reviewing chart, interviewing and examining patient and formulating plan of care.  She will return in 6 weeks to repeat WBC count to make sure it is trending down and for preventative health care.        Chaya Jan, MD [facility]

## 2023-03-03 ENCOUNTER — Ambulatory Visit: Payer: Medicaid Other | Admitting: Physical Therapy

## 2023-03-08 ENCOUNTER — Other Ambulatory Visit: Payer: Self-pay | Admitting: *Deleted

## 2023-03-08 ENCOUNTER — Ambulatory Visit: Payer: Medicaid Other | Admitting: Radiology

## 2023-03-08 VITALS — BP 102/64 | Temp 98.4°F

## 2023-03-08 DIAGNOSIS — R590 Localized enlarged lymph nodes: Secondary | ICD-10-CM

## 2023-03-08 LAB — WET PREP FOR TRICH, YEAST, CLUE

## 2023-03-08 MED ORDER — DOXYCYCLINE HYCLATE 100 MG PO CAPS
100.0000 mg | ORAL_CAPSULE | Freq: Two times a day (BID) | ORAL | 0 refills | Status: AC
Start: 2023-03-08 — End: ?

## 2023-03-08 NOTE — Addendum Note (Signed)
Addended by: Arlie Solomons on: 03/08/2023 01:31 PM   Modules accepted: Orders

## 2023-03-08 NOTE — Addendum Note (Signed)
Addended by: Arlie Solomons on: 03/08/2023 10:56 AM   Modules accepted: Orders

## 2023-03-08 NOTE — Progress Notes (Signed)
      Subjective: [name] is a 32 y.o. female who complains of swelling/pain right groin area and swelling of right labia. Symptoms began 1 week--evaluated at ED 02/28/23 and PCP 03/01/23. Symptoms have somewhat improved today. No urinary sx or vaginal sx.     Review of Systems  All other systems reviewed and are negative.   Past Medical History:  Diagnosis Date   Anemia    Anxiety    Asthma    exercise induced   Complication of anesthesia    Head injury, closed    scalp laceration, dizzy for a few minutes   History of chlamydia    PONV (postoperative nausea and vomiting)    Von Willebrand disease (HCC)       Objective:  Today's Vitals   03/08/23 1030  BP: 102/64  Temp: 98.4 F (36.9 C)  TempSrc: Oral   There is no height or weight on file to calculate BMI.   -General: no acute distress -Vulva: without lesions or discharge -Vagina: discharge present, aptima swab and wet prep obtained -Cervix: no lesion or discharge, no CMT -Perineum: no lesions -Uterus: Mobile, non tender -Adnexa: no masses or tenderness -Groin: lymphadenopathy right inguinal node enlarged to 1cm and tender   Microscopic wet-mount exam shows negative for pathogens, normal epithelial cells.   Raynelle Fanning, CMA present for exam  Assessment:/Plan:   1. Lymphadenopathy, inguinal Warning signs reviewed - doxycycline (VIBRAMYCIN) 100 MG capsule; Take 1 capsule (100 mg total) by mouth 2 (two) times daily.  Dispense: 20 capsule; Refill: 0    Will contact patient with results of testing completed today. Avoid intercourse until symptoms are resolved. Safe sex encouraged. Avoid the use of soaps or perfumed products in the peri area. Avoid tub baths and sitting in sweaty or wet clothing for prolonged periods of time.

## 2023-03-09 LAB — SURESWAB CT/NG/T. VAGINALIS
C. trachomatis RNA, TMA: NOT DETECTED
N. gonorrhoeae RNA, TMA: NOT DETECTED
Trichomonas vaginalis RNA: NOT DETECTED

## 2023-03-10 ENCOUNTER — Ambulatory Visit: Payer: Medicaid Other | Admitting: Physical Therapy

## 2023-03-17 ENCOUNTER — Encounter: Payer: Medicaid Other | Admitting: Physical Therapy

## 2023-03-24 ENCOUNTER — Encounter: Payer: Medicaid Other | Admitting: Podiatry

## 2023-03-26 NOTE — Progress Notes (Signed)
This encounter was created in error - please disregard.  Patient was a no show for appt today. 

## 2023-04-19 ENCOUNTER — Ambulatory Visit: Payer: Medicaid Other | Admitting: Internal Medicine

## 2023-06-07 ENCOUNTER — Ambulatory Visit: Payer: Medicaid Other | Admitting: Radiology
